# Patient Record
Sex: Male | Born: 1955
Health system: Southern US, Community
[De-identification: ages and names within clinical notes are randomized; demographics above are authoritative.]

## PROBLEM LIST (undated history)

## (undated) DIAGNOSIS — K2270 Barrett's esophagus without dysplasia: Secondary | ICD-10-CM

## (undated) DIAGNOSIS — I10 Essential (primary) hypertension: Secondary | ICD-10-CM

## (undated) DIAGNOSIS — Z973 Presence of spectacles and contact lenses: Secondary | ICD-10-CM

## (undated) DIAGNOSIS — M898X9 Other specified disorders of bone, unspecified site: Secondary | ICD-10-CM

## (undated) DIAGNOSIS — C8230 Follicular lymphoma grade IIIa, unspecified site: Secondary | ICD-10-CM

## (undated) DIAGNOSIS — I2510 Atherosclerotic heart disease of native coronary artery without angina pectoris: Secondary | ICD-10-CM

## (undated) DIAGNOSIS — L570 Actinic keratosis: Secondary | ICD-10-CM

## (undated) DIAGNOSIS — G4700 Insomnia, unspecified: Secondary | ICD-10-CM

## (undated) DIAGNOSIS — R5383 Other fatigue: Secondary | ICD-10-CM

## (undated) DIAGNOSIS — R112 Nausea with vomiting, unspecified: Secondary | ICD-10-CM

## (undated) DIAGNOSIS — G971 Other reaction to spinal and lumbar puncture: Secondary | ICD-10-CM

## (undated) DIAGNOSIS — C801 Malignant (primary) neoplasm, unspecified: Secondary | ICD-10-CM

## (undated) DIAGNOSIS — K219 Gastro-esophageal reflux disease without esophagitis: Secondary | ICD-10-CM

## (undated) DIAGNOSIS — R51 Headache: Secondary | ICD-10-CM

## (undated) DIAGNOSIS — K5900 Constipation, unspecified: Secondary | ICD-10-CM

## (undated) HISTORY — DX: Other specified disorders of bone, unspecified site: M89.8X9

## (undated) HISTORY — DX: Follicular lymphoma grade IIIa, unspecified site: C82.30

## (undated) HISTORY — DX: Malignant (primary) neoplasm, unspecified: C80.1

## (undated) HISTORY — DX: Other reaction to spinal and lumbar puncture: G97.1

## (undated) HISTORY — DX: Headache: R51

## (undated) HISTORY — DX: Barrett's esophagus without dysplasia: K22.70

## (undated) HISTORY — DX: Nausea with vomiting, unspecified: R11.2

## (undated) HISTORY — PX: COLONOSCOPY: SHX174

## (undated) HISTORY — PX: APPENDECTOMY: SHX54

## (undated) HISTORY — DX: Constipation, unspecified: K59.00

## (undated) HISTORY — DX: Other fatigue: R53.83

## (undated) HISTORY — DX: Actinic keratosis: L57.0

## (undated) HISTORY — DX: Insomnia, unspecified: G47.00

## (undated) HISTORY — DX: Essential (primary) hypertension: I10

## (undated) HISTORY — DX: Atherosclerotic heart disease of native coronary artery without angina pectoris: I25.10

---

## 1999-06-26 ENCOUNTER — Emergency Department (HOSPITAL_COMMUNITY): Admission: EM | Admit: 1999-06-26 | Discharge: 1999-06-26 | Payer: Self-pay | Admitting: Emergency Medicine

## 2000-06-16 ENCOUNTER — Ambulatory Visit (HOSPITAL_COMMUNITY): Admission: RE | Admit: 2000-06-16 | Discharge: 2000-06-16 | Payer: Self-pay | Admitting: *Deleted

## 2011-10-07 ENCOUNTER — Emergency Department (HOSPITAL_COMMUNITY): Payer: No Typology Code available for payment source

## 2011-10-07 ENCOUNTER — Encounter: Payer: Self-pay | Admitting: Emergency Medicine

## 2011-10-07 ENCOUNTER — Other Ambulatory Visit: Payer: Self-pay

## 2011-10-07 ENCOUNTER — Emergency Department (HOSPITAL_COMMUNITY)
Admission: EM | Admit: 2011-10-07 | Discharge: 2011-10-07 | Disposition: A | Discharge status: Home / Self Care | Payer: No Typology Code available for payment source | Attending: Emergency Medicine | Admitting: Emergency Medicine

## 2011-10-07 DIAGNOSIS — R071 Chest pain on breathing: Secondary | ICD-10-CM | POA: Insufficient documentation

## 2011-10-07 DIAGNOSIS — S2239XA Fracture of one rib, unspecified side, initial encounter for closed fracture: Secondary | ICD-10-CM

## 2011-10-07 MED ORDER — IOHEXOL 300 MG/ML  SOLN
80.0000 mL | Freq: Once | INTRAMUSCULAR | Status: AC | PRN
  Administered 2011-10-07: 80 mL via INTRAVENOUS

## 2011-10-07 MED ORDER — OXYCODONE-ACETAMINOPHEN 5-325 MG PO TABS
ORAL_TABLET | ORAL | Status: AC
  Filled 2011-10-07: qty 1

## 2011-10-07 MED ORDER — OXYCODONE-ACETAMINOPHEN 5-325 MG PO TABS: 1.0000 | ORAL_TABLET | ORAL | Status: AC | PRN

## 2011-10-07 MED ORDER — IBUPROFEN 600 MG PO TABS: 600.0000 mg | ORAL_TABLET | Freq: Four times a day (QID) | ORAL | Status: AC | PRN

## 2011-10-07 MED ORDER — MORPHINE SULFATE 4 MG/ML IJ SOLN: 4.0000 mg | Freq: Once | INTRAMUSCULAR | Status: DC

## 2011-10-07 MED ORDER — OXYCODONE-ACETAMINOPHEN 5-325 MG PO TABS
1.0000 | ORAL_TABLET | Freq: Once | ORAL | Status: AC
  Administered 2011-10-07: 1 via ORAL

## 2011-10-07 NOTE — ED Notes (Signed)
Pt completed incentive spirometry correctly. Education given on same.  Pt also ambulated without difficulty in hallway.

## 2011-10-07 NOTE — ED Notes (Signed)
MD at bedside. 

## 2011-10-07 NOTE — ED Notes (Signed)
Pt returned from cat scan in no acute distress.
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
## 2011-10-07 NOTE — ED Notes (Signed)
Patient transported to CT 

## 2011-10-07 NOTE — ED Notes (Signed)
Ambulatory to check out desk with wife in no acute distress.
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
## 2011-10-07 NOTE — ED Notes (Signed)
Pt denies needs at this time. Aware of plan of care.  Resp are equal and unlabored. Family at bedside.  Awaiting consult.

## 2011-10-08 NOTE — ED Provider Notes (Signed)
I saw and evaluated the patient, reviewed the resident's note and I agree with the findings and plan.   Date: 10/08/2011  Rate: 78  Rhythm: normal sinus rhythm  QRS Axis: normal  Intervals: normal  ST/T Wave abnormalities: normal  Conduction Disutrbances:none  Narrative Interpretation:   Old EKG Reviewed: none available  +Lt cw ttp. No shortness of breath at rest or with ambulation. His oxygenation is 96-100% on room air. Patient has been given an incentive spirometer, pain medication. He is feeling better and ready for discharge home. Due to his slightly abnormal CT scan I did discuss with the trauma surgeon on call. Patient to be discharged.   Forbes Cellar, MD 10/08/11 [phone]

## 2014-05-09 ENCOUNTER — Other Ambulatory Visit: Payer: Self-pay | Admitting: Family Medicine

## 2014-05-09 DIAGNOSIS — R222 Localized swelling, mass and lump, trunk: Secondary | ICD-10-CM

## 2014-05-09 DIAGNOSIS — R221 Localized swelling, mass and lump, neck: Principal | ICD-10-CM

## 2014-05-09 DIAGNOSIS — R220 Localized swelling, mass and lump, head: Secondary | ICD-10-CM

## 2014-05-13 ENCOUNTER — Ambulatory Visit
Admission: RE | Admit: 2014-05-13 | Discharge: 2014-05-13 | Disposition: A | Discharge status: Home / Self Care | Payer: BC Managed Care – PPO | Source: Ambulatory Visit | Attending: Family Medicine | Admitting: Family Medicine

## 2014-05-13 DIAGNOSIS — R220 Localized swelling, mass and lump, head: Secondary | ICD-10-CM

## 2014-05-13 DIAGNOSIS — R221 Localized swelling, mass and lump, neck: Principal | ICD-10-CM

## 2014-05-13 DIAGNOSIS — R222 Localized swelling, mass and lump, trunk: Secondary | ICD-10-CM

## 2014-05-13 MED ORDER — IOHEXOL 300 MG/ML  SOLN
100.0000 mL | Freq: Once | INTRAMUSCULAR | Status: AC | PRN
  Administered 2014-05-13: 100 mL via INTRAVENOUS

## 2014-05-14 ENCOUNTER — Other Ambulatory Visit (HOSPITAL_COMMUNITY): Payer: Self-pay | Admitting: Family Medicine

## 2014-05-14 DIAGNOSIS — R222 Localized swelling, mass and lump, trunk: Secondary | ICD-10-CM

## 2014-05-16 ENCOUNTER — Other Ambulatory Visit: Payer: Self-pay | Admitting: Radiology

## 2014-05-19 ENCOUNTER — Encounter (HOSPITAL_COMMUNITY): Payer: Self-pay | Admitting: Pharmacy Technician

## 2014-05-19 ENCOUNTER — Encounter (HOSPITAL_COMMUNITY): Payer: Self-pay

## 2014-05-19 ENCOUNTER — Ambulatory Visit (HOSPITAL_COMMUNITY)
Admission: RE | Admit: 2014-05-19 | Discharge: 2014-05-19 | Disposition: A | Discharge status: Home / Self Care | Payer: BC Managed Care – PPO | Source: Ambulatory Visit | Attending: Family Medicine | Admitting: Family Medicine

## 2014-05-19 DIAGNOSIS — R222 Localized swelling, mass and lump, trunk: Secondary | ICD-10-CM | POA: Diagnosis present

## 2014-05-19 LAB — CBC
HCT: 43.1 % (ref 39.0–52.0)
Hemoglobin: 14.4 g/dL (ref 13.0–17.0)
MCH: 30.2 pg (ref 26.0–34.0)
MCHC: 33.4 g/dL (ref 30.0–36.0)
MCV: 90.4 fL (ref 78.0–100.0)
Platelets: 311 10*3/uL (ref 150–400)
RBC: 4.77 MIL/uL (ref 4.22–5.81)
RDW: 12.1 % (ref 11.5–15.5)
WBC: 6.3 10*3/uL (ref 4.0–10.5)

## 2014-05-19 LAB — PROTIME-INR
INR: 1.07 (ref 0.00–1.49)
Prothrombin Time: 13.9 seconds (ref 11.6–15.2)

## 2014-05-19 LAB — APTT: aPTT: 29 seconds (ref 24–37)

## 2014-05-19 MED ORDER — FENTANYL CITRATE 0.05 MG/ML IJ SOLN
INTRAMUSCULAR | Status: AC
  Filled 2014-05-19: qty 2

## 2014-05-19 MED ORDER — SODIUM CHLORIDE 0.9 % IV SOLN
INTRAVENOUS | Status: DC
  Administered 2014-05-19: 13:00:00 via INTRAVENOUS

## 2014-05-19 MED ORDER — FENTANYL CITRATE 0.05 MG/ML IJ SOLN
INTRAMUSCULAR | Status: AC | PRN
  Administered 2014-05-19: 50 ug via INTRAVENOUS

## 2014-05-19 MED ORDER — LIDOCAINE HCL (PF) 1 % IJ SOLN
INTRAMUSCULAR | Status: AC
  Filled 2014-05-19: qty 10

## 2014-05-19 MED ORDER — MIDAZOLAM HCL 2 MG/2ML IJ SOLN
INTRAMUSCULAR | Status: AC
  Filled 2014-05-19: qty 2

## 2014-05-19 MED ORDER — MIDAZOLAM HCL 2 MG/2ML IJ SOLN
INTRAMUSCULAR | Status: AC | PRN
  Administered 2014-05-19: 1 mg via INTRAVENOUS

## 2014-05-19 NOTE — Discharge Instructions (Signed)
Wound Care ° °HOME CARE  °· Only take medicine as told by your doctor. °· Clean the wound daily with mild soap and water. °· Change any bandages (dressings) as told by your doctor. °· Put medicated cream and a bandage on the wound as told by your doctor. °· Change the bandage if it gets wet, dirty, or starts to smell. °· Take showers. Do not take baths, swim, or do anything that puts your wound under water. °· Rest and raise (elevate) the wound until the pain and puffiness (swelling) are better. °· Keep all doctor visits as told. °GET HELP RIGHT AWAY IF:  °· Yellowish-white fluid (pus) comes from the wound. °· Medicine does not lessen your pain. °· There is a red streak going away from the wound. °· You have a fever. °MAKE SURE YOU:  °· Understand these instructions. °· Will watch your condition. °· Will get help right away if you are not doing well or get worse. °Document Released: 06/28/2008 Document Revised: 12/12/2011 Document Reviewed: 01/23/2011 °ExitCare® Patient Information ©2015 [employer]. This information is not intended to replace advice given to you by your health care provider. Make sure you discuss any questions you have with your health care provider. ° °

## 2014-05-19 NOTE — H&P (Signed)
Erik Crosby is an 58 y.o. male.   Chief Complaint:  Pt has noticed this area of swelling in Rt neck for almost 1 yr Has been evaluated by MD and CT recommended but had not moved forward with this. Mass is enlarging and has become painful 8/11 CT Neck and Chest shows mediastinal lymphadenopathy consistent with lymphoma with Right neck mass Now scheduled for biopsy of Rt neck mass  HPI: none  History reviewed. No pertinent past medical history.  Past Surgical History  Procedure Laterality Date  . Appendectomy      No family history on file. Social History:  has no tobacco, alcohol, and drug history on file.  Allergies: No Known Allergies   (Not in a hospital admission)  Results for orders placed during the hospital encounter of 05/19/14 (from the past 48 hour(s))  APTT     Status: None   Collection Time    05/19/14 12:42 PM      Result Value Ref Range   aPTT 29  24 - 37 seconds  CBC     Status: None   Collection Time    05/19/14 12:42 PM      Result Value Ref Range   WBC 6.3  4.0 - 10.5 K/uL   RBC 4.77  4.22 - 5.81 MIL/uL   Hemoglobin 14.4  13.0 - 17.0 g/dL   HCT 43.1  39.0 - 52.0 %   MCV 90.4  78.0 - 100.0 fL   MCH 30.2  26.0 - 34.0 pg   MCHC 33.4  30.0 - 36.0 g/dL   RDW 12.1  11.5 - 15.5 %   Platelets 311  150 - 400 K/uL  PROTIME-INR     Status: None   Collection Time    05/19/14 12:42 PM      Result Value Ref Range   Prothrombin Time 13.9  11.6 - 15.2 seconds   INR 1.07  0.00 - 1.49   No results found.  Review of Systems  Constitutional: Negative for fever, chills and weight loss.  Respiratory: Negative for shortness of breath.   Cardiovascular: Negative for chest pain.  Gastrointestinal: Negative for nausea, vomiting and abdominal pain.  Musculoskeletal: Positive for back pain and neck pain.  Neurological: Negative for dizziness, weakness and headaches.  Psychiatric/Behavioral: Negative for substance abuse.    Blood pressure 152/89, pulse  98, temperature 98.1 F (36.7 C), temperature source Oral, resp. rate 18, height 5\' 10"  (1.778 m), weight 77.565 kg (171 lb), SpO2 96.00%. Physical Exam  Constitutional: He is oriented to person, place, and time. He appears well-nourished.  Neck: Normal range of motion. Neck supple.  B palpable cervical nodes Large Rt sided neck mass Tender to palpate  Cardiovascular: Normal rate and regular rhythm.   No murmur heard. Respiratory: Effort normal and breath sounds normal. He has no wheezes.  GI: Soft. Bowel sounds are normal. There is no tenderness.  Musculoskeletal: Normal range of motion.  Lymphadenopathy:    He has cervical adenopathy.  Neurological: He is alert and oriented to person, place, and time.  Skin: Skin is warm and dry.  Psychiatric: He has a normal mood and affect. His behavior is normal. Judgment and thought content normal.     Assessment/Plan Rt neck mass - enlarging x 1 yr Now painful Abn CT chest and neck 05/13/14 Scheduled for biopsy of Rt neck mass Pt and wife aware of procedure benefits and risks and agreeable to proceed Consent signed and in chart  [place]  05/19/2014, 1:20 PM

## 2014-05-19 NOTE — H&P (Signed)
For biopsy of right neck mass today.

## 2014-05-19 NOTE — Procedures (Signed)
Procedure:  Right supraclavicular mass biopsy Findings:  Right [place] mass localized by ultrasound.  18 G core biopsy x 6.  Material submitted in saline. No complications.

## 2014-05-26 ENCOUNTER — Encounter (INDEPENDENT_AMBULATORY_CARE_PROVIDER_SITE_OTHER): Payer: Self-pay | Admitting: General Surgery

## 2014-05-26 ENCOUNTER — Ambulatory Visit (INDEPENDENT_AMBULATORY_CARE_PROVIDER_SITE_OTHER): Payer: BC Managed Care – PPO | Admitting: General Surgery

## 2014-05-26 VITALS — BP 148/84 | HR 81 | Temp 97.4°F | Ht 70.0 in | Wt 167.0 lb

## 2014-05-26 DIAGNOSIS — R591 Generalized enlarged lymph nodes: Secondary | ICD-10-CM

## 2014-05-26 DIAGNOSIS — R599 Enlarged lymph nodes, unspecified: Secondary | ICD-10-CM

## 2014-05-26 NOTE — Patient Instructions (Signed)
My office will call you to schedule the surgery.

## 2014-05-26 NOTE — Progress Notes (Signed)
Patient ID: Erik Crosby, male   DOB: 1956/05/23, 58 y.o.   MRN: [id number]  No chief complaint on file.   HPI Erik Crosby is a 58 y.o. male.   HPI  He is referred by Dr. Redmond Pulling because of lymphadenopathy. About one year ago, he noticed a small nodule in the right neck. His become significantly larger. CT scan has demonstrated bilateral supraclavicular as well his mediastinal adenopathy. A large core needle biopsy was performed but only necrotic tissue was noted.  He reports having night sweats as well decreased energy.  History reviewed. No pertinent past medical history.  Past Surgical History  Procedure Laterality Date  . Appendectomy      Family History  Problem Relation Age of Onset  . Cancer Mother   . Heart disease Father     Social History History  Substance Use Topics  . Smoking status: Never Smoker   . Smokeless tobacco: Not on file  . Alcohol Use: No    No Known Allergies  Current Outpatient Prescriptions  Medication Sig Dispense Refill  . ibuprofen (ADVIL,MOTRIN) 200 MG tablet Take 400 mg by mouth every 6 (six) hours as needed for headache or moderate pain.      . Probiotic Product (PROBIOTIC DAILY PO) Take 1 capsule by mouth daily.      . Turmeric 500 MG CAPS Take 500 mg by mouth daily.       No current facility-administered medications for this visit.    Review of Systems Review of Systems  Constitutional: Positive for fever and chills.  Respiratory: Positive for cough.   Cardiovascular: Positive for chest pain.  Neurological: Positive for weakness.  Hematological: Positive for adenopathy.    Blood pressure 148/84, pulse 81, temperature 97.4 F (36.3 C), height 5\' 10"  (1.778 m), weight 167 lb (75.751 kg).  Physical Exam Physical Exam  Constitutional: He appears well-developed and well-nourished. No distress.  HENT:  Head: Normocephalic and atraumatic.  Neck:  5 cm right supraclavicular mass with some surrounding ecchymosis.   Cardiovascular:  Increased rate  Pulmonary/Chest: Effort normal and breath sounds normal.  Abdominal: Soft. He exhibits no distension and no mass.  Musculoskeletal:  No axillary or inguinal adenopathy  Neurological: He is alert.  Skin: Skin is warm and dry.    Data Reviewed Note from radiology. Pathology report.  Assessment    Neck adenopathy. This as well as his clinical symptoms are suspicious for lymphoma.     Plan    Right supraclavicular lymph node biopsy. The procedure and risks were discussed with him. Risks include but not limited to bleeding, infection, wound healing problems, anesthesia, nerve injury. He seems to understand and would like to proceed.        Lori-Ann Lindfors J 05/26/2014, 10:56 AM

## 2014-06-02 ENCOUNTER — Encounter (HOSPITAL_BASED_OUTPATIENT_CLINIC_OR_DEPARTMENT_OTHER): Payer: Self-pay | Admitting: *Deleted

## 2014-06-02 ENCOUNTER — Encounter (HOSPITAL_BASED_OUTPATIENT_CLINIC_OR_DEPARTMENT_OTHER)
Admission: RE | Admit: 2014-06-02 | Discharge: 2014-06-02 | Disposition: A | Discharge status: Home / Self Care | Payer: BC Managed Care – PPO | Source: Ambulatory Visit | Attending: General Surgery | Admitting: General Surgery

## 2014-06-02 DIAGNOSIS — Z01818 Encounter for other preprocedural examination: Secondary | ICD-10-CM | POA: Insufficient documentation

## 2014-06-02 DIAGNOSIS — R599 Enlarged lymph nodes, unspecified: Secondary | ICD-10-CM | POA: Insufficient documentation

## 2014-06-02 LAB — CBC WITH DIFFERENTIAL/PLATELET
Basophils Absolute: 0 10*3/uL (ref 0.0–0.1)
Basophils Relative: 1 % (ref 0–1)
Eosinophils Absolute: 0.2 10*3/uL (ref 0.0–0.7)
Eosinophils Relative: 3 % (ref 0–5)
HCT: 42.4 % (ref 39.0–52.0)
Hemoglobin: 14.5 g/dL (ref 13.0–17.0)
Lymphocytes Relative: 17 % (ref 12–46)
Lymphs Abs: 0.9 10*3/uL (ref 0.7–4.0)
MCH: 29.9 pg (ref 26.0–34.0)
MCHC: 34.2 g/dL (ref 30.0–36.0)
MCV: 87.4 fL (ref 78.0–100.0)
Monocytes Absolute: 0.5 10*3/uL (ref 0.1–1.0)
Monocytes Relative: 9 % (ref 3–12)
Neutro Abs: 3.7 10*3/uL (ref 1.7–7.7)
Neutrophils Relative %: 70 % (ref 43–77)
Platelets: 203 10*3/uL (ref 150–400)
RBC: 4.85 MIL/uL (ref 4.22–5.81)
RDW: 12.5 % (ref 11.5–15.5)
WBC: 5.3 10*3/uL (ref 4.0–10.5)

## 2014-06-02 LAB — COMPREHENSIVE METABOLIC PANEL
ALT: 27 U/L (ref 0–53)
AST: 23 U/L (ref 0–37)
Albumin: 3.9 g/dL (ref 3.5–5.2)
Alkaline Phosphatase: 75 U/L (ref 39–117)
Anion gap: 12 (ref 5–15)
BUN: 12 mg/dL (ref 6–23)
CO2: 28 mEq/L (ref 19–32)
Calcium: 9.5 mg/dL (ref 8.4–10.5)
Chloride: 98 mEq/L (ref 96–112)
Creatinine, Ser: 0.97 mg/dL (ref 0.50–1.35)
GFR calc Af Amer: >90 mL/min (ref >90)
GFR calc non Af Amer: 90 mL/min — ABNORMAL LOW (ref >90)
Glucose, Bld: 85 mg/dL (ref 70–99)
Potassium: 4.4 mEq/L (ref 3.7–5.3)
Sodium: 138 mEq/L (ref 137–147)
Total Bilirubin: 0.3 mg/dL (ref 0.3–1.2)
Total Protein: 7.3 g/dL (ref 6.0–8.3)

## 2014-06-02 NOTE — Progress Notes (Signed)
Pt had cbc no diff-and pt-will come in for cmp cbc diff

## 2014-06-03 ENCOUNTER — Encounter (HOSPITAL_BASED_OUTPATIENT_CLINIC_OR_DEPARTMENT_OTHER): Payer: BC Managed Care – PPO | Admitting: Anesthesiology

## 2014-06-03 ENCOUNTER — Ambulatory Visit (HOSPITAL_BASED_OUTPATIENT_CLINIC_OR_DEPARTMENT_OTHER)
Admission: RE | Admit: 2014-06-03 | Discharge: 2014-06-03 | Disposition: A | Discharge status: Home / Self Care | Payer: BC Managed Care – PPO | Source: Ambulatory Visit | Attending: General Surgery | Admitting: General Surgery

## 2014-06-03 ENCOUNTER — Encounter (HOSPITAL_BASED_OUTPATIENT_CLINIC_OR_DEPARTMENT_OTHER)
Admission: RE | Disposition: A | Discharge status: Home / Self Care | Payer: Self-pay | Source: Ambulatory Visit | Attending: General Surgery

## 2014-06-03 ENCOUNTER — Ambulatory Visit (HOSPITAL_BASED_OUTPATIENT_CLINIC_OR_DEPARTMENT_OTHER): Payer: BC Managed Care – PPO | Admitting: Anesthesiology

## 2014-06-03 ENCOUNTER — Encounter (HOSPITAL_BASED_OUTPATIENT_CLINIC_OR_DEPARTMENT_OTHER): Payer: Self-pay | Admitting: Anesthesiology

## 2014-06-03 DIAGNOSIS — C8589 Other specified types of non-Hodgkin lymphoma, extranodal and solid organ sites: Secondary | ICD-10-CM | POA: Diagnosis not present

## 2014-06-03 DIAGNOSIS — R599 Enlarged lymph nodes, unspecified: Secondary | ICD-10-CM | POA: Diagnosis present

## 2014-06-03 HISTORY — DX: Presence of spectacles and contact lenses: Z97.3

## 2014-06-03 HISTORY — PX: LYMPH NODE BIOPSY: SHX201

## 2014-06-03 HISTORY — DX: Gastro-esophageal reflux disease without esophagitis: K21.9

## 2014-06-03 LAB — POCT HEMOGLOBIN-HEMACUE: Hemoglobin: 15.6 g/dL (ref 13.0–17.0)

## 2014-06-03 SURGERY — LYMPH NODE BIOPSY
Anesthesia: General | Laterality: Right

## 2014-06-03 MED ORDER — CEFAZOLIN SODIUM-DEXTROSE 2-3 GM-% IV SOLR
INTRAVENOUS | Status: AC
  Filled 2014-06-03: qty 50

## 2014-06-03 MED ORDER — FENTANYL CITRATE 0.05 MG/ML IJ SOLN
25.0000 ug | INTRAMUSCULAR | Status: DC | PRN
  Administered 2014-06-03: 50 ug via INTRAVENOUS
  Administered 2014-06-03: 25 ug via INTRAVENOUS

## 2014-06-03 MED ORDER — FENTANYL CITRATE 0.05 MG/ML IJ SOLN
INTRAMUSCULAR | Status: AC
  Filled 2014-06-03: qty 6

## 2014-06-03 MED ORDER — LIDOCAINE HCL (CARDIAC) 20 MG/ML IV SOLN
INTRAVENOUS | Status: DC | PRN
  Administered 2014-06-03: 50 mg via INTRAVENOUS

## 2014-06-03 MED ORDER — FENTANYL CITRATE 0.05 MG/ML IJ SOLN: 50.0000 ug | INTRAMUSCULAR | Status: DC | PRN

## 2014-06-03 MED ORDER — OXYCODONE HCL 5 MG PO TABS
ORAL_TABLET | ORAL | Status: AC
  Filled 2014-06-03: qty 1

## 2014-06-03 MED ORDER — PROPOFOL 10 MG/ML IV BOLUS
INTRAVENOUS | Status: DC | PRN
  Administered 2014-06-03: 300 mg via INTRAVENOUS

## 2014-06-03 MED ORDER — ONDANSETRON HCL 4 MG/2ML IJ SOLN
INTRAMUSCULAR | Status: DC | PRN
  Administered 2014-06-03: 4 mg via INTRAVENOUS

## 2014-06-03 MED ORDER — BUPIVACAINE HCL (PF) 0.5 % IJ SOLN
INTRAMUSCULAR | Status: AC
  Filled 2014-06-03: qty 30

## 2014-06-03 MED ORDER — OXYCODONE HCL 5 MG/5ML PO SOLN: 5.0000 mg | Freq: Once | ORAL | Status: AC | PRN

## 2014-06-03 MED ORDER — MIDAZOLAM HCL 5 MG/5ML IJ SOLN
INTRAMUSCULAR | Status: DC | PRN
  Administered 2014-06-03: 2 mg via INTRAVENOUS

## 2014-06-03 MED ORDER — LACTATED RINGERS IV SOLN
INTRAVENOUS | Status: DC
  Administered 2014-06-03: 10:00:00 via INTRAVENOUS

## 2014-06-03 MED ORDER — DEXAMETHASONE SODIUM PHOSPHATE 4 MG/ML IJ SOLN
INTRAMUSCULAR | Status: DC | PRN
  Administered 2014-06-03: 10 mg via INTRAVENOUS

## 2014-06-03 MED ORDER — MIDAZOLAM HCL 2 MG/2ML IJ SOLN
INTRAMUSCULAR | Status: AC
  Filled 2014-06-03: qty 2

## 2014-06-03 MED ORDER — PROMETHAZINE HCL 25 MG/ML IJ SOLN: 6.2500 mg | INTRAMUSCULAR | Status: DC | PRN

## 2014-06-03 MED ORDER — OXYCODONE HCL 5 MG PO TABS
5.0000 mg | ORAL_TABLET | Freq: Once | ORAL | Status: AC | PRN
  Administered 2014-06-03: 5 mg via ORAL

## 2014-06-03 MED ORDER — BUPIVACAINE HCL (PF) 0.5 % IJ SOLN
INTRAMUSCULAR | Status: DC | PRN
  Administered 2014-06-03: 3 mL

## 2014-06-03 MED ORDER — HYDROCODONE-ACETAMINOPHEN 5-325 MG PO TABS: 1.0000 | ORAL_TABLET | ORAL | Status: DC | PRN

## 2014-06-03 MED ORDER — FENTANYL CITRATE 0.05 MG/ML IJ SOLN
INTRAMUSCULAR | Status: AC
  Filled 2014-06-03: qty 2

## 2014-06-03 MED ORDER — CEFAZOLIN SODIUM-DEXTROSE 2-3 GM-% IV SOLR
2.0000 g | INTRAVENOUS | Status: AC
  Administered 2014-06-03: 2 g via INTRAVENOUS

## 2014-06-03 MED ORDER — FENTANYL CITRATE 0.05 MG/ML IJ SOLN
INTRAMUSCULAR | Status: DC | PRN
  Administered 2014-06-03: 50 ug via INTRAVENOUS
  Administered 2014-06-03: 100 ug via INTRAVENOUS
  Administered 2014-06-03: 50 ug via INTRAVENOUS

## 2014-06-03 MED ORDER — MIDAZOLAM HCL 2 MG/2ML IJ SOLN: 1.0000 mg | INTRAMUSCULAR | Status: DC | PRN

## 2014-06-03 SURGICAL SUPPLY — 52 items
APPLIER CLIP 9.375 MED OPEN (MISCELLANEOUS) ×2
BENZOIN TINCTURE PRP APPL 2/3 (GAUZE/BANDAGES/DRESSINGS) ×2 IMPLANT
BLADE CLIPPER SURG (BLADE) IMPLANT
BLADE SURG 15 STRL LF DISP TIS (BLADE) ×1 IMPLANT
BLADE SURG 15 STRL SS (BLADE) ×1
CANISTER SUCT 1200ML W/VALVE (MISCELLANEOUS) IMPLANT
CHLORAPREP W/TINT 26ML (MISCELLANEOUS) ×2 IMPLANT
CLEANER CAUTERY TIP 5X5 PAD (MISCELLANEOUS) IMPLANT
CLIP APPLIE 9.375 MED OPEN (MISCELLANEOUS) ×1 IMPLANT
CLIP TI MEDIUM 6 (CLIP) IMPLANT
CLIP TI WIDE RED SMALL 6 (CLIP) IMPLANT
COVER MAYO STAND STRL (DRAPES) ×2 IMPLANT
COVER TABLE BACK 60X90 (DRAPES) ×2 IMPLANT
DECANTER SPIKE VIAL GLASS SM (MISCELLANEOUS) ×2 IMPLANT
DRAIN JP 10F RND SILICONE (MISCELLANEOUS) IMPLANT
DRAPE PED LAPAROTOMY (DRAPES) ×2 IMPLANT
DRAPE UTILITY XL STRL (DRAPES) ×2 IMPLANT
DRSG TEGADERM 4X4.75 (GAUZE/BANDAGES/DRESSINGS) ×2 IMPLANT
ELECT REM PT RETURN 9FT ADLT (ELECTROSURGICAL) ×2
ELECTRODE REM PT RTRN 9FT ADLT (ELECTROSURGICAL) ×1 IMPLANT
EVACUATOR SILICONE 100CC (DRAIN) IMPLANT
GLOVE BIOGEL PI IND STRL 7.0 (GLOVE) ×1 IMPLANT
GLOVE BIOGEL PI IND STRL 8.5 (GLOVE) ×1 IMPLANT
GLOVE BIOGEL PI INDICATOR 7.0 (GLOVE) ×1
GLOVE BIOGEL PI INDICATOR 8.5 (GLOVE) ×1
GLOVE ECLIPSE 7.0 STRL STRAW (GLOVE) ×2 IMPLANT
GLOVE ECLIPSE 8.0 STRL XLNG CF (GLOVE) ×2 IMPLANT
GOWN STRL REUS W/ TWL LRG LVL3 (GOWN DISPOSABLE) ×2 IMPLANT
GOWN STRL REUS W/TWL LRG LVL3 (GOWN DISPOSABLE) ×2
NEEDLE HYPO 25X1 1.5 SAFETY (NEEDLE) ×2 IMPLANT
NS IRRIG 1000ML POUR BTL (IV SOLUTION) IMPLANT
PACK BASIN DAY SURGERY FS (CUSTOM PROCEDURE TRAY) ×2 IMPLANT
PAD CLEANER CAUTERY TIP 5X5 (MISCELLANEOUS)
PENCIL BUTTON HOLSTER BLD 10FT (ELECTRODE) ×2 IMPLANT
SHEET MEDIUM DRAPE 40X70 STRL (DRAPES) IMPLANT
SLEEVE SCD COMPRESS KNEE MED (MISCELLANEOUS) ×2 IMPLANT
SPONGE GAUZE 4X4 12PLY STER LF (GAUZE/BANDAGES/DRESSINGS) ×2 IMPLANT
SPONGE LAP 4X18 X RAY DECT (DISPOSABLE) ×2 IMPLANT
STRIP CLOSURE SKIN 1/2X4 (GAUZE/BANDAGES/DRESSINGS) ×2 IMPLANT
SUT ETHILON 4 0 PC 5 (SUTURE) IMPLANT
SUT MON AB 4-0 PC3 18 (SUTURE) ×2 IMPLANT
SUT VIC AB 3-0 54X BRD REEL (SUTURE) IMPLANT
SUT VIC AB 3-0 BRD 54 (SUTURE)
SUT VIC AB 3-0 SH 27 (SUTURE) ×1
SUT VIC AB 3-0 SH 27X BRD (SUTURE) ×1 IMPLANT
SUT VICRYL 3-0 CR8 SH (SUTURE) IMPLANT
SUT VICRYL 4-0 PS2 18IN ABS (SUTURE) IMPLANT
SYR CONTROL 10ML LL (SYRINGE) ×2 IMPLANT
TOWEL OR 17X24 6PK STRL BLUE (TOWEL DISPOSABLE) ×4 IMPLANT
TOWEL OR NON WOVEN STRL DISP B (DISPOSABLE) ×2 IMPLANT
TUBE CONNECTING 20X1/4 (TUBING) ×2 IMPLANT
YANKAUER SUCT BULB TIP NO VENT (SUCTIONS) IMPLANT

## 2014-06-03 NOTE — Anesthesia Postprocedure Evaluation (Signed)
  Anesthesia Post-op Note  Patient: Erik Crosby  Procedure(s) Performed: Procedure(s): RIGHT SUPRACLAVICULAR LYMPH NODE BIOPSY (Right)  Patient Location: PACU  Anesthesia Type:General  Level of Consciousness: awake, alert  and oriented  Airway and Oxygen Therapy: Patient Spontanous Breathing  Post-op Pain: mild  Post-op Assessment: Post-op Vital signs reviewed  Post-op Vital Signs: Reviewed  Last Vitals:  Filed Vitals:   06/03/14 1215  BP: 157/78  Pulse: 95  Temp:   Resp: 17    Complications: No apparent anesthesia complications

## 2014-06-03 NOTE — H&P (View-Only) (Signed)
Patient ID: Erik Crosby, male   DOB: 1956-04-24, 58 y.o.   MRN: [id number]  No chief complaint on file.   HPI Erik Crosby is a 58 y.o. male.   HPI  Erik Crosby is referred by Dr. Redmond Pulling because of lymphadenopathy. About one year ago, Erik Crosby noticed a small nodule in the right neck. His become significantly larger. CT scan has demonstrated bilateral supraclavicular as well his mediastinal adenopathy. A large core needle biopsy was performed but only necrotic tissue was noted.  Erik Crosby reports having night sweats as well decreased energy.  History reviewed. No pertinent past medical history.  Past Surgical History  Procedure Laterality Date  . Appendectomy      Family History  Problem Relation Age of Onset  . Cancer Mother   . Heart disease Father     Social History History  Substance Use Topics  . Smoking status: Never Smoker   . Smokeless tobacco: Not on file  . Alcohol Use: No    No Known Allergies  Current Outpatient Prescriptions  Medication Sig Dispense Refill  . ibuprofen (ADVIL,MOTRIN) 200 MG tablet Take 400 mg by mouth every 6 (six) hours as needed for headache or moderate pain.      . Probiotic Product (PROBIOTIC DAILY PO) Take 1 capsule by mouth daily.      . Turmeric 500 MG CAPS Take 500 mg by mouth daily.       No current facility-administered medications for this visit.    Review of Systems Review of Systems  Constitutional: Positive for fever and chills.  Respiratory: Positive for cough.   Cardiovascular: Positive for chest pain.  Neurological: Positive for weakness.  Hematological: Positive for adenopathy.    Blood pressure 148/84, pulse 81, temperature 97.4 F (36.3 C), height 5\' 10"  (1.778 m), weight 167 lb (75.751 kg).  Physical Exam Physical Exam  Constitutional: Erik Crosby appears well-developed and well-nourished. No distress.  HENT:  Head: Normocephalic and atraumatic.  Neck:  5 cm right supraclavicular mass with some surrounding ecchymosis.   Cardiovascular:  Increased rate  Pulmonary/Chest: Effort normal and breath sounds normal.  Abdominal: Soft. Erik Crosby exhibits no distension and no mass.  Musculoskeletal:  No axillary or inguinal adenopathy  Neurological: Erik Crosby is alert.  Skin: Skin is warm and dry.    Data Reviewed Note from radiology. Pathology report.  Assessment    Neck adenopathy. This as well as his clinical symptoms are suspicious for lymphoma.     Plan    Right supraclavicular lymph node biopsy. The procedure and risks were discussed with him. Risks include but not limited to bleeding, infection, wound healing problems, anesthesia, nerve injury. Erik Crosby seems to understand and would like to proceed.        Colman Birdwell J 05/26/2014, 10:56 AM

## 2014-06-03 NOTE — Interval H&P Note (Signed)
History and Physical Interval Note:  06/03/2014 10:21 AM  [name]  has presented today for surgery, with the diagnosis of Right Supraclavicular lymphadenopathy  The various methods of treatment have been discussed with the patient and family. After consideration of risks, benefits and other options for treatment, the patient has consented to  Procedure(s): RIGHT SUPRACLAVICULAR LYMPH NODE BIOPSY (Right) as a surgical intervention .  The patient's history has been reviewed, patient examined, no change in status, stable for surgery.  I have reviewed the patient's chart and labs.  Questions were answered to the patient's satisfaction.     Erik Crosby Lenna Sciara

## 2014-06-03 NOTE — Op Note (Signed)
Operative Note  Erik Crosby male 58 y.o. 06/03/2014  PREOPERATIVE DX:  Right supraclavicular adenopathy  POSTOPERATIVE DX:  Same  PROCEDURE:  Right subclavicular lymph node biopsy         Surgeon: Odis Hollingshead   Assistants: none  Anesthesia: General LMA anesthesia  Indications: this is a 58 year old male with aggressive lymphadenopathy in the right supraclavicular region. He also has and adenopathy and mediastinal area. Large core needle biopsy did not give diagnostic tissue. He now presents for open biopsy.    Procedure Detail:  He was seen in the holding area and the right neck marked my initials. He was brought to the operative room placed supine on the operating table and general anesthetic was given. The right neck and chest wall areas were sterilely prepped and draped. A transverse incision was made in the lateral right neck through the skin subcutaneous tissues and first layer of muscle. I then carefully dissected through the sternocleidomastoid muscle. Identified an enlarged lymph node which was removed using electrocautery. There is a larger lymph node which I did an incisional biopsy on using electrocautery. Both of these specimens were placed into saline and sent fresh to pathology. Bleeding was controlled with electrocautery. Once hemostasis was adequate, the subcutaneous tissue and muscle layers were approximated with interrupted 3-0 Vicryl sutures. The skin was closed with a 4-0 Monocryl subcuticular stitch. Steri-Strips and a sterile dressing were applied.  He tolerated the procedure well without any apparent complications and was taken to the recovery room in satisfactory condition.  Estimated Blood Loss:  less than 100 mL         Drains: none  Blood Given: none          Specimens: right supraclavicular lymph node biopsies        Complications:  * No complications entered in OR log *         Disposition: PACU - hemodynamically stable.          Condition: stable

## 2014-06-03 NOTE — Transfer of Care (Signed)
Immediate Anesthesia Transfer of Care Note  Patient: Erik Crosby  Procedure(s) Performed: Procedure(s): RIGHT SUPRACLAVICULAR LYMPH NODE BIOPSY (Right)  Patient Location: PACU  Anesthesia Type:General  Level of Consciousness: awake, alert , oriented and patient cooperative  Airway & Oxygen Therapy: Patient Spontanous Breathing and Patient connected to nasal cannula oxygen  Post-op Assessment: Report given to PACU RN and Post -op Vital signs reviewed and stable  Post vital signs: Reviewed and stable  Complications: No apparent anesthesia complications

## 2014-06-03 NOTE — Anesthesia Preprocedure Evaluation (Addendum)
Anesthesia Evaluation  Patient identified by MRN, date of birth, ID band Patient awake    Reviewed: Allergy & Precautions, H&P , NPO status , Patient's Chart, lab work & pertinent test results  Airway Mallampati: I TM Distance: >3 FB Neck ROM: Full    Dental  (+) Teeth Intact, Dental Advisory Given   Pulmonary neg pulmonary ROS,  Multiple mediastinal lymphadenopathy. Denies SOB with exertion or sx's while lying flat. breath sounds clear to auscultation        Cardiovascular negative cardio ROS  Rhythm:Regular Rate:Normal     Neuro/Psych negative neurological ROS  negative psych ROS   GI/Hepatic negative GI ROS, Neg liver ROS,   Endo/Other  negative endocrine ROS  Renal/GU negative Renal ROS  negative genitourinary   Musculoskeletal negative musculoskeletal ROS (+)   Abdominal   Peds  Hematology negative hematology ROS (+)   Anesthesia Other Findings   Reproductive/Obstetrics negative OB ROS                          Anesthesia Physical Anesthesia Plan  ASA: II  Anesthesia Plan: General   Post-op Pain Management:    Induction: Intravenous  Airway Management Planned: LMA  Additional Equipment: None  Intra-op Plan:   Post-operative Plan:   Informed Consent: I have reviewed the patients History and Physical, chart, labs and discussed the procedure including the risks, benefits and alternatives for the proposed anesthesia with the patient or authorized representative who has indicated his/her understanding and acceptance.   Dental advisory given  Plan Discussed with: CRNA and Anesthesiologist  Anesthesia Plan Comments:        Anesthesia Quick Evaluation

## 2014-06-03 NOTE — Discharge Instructions (Addendum)
Stay upright for the next 6 hours.  Light activities for the next 3 days then resume normal activities as long as you do not have pain.  Liquids today, regular diet tomorrow.  Return to work in 2-3 days, when comfortable.  Apply ice to the area as much as possible for the next 2-3 days.  May shower tomorrow.  Remove bandage Friday and leave steri strips on until they fall off.  Call for heavy bleeding, severe swelling, signs of infection.  Return for office visit in 2-3 weeks.  Call [phone] to arrange for an appointment.   Post Anesthesia Home Care Instructions  Activity: Get plenty of rest for the remainder of the day. A responsible adult should stay with you for 24 hours following the procedure.  For the next 24 hours, DO NOT: -Drive a car -Paediatric nurse -Drink alcoholic beverages -Take any medication unless instructed by your physician -Make any legal decisions or sign important papers.  Meals: Start with liquid foods such as gelatin or soup. Progress to regular foods as tolerated. Avoid greasy, spicy, heavy foods. If nausea and/or vomiting occur, drink only clear liquids until the nausea and/or vomiting subsides. Call your physician if vomiting continues.  Special Instructions/Symptoms: Your throat may feel dry or sore from the anesthesia or the breathing tube placed in your throat during surgery. If this causes discomfort, gargle with warm salt water. The discomfort should disappear within 24 hours.

## 2014-06-03 NOTE — Anesthesia Procedure Notes (Signed)
Procedure Name: LMA Insertion Date/Time: 06/03/2014 10:30 AM Performed by: Melynda Ripple D Pre-anesthesia Checklist: Patient identified, Emergency Drugs available, Suction available and Patient being monitored Patient Re-evaluated:Patient Re-evaluated prior to inductionOxygen Delivery Method: Circle System Utilized Preoxygenation: Pre-oxygenation with 100% oxygen Intubation Type: IV induction Ventilation: Mask ventilation without difficulty LMA: LMA inserted LMA Size: 5.0 Number of attempts: 1 Airway Equipment and Method: bite block Placement Confirmation: positive ETCO2 Tube secured with: Tape Dental Injury: Teeth and Oropharynx as per pre-operative assessment

## 2014-06-04 ENCOUNTER — Encounter (HOSPITAL_BASED_OUTPATIENT_CLINIC_OR_DEPARTMENT_OTHER): Payer: Self-pay | Admitting: General Surgery

## 2014-06-05 ENCOUNTER — Ambulatory Visit (INDEPENDENT_AMBULATORY_CARE_PROVIDER_SITE_OTHER): Payer: BC Managed Care – PPO | Admitting: General Surgery

## 2014-06-06 ENCOUNTER — Encounter (INDEPENDENT_AMBULATORY_CARE_PROVIDER_SITE_OTHER): Payer: Self-pay | Admitting: General Surgery

## 2014-06-06 NOTE — Progress Notes (Unsigned)
Patient ID: Erik Crosby, male   DOB: 04/05/1956, 58 y.o.   MRN: [id number] His pathology comes back consistent with a B-cell non-Hodgkin's lymphoma. I have discussed this with him. I recommended a referral to medical oncology and he is in agreement with that. We will get that placed. I did briefly discuss with him that they may want him to have a Port-A-Cath.  We can discuss that later.

## 2014-06-11 ENCOUNTER — Telehealth: Payer: Self-pay | Admitting: Hematology and Oncology

## 2014-06-11 NOTE — Telephone Encounter (Signed)
S/W PATIENT AND GAVE NP APPT FOR 09/10 @ 10:15 W/DR. Young.

## 2014-06-12 ENCOUNTER — Telehealth: Payer: Self-pay | Admitting: Hematology and Oncology

## 2014-06-12 ENCOUNTER — Ambulatory Visit (HOSPITAL_BASED_OUTPATIENT_CLINIC_OR_DEPARTMENT_OTHER): Payer: BC Managed Care – PPO | Admitting: Hematology and Oncology

## 2014-06-12 ENCOUNTER — Encounter: Payer: Self-pay | Admitting: Hematology and Oncology

## 2014-06-12 ENCOUNTER — Encounter (HOSPITAL_COMMUNITY): Payer: Self-pay | Admitting: Pharmacy Technician

## 2014-06-12 ENCOUNTER — Ambulatory Visit (HOSPITAL_BASED_OUTPATIENT_CLINIC_OR_DEPARTMENT_OTHER): Payer: BC Managed Care – PPO

## 2014-06-12 ENCOUNTER — Other Ambulatory Visit (INDEPENDENT_AMBULATORY_CARE_PROVIDER_SITE_OTHER): Payer: Self-pay | Admitting: General Surgery

## 2014-06-12 VITALS — BP 164/84 | HR 101 | Temp 98.2°F | Resp 19 | Ht 70.0 in | Wt 166.1 lb

## 2014-06-12 DIAGNOSIS — Z9189 Other specified personal risk factors, not elsewhere classified: Secondary | ICD-10-CM

## 2014-06-12 DIAGNOSIS — C8291 Follicular lymphoma, unspecified, lymph nodes of head, face, and neck: Secondary | ICD-10-CM

## 2014-06-12 DIAGNOSIS — C8238 Follicular lymphoma grade IIIa, lymph nodes of multiple sites: Secondary | ICD-10-CM | POA: Insufficient documentation

## 2014-06-12 DIAGNOSIS — C8230 Follicular lymphoma grade IIIa, unspecified site: Secondary | ICD-10-CM

## 2014-06-12 DIAGNOSIS — Z23 Encounter for immunization: Secondary | ICD-10-CM

## 2014-06-12 HISTORY — DX: Follicular lymphoma grade IIIa, unspecified site: C82.30

## 2014-06-12 LAB — CBC WITH DIFFERENTIAL/PLATELET
BASO%: 0.5 % (ref 0.0–2.0)
Basophils Absolute: 0 10*3/uL (ref 0.0–0.1)
EOS%: 3.1 % (ref 0.0–7.0)
Eosinophils Absolute: 0.2 10*3/uL (ref 0.0–0.5)
HCT: 44.8 % (ref 38.4–49.9)
HGB: 14.6 g/dL (ref 13.0–17.1)
LYMPH%: 10.5 % — ABNORMAL LOW (ref 14.0–49.0)
MCH: 29.1 pg (ref 27.2–33.4)
MCHC: 32.7 g/dL (ref 32.0–36.0)
MCV: 88.8 fL (ref 79.3–98.0)
MONO#: 0.4 10*3/uL (ref 0.1–0.9)
MONO%: 5.6 % (ref 0.0–14.0)
NEUT#: 5.1 10*3/uL (ref 1.5–6.5)
NEUT%: 80.3 % — ABNORMAL HIGH (ref 39.0–75.0)
Platelets: 176 10*3/uL (ref 140–400)
RBC: 5.04 10*6/uL (ref 4.20–5.82)
RDW: 12.8 % (ref 11.0–14.6)
WBC: 6.3 10*3/uL (ref 4.0–10.3)
lymph#: 0.7 10*3/uL — ABNORMAL LOW (ref 0.9–3.3)

## 2014-06-12 LAB — COMPREHENSIVE METABOLIC PANEL (CC13)
ALT: 48 U/L (ref 0–55)
AST: 27 U/L (ref 5–34)
Albumin: 3.8 g/dL (ref 3.5–5.0)
Alkaline Phosphatase: 87 U/L (ref 40–150)
Anion Gap: 10 mEq/L (ref 3–11)
BUN: 13.9 mg/dL (ref 7.0–26.0)
CO2: 26 mEq/L (ref 22–29)
Calcium: 9.2 mg/dL (ref 8.4–10.4)
Chloride: 101 mEq/L (ref 98–109)
Creatinine: 1 mg/dL (ref 0.7–1.3)
Glucose: 163 mg/dl — ABNORMAL HIGH (ref 70–140)
Potassium: 3.9 mEq/L (ref 3.5–5.1)
Sodium: 137 mEq/L (ref 136–145)
Total Bilirubin: 0.39 mg/dL (ref 0.20–1.20)
Total Protein: 7 g/dL (ref 6.4–8.3)

## 2014-06-12 LAB — LACTATE DEHYDROGENASE (CC13): LDH: 199 U/L (ref 125–245)

## 2014-06-12 LAB — URIC ACID (CC13): Uric Acid, Serum: 5.1 mg/dl (ref 2.6–7.4)

## 2014-06-12 LAB — HEPATITIS B CORE ANTIBODY, IGM: Hep B C IgM: NONREACTIVE

## 2014-06-12 LAB — HEPATITIS B SURFACE ANTIBODY,QUALITATIVE: Hep B S Ab: NEGATIVE

## 2014-06-12 LAB — HEPATITIS B SURFACE ANTIGEN: Hepatitis B Surface Ag: NEGATIVE

## 2014-06-12 MED ORDER — INFLUENZA VAC SPLIT QUAD 0.5 ML IM SUSY
0.5000 mL | PREFILLED_SYRINGE | Freq: Once | INTRAMUSCULAR | Status: AC
  Administered 2014-06-12: 0.5 mL via INTRAMUSCULAR
  Filled 2014-06-12: qty 0.5

## 2014-06-12 MED ORDER — ALLOPURINOL 300 MG PO TABS: 300.0000 mg | ORAL_TABLET | Freq: Every day | ORAL | Status: DC

## 2014-06-12 NOTE — Telephone Encounter (Signed)
gv adn printed appt sched and avs for pt for Sept...sed added tx...Marland KitchenMarland KitchenBurnee will call me and pt with [facility] appts

## 2014-06-12 NOTE — Telephone Encounter (Signed)
s.w. pt and advised on 9.14 echo....pt ok adn aware

## 2014-06-12 NOTE — Progress Notes (Signed)
Checked in new patient with no financial issues prior to seeing the dr. He has my card and will call if any asst is needed. He has not been out of the country and he has appt crd,. fmla papers will be put in [facility]'s box and he wants call once done-he will pick up.

## 2014-06-12 NOTE — Assessment & Plan Note (Signed)
He is at high risk for tumor lysis. I will start him on allopurinol

## 2014-06-12 NOTE — Assessment & Plan Note (Signed)
I will stage him with PET/CT scan. I plan to review his case at the next Hematology tumor board and will order blood work, ECHO, port placement and chemo education class. Unfortunately, due to urgency of his situation, We will defer bone marrow biopsy to the future. I plan to see him back next to review everything with plan to start him on chemotherapy with [facility] next week.

## 2014-06-12 NOTE — Progress Notes (Signed)
[facility] NOTE  Patient Care Team: Christain Sacramento, MD as PCP - General (Family Medicine) Jackolyn Confer, MD as Referring Physician (General Surgery) Heath Lark, MD as Consulting Physician (Hematology and Oncology)  CHIEF COMPLAINTS/PURPOSE OF CONSULTATION:  Newly diagnosed follicular lymphoma  HISTORY OF PRESENTING ILLNESS:  Erik Crosby 58 y.o. male is here because of recent diagnosis of lymphoma. He has noticed swelling of right supraclavicular lymph nodes since last year. Over the past 3-4 months, he has gradual weight loss (approximately 12 pounds), drenching night sweats, skin itching and progressive shortness of breath with minimal exertions. He has noticed his neck lymph nodes are getting larger. Last month, he has substernal chest discomfort and nearly passed out at work. He saw his PCP who ordered multiple work-up as summarized below: Oncology History   Follicular lymphoma grade 3a   Primary site: Lymphoid Neoplasms   Staging method: AJCC 6th Edition   Clinical: Stage II   Summary: Stage II      Follicular lymphoma grade 3a   05/13/2014 Imaging CT scan of chest and neck showed extensive lymphadenopathy in the neck and medistinum   05/19/2014 Procedure [id number] US biopsy only showed necrotic tissue   06/03/2014 Surgery Excisional LN biopsy of right supraclavicular region showed follicular lymphoma grade 3 [id number]     MEDICAL HISTORY:  Past Medical History  Diagnosis Date  . Wears glasses   . GERD (gastroesophageal reflux disease)     occ-takes vinagar  . Cancer   . Follicular lymphoma grade 3a 06/12/2014    SURGICAL HISTORY: Past Surgical History  Procedure Laterality Date  . Appendectomy    . Colonoscopy    . Lymph node biopsy Right 06/03/2014    Procedure: RIGHT SUPRACLAVICULAR LYMPH NODE BIOPSY;  Surgeon: Odis Hollingshead, MD;  Location: [place];  Service: General;  Laterality: Right;    SOCIAL  HISTORY: History   Social History  . Marital Status: Married    Spouse Name: N/A    Number of Children: N/A  . Years of Education: N/A   Occupational History  . Not on file.   Social History Main Topics  . Smoking status: Never Smoker   . Smokeless tobacco: Never Used  . Alcohol Use: No  . Drug Use: No  . Sexual Activity: Not on file   Other Topics Concern  . Not on file   Social History Narrative  . No narrative on file    FAMILY HISTORY: Family History  Problem Relation Age of Onset  . Cancer Mother     colon ca & pancreatic ca  . Heart disease Father     ALLERGIES:  has No Known Allergies.  MEDICATIONS:  Current Outpatient Prescriptions  Medication Sig Dispense Refill  . ibuprofen (ADVIL,MOTRIN) 200 MG tablet Take 400 mg by mouth every 6 (six) hours as needed for headache or moderate pain.      . Melatonin 5 MG CAPS Take 10 mg by mouth at bedtime as needed (for sleep).       . Probiotic Product (PROBIOTIC DAILY PO) Take 1 capsule by mouth daily.      . Turmeric 500 MG CAPS Take 500 mg by mouth daily.      Marland Kitchen allopurinol (ZYLOPRIM) 300 MG tablet Take 1 tablet (300 mg total) by mouth daily.  30 tablet  0   No current facility-administered medications for this visit.    REVIEW OF SYSTEMS:   Constitutional: Denies fevers, chills  Eyes: Denies blurriness of vision, double vision or watery eyes Ears, nose, mouth, throat, and face: Denies mucositis or sore throat Gastrointestinal:  Denies nausea, heartburn or change in bowel habits Skin: Denies abnormal skin rashes Neurological:Denies numbness, tingling or new weaknesses Behavioral/Psych: Mood is stable, no new changes  All other systems were reviewed with the patient and are negative.  PHYSICAL EXAMINATION: ECOG PERFORMANCE STATUS: 1 - Symptomatic but completely ambulatory  Filed Vitals:   06/12/14 1043  BP: 164/84  Pulse: 101  Temp: 98.2 F (36.8 C)  Resp: 19   Filed Weights   06/12/14 1043  Weight:  166 lb 1.6 oz (75.342 kg)    GENERAL:alert, no distress and comfortable SKIN: skin color, texture, turgor are normal, no rashes or significant lesions EYES: normal, conjunctiva are pink and non-injected, sclera clear OROPHARYNX:no exudate, no erythema and lips, buccal mucosa, and tongue normal  NECK: supple, palpable lymphadenopathy in his right neck and right supraclavicular region LYMPH:  no palpable lymphadenopathy in the axillary or inguinal LUNGS: clear to auscultation and percussion with normal breathing effort HEART: regular rate & rhythm and no murmurs and no lower extremity edema ABDOMEN:abdomen soft, non-tender and normal bowel sounds Musculoskeletal:no cyanosis of digits and no clubbing  PSYCH: alert & oriented x 3 with fluent speech NEURO: no focal motor/sensory deficits  LABORATORY DATA:  I have reviewed the data as listed Lab Results  Component Value Date   WBC 6.3 06/12/2014   HGB 14.6 06/12/2014   HCT 44.8 06/12/2014   MCV 88.8 06/12/2014   PLT 176 06/12/2014    Recent Labs  06/02/14 1350 06/12/14 1323  NA 138 137  K 4.4 3.9  CL 98  --   CO2 28 26  GLUCOSE 85 163*  BUN 12 13.9  CREATININE 0.97 1.0  CALCIUM 9.5 9.2  GFRNONAA 90*  --   GFRAA >90  --   PROT 7.3 7.0  ALBUMIN 3.9 3.8  AST 23 27  ALT 27 48  ALKPHOS 75 87  BILITOT 0.3 0.39    RADIOGRAPHIC STUDIES:I reviewed the CT with patient and his wife I have personally reviewed the radiological images as listed and agreed with the findings in the report. ASSESSMENT & PLAN:  At high risk of tumor lysis syndrome He is at high risk for tumor lysis. I will start him on allopurinol  Follicular lymphoma grade 3a I will stage him with PET/CT scan. I plan to review his case at the next Hematology tumor board and will order blood work, ECHO, port placement and chemo education class. Unfortunately, due to urgency of his situation, We will defer bone marrow biopsy to the future. I plan to see him back next to  review everything with plan to start him on chemotherapy with [facility] next week.    Orders Placed This Encounter  Procedures  . NM PET Image Initial (PI) Skull Base To Thigh    Standing Status: Future     Number of Occurrences:      Standing Expiration Date: 08/12/2015    Order Specific Question:  Reason for Exam (SYMPTOM  OR DIAGNOSIS REQUIRED)    Answer:  staging lymphoma    Order Specific Question:  Preferred imaging location?    Answer:  [facility]  . Hepatitis B core antibody, IgM    Standing Status: Future     Number of Occurrences: 1     Standing Expiration Date: 07/17/2015  . Hepatitis B surface antibody    Standing Status:  Future     Number of Occurrences: 1     Standing Expiration Date: 07/17/2015  . Hepatitis B surface antigen    Standing Status: Future     Number of Occurrences: 1     Standing Expiration Date: 07/17/2015  . Lactate dehydrogenase    Standing Status: Future     Number of Occurrences: 1     Standing Expiration Date: 07/17/2015  . Uric Acid    Standing Status: Future     Number of Occurrences: 1     Standing Expiration Date: 07/17/2015  . CBC with Differential    Standing Status: Standing     Number of Occurrences: 9     Standing Expiration Date: 06/13/2015  . Comprehensive metabolic panel    Standing Status: Standing     Number of Occurrences: 9     Standing Expiration Date: 06/13/2015  . Ambulatory referral to General Surgery    Referral Priority:  Urgent    Referral Type:  Surgical    Referral Reason:  Specialty Services Required    Referred to Provider:  Jackolyn Confer, MD    Requested Specialty:  General Surgery    Number of Visits Requested:  1  . 2D Echocardiogram without contrast    Standing Status: Future     Number of Occurrences:      Standing Expiration Date: 06/12/2015    Order Specific Question:  Type of Echo    Answer:  Limited    Order Specific Question:  Reason for Exam    Answer:  Evaluate EF    Order Specific  Question:  Where should this test be performed    Answer:  [facility]    All questions were answered. The patient knows to call the clinic with any problems, questions or concerns. I spent 40 minutes counseling the patient face to face. The total time spent in the appointment was 60 minutes and more than 50% was on counseling.     [place], [place], MD 06/12/2014 8:16 PM

## 2014-06-13 ENCOUNTER — Encounter (HOSPITAL_COMMUNITY)
Admission: RE | Admit: 2014-06-13 | Discharge: 2014-06-13 | Disposition: A | Discharge status: Home / Self Care | Payer: BC Managed Care – PPO | Source: Ambulatory Visit | Attending: General Surgery | Admitting: General Surgery

## 2014-06-13 ENCOUNTER — Encounter (HOSPITAL_COMMUNITY): Payer: Self-pay

## 2014-06-13 ENCOUNTER — Other Ambulatory Visit: Payer: BC Managed Care – PPO

## 2014-06-13 DIAGNOSIS — Z01818 Encounter for other preprocedural examination: Secondary | ICD-10-CM | POA: Diagnosis present

## 2014-06-13 DIAGNOSIS — C8299 Follicular lymphoma, unspecified, extranodal and solid organ sites: Secondary | ICD-10-CM | POA: Diagnosis present

## 2014-06-13 NOTE — Pre-Procedure Instructions (Signed)
AMOUS [facility]WE  06/13/2014   Your procedure is scheduled on:  Tuesday, September 15th  Report to [facility] at [phone] AM.  Call this number if you have problems the morning of surgery: [phone]   Remember:   Do not eat food or drink liquids after midnight.   Take these medicines the morning of surgery with A SIP OF WATER: allopurinol   Do not wear jewelry.  Do not wear lotions, powders, or perfumes. You may wear deodorant.  Men may shave face and neck.  Do not bring valuables to the hospital.  [facility] is not responsible for any belongings or valuables.               Contacts, dentures or bridgework may not be worn into surgery.  Leave suitcase in the car. After surgery it may be brought to your room.  For patients admitted to the hospital, discharge time is determined by your treatment team.               Patients discharged the day of surgery will not be allowed to drive home.  Please read over the following fact sheets that you were given: Pain Booklet, Coughing and Deep Breathing and Surgical Site Infection Prevention [place] - Preparing for Surgery  Before surgery, you can play an important role.  Because skin is not sterile, your skin needs to be as free of germs as possible.  You can reduce the number of germs on you skin by washing with CHG (chlorahexidine gluconate) soap before surgery.  CHG is an antiseptic cleaner which kills germs and bonds with the skin to continue killing germs even after washing.  Please DO NOT use if you have an allergy to CHG or antibacterial soaps.  If your skin becomes reddened/irritated stop using the CHG and inform your nurse when you arrive at Short Stay.  Do not shave (including legs and underarms) for at least 48 hours prior to the first CHG shower.  You may shave your face.  Please follow these instructions carefully:   1.  Shower with CHG Soap the night before surgery and the morning of Surgery.  2.  If  you choose to wash your hair, wash your hair first as usual with your normal shampoo.  3.  After you shampoo, rinse your hair and body thoroughly to remove the shampoo.  4.  Use CHG as you would any other liquid soap.  You can apply CHG directly to the skin and wash gently with scrungie or a clean washcloth.  5.  Apply the CHG Soap to your body ONLY FROM THE NECK DOWN.  Do not use on open wounds or open sores.  Avoid contact with your eyes, ears, mouth and genitals (private parts).  Wash genitals (private parts) with your normal soap.  6.  Wash thoroughly, paying special attention to the area where your surgery will be performed.  7.  Thoroughly rinse your body with warm water from the neck down.  8.  DO NOT shower/wash with your normal soap after using and rinsing off the CHG Soap.  9.  Pat yourself dry with a clean towel.            10.  Wear clean pajamas.            11.  Place clean sheets on your bed the night of your first shower and do not sleep with pets.  Day of Surgery  Do not apply  any lotions/deoderants the morning of surgery.  Please wear clean clothes to the hospital/surgery center.

## 2014-06-14 ENCOUNTER — Encounter (HOSPITAL_COMMUNITY): Payer: Self-pay

## 2014-06-14 ENCOUNTER — Ambulatory Visit (HOSPITAL_COMMUNITY)
Admission: RE | Admit: 2014-06-14 | Discharge: 2014-06-14 | Disposition: A | Discharge status: Home / Self Care | Payer: BC Managed Care – PPO | Source: Ambulatory Visit | Attending: Hematology and Oncology | Admitting: Hematology and Oncology

## 2014-06-14 DIAGNOSIS — C8299 Follicular lymphoma, unspecified, extranodal and solid organ sites: Secondary | ICD-10-CM | POA: Insufficient documentation

## 2014-06-14 DIAGNOSIS — C8230 Follicular lymphoma grade IIIa, unspecified site: Secondary | ICD-10-CM

## 2014-06-14 LAB — GLUCOSE, CAPILLARY: Glucose-Capillary: 99 mg/dL (ref 70–99)

## 2014-06-14 MED ORDER — FLUDEOXYGLUCOSE F - 18 (FDG) INJECTION: 9.0700 | Freq: Once | INTRAVENOUS | Status: AC | PRN

## 2014-06-16 ENCOUNTER — Ambulatory Visit (HOSPITAL_COMMUNITY)
Admission: RE | Admit: 2014-06-16 | Discharge: 2014-06-16 | Disposition: A | Discharge status: Home / Self Care | Payer: BC Managed Care – PPO | Source: Ambulatory Visit | Attending: Hematology and Oncology | Admitting: Hematology and Oncology

## 2014-06-16 ENCOUNTER — Encounter: Payer: Self-pay | Admitting: Hematology and Oncology

## 2014-06-16 DIAGNOSIS — C8230 Follicular lymphoma grade IIIa, unspecified site: Secondary | ICD-10-CM

## 2014-06-16 DIAGNOSIS — Z01818 Encounter for other preprocedural examination: Secondary | ICD-10-CM | POA: Diagnosis not present

## 2014-06-16 DIAGNOSIS — I059 Rheumatic mitral valve disease, unspecified: Secondary | ICD-10-CM

## 2014-06-16 DIAGNOSIS — C8299 Follicular lymphoma, unspecified, extranodal and solid organ sites: Secondary | ICD-10-CM | POA: Insufficient documentation

## 2014-06-16 MED ORDER — CEFAZOLIN SODIUM-DEXTROSE 2-3 GM-% IV SOLR
2.0000 g | INTRAVENOUS | Status: AC
  Administered 2014-06-17: 2 g via INTRAVENOUS

## 2014-06-16 NOTE — Progress Notes (Signed)
Put disability and fmla form on nurse's desk. °

## 2014-06-16 NOTE — Progress Notes (Signed)
Echocardiogram 2D Echocardiogram has been performed.  Erik Crosby 06/16/2014, 12:08 PM

## 2014-06-17 ENCOUNTER — Ambulatory Visit (HOSPITAL_COMMUNITY): Payer: BC Managed Care – PPO | Admitting: Anesthesiology

## 2014-06-17 ENCOUNTER — Other Ambulatory Visit (HOSPITAL_COMMUNITY): Payer: BC Managed Care – PPO

## 2014-06-17 ENCOUNTER — Encounter (HOSPITAL_COMMUNITY): Payer: BC Managed Care – PPO | Admitting: Vascular Surgery

## 2014-06-17 ENCOUNTER — Ambulatory Visit (HOSPITAL_COMMUNITY): Payer: BC Managed Care – PPO

## 2014-06-17 ENCOUNTER — Encounter (HOSPITAL_COMMUNITY): Payer: Self-pay | Admitting: Anesthesiology

## 2014-06-17 ENCOUNTER — Encounter: Payer: Self-pay | Admitting: Hematology and Oncology

## 2014-06-17 ENCOUNTER — Ambulatory Visit (HOSPITAL_COMMUNITY)
Admission: RE | Admit: 2014-06-17 | Discharge: 2014-06-17 | Disposition: A | Discharge status: Home / Self Care | Payer: BC Managed Care – PPO | Source: Ambulatory Visit | Attending: General Surgery | Admitting: General Surgery

## 2014-06-17 ENCOUNTER — Encounter (HOSPITAL_COMMUNITY)
Admission: RE | Disposition: A | Discharge status: Home / Self Care | Payer: Self-pay | Source: Ambulatory Visit | Attending: General Surgery

## 2014-06-17 DIAGNOSIS — C8291 Follicular lymphoma, unspecified, lymph nodes of head, face, and neck: Secondary | ICD-10-CM | POA: Insufficient documentation

## 2014-06-17 DIAGNOSIS — K219 Gastro-esophageal reflux disease without esophagitis: Secondary | ICD-10-CM | POA: Insufficient documentation

## 2014-06-17 HISTORY — PX: PORTACATH PLACEMENT: SHX2246

## 2014-06-17 SURGERY — INSERTION, TUNNELED CENTRAL VENOUS DEVICE, WITH PORT
Anesthesia: Monitor Anesthesia Care

## 2014-06-17 MED ORDER — HEPARIN SOD (PORK) LOCK FLUSH 100 UNIT/ML IV SOLN
INTRAVENOUS | Status: AC
  Filled 2014-06-17: qty 5

## 2014-06-17 MED ORDER — FENTANYL CITRATE 0.05 MG/ML IJ SOLN: 25.0000 ug | INTRAMUSCULAR | Status: DC | PRN

## 2014-06-17 MED ORDER — FENTANYL CITRATE 0.05 MG/ML IJ SOLN
INTRAMUSCULAR | Status: AC
  Filled 2014-06-17: qty 5

## 2014-06-17 MED ORDER — ACETAMINOPHEN 325 MG PO TABS: 650.0000 mg | ORAL_TABLET | ORAL | Status: DC | PRN

## 2014-06-17 MED ORDER — FENTANYL CITRATE 0.05 MG/ML IJ SOLN
INTRAMUSCULAR | Status: DC | PRN
Start: 2014-06-17 — End: 2014-06-17
  Administered 2014-06-17 (×5): 50 ug via INTRAVENOUS

## 2014-06-17 MED ORDER — PROMETHAZINE HCL 25 MG/ML IJ SOLN: 6.2500 mg | INTRAMUSCULAR | Status: DC | PRN

## 2014-06-17 MED ORDER — OXYCODONE HCL 5 MG PO TABS: 5.0000 mg | ORAL_TABLET | ORAL | Status: DC | PRN

## 2014-06-17 MED ORDER — HEPARIN SOD (PORK) LOCK FLUSH 100 UNIT/ML IV SOLN
INTRAVENOUS | Status: DC | PRN
  Administered 2014-06-17: 250 [IU] via INTRAVENOUS

## 2014-06-17 MED ORDER — LIDOCAINE HCL (CARDIAC) 20 MG/ML IV SOLN
INTRAVENOUS | Status: DC | PRN
  Administered 2014-06-17: 60 mg via INTRAVENOUS

## 2014-06-17 MED ORDER — ONDANSETRON HCL 4 MG/2ML IJ SOLN
INTRAMUSCULAR | Status: DC | PRN
  Administered 2014-06-17: 4 mg via INTRAVENOUS

## 2014-06-17 MED ORDER — LACTATED RINGERS IV SOLN
INTRAVENOUS | Status: DC
  Administered 2014-06-17: 09:00:00 via INTRAVENOUS

## 2014-06-17 MED ORDER — MIDAZOLAM HCL 2 MG/2ML IJ SOLN
INTRAMUSCULAR | Status: AC
  Filled 2014-06-17: qty 2

## 2014-06-17 MED ORDER — PROPOFOL 10 MG/ML IV BOLUS
INTRAVENOUS | Status: DC | PRN
  Administered 2014-06-17: 30 mg via INTRAVENOUS
  Administered 2014-06-17: 20 mg via INTRAVENOUS
  Administered 2014-06-17: 30 mg via INTRAVENOUS
  Administered 2014-06-17: 20 mg via INTRAVENOUS

## 2014-06-17 MED ORDER — LACTATED RINGERS IV SOLN
INTRAVENOUS | Status: DC | PRN
  Administered 2014-06-17: 09:00:00 via INTRAVENOUS

## 2014-06-17 MED ORDER — PROPOFOL 10 MG/ML IV BOLUS
INTRAVENOUS | Status: AC
  Filled 2014-06-17: qty 20

## 2014-06-17 MED ORDER — SODIUM CHLORIDE 0.9 % IR SOLN
Status: DC | PRN
  Administered 2014-06-17: 10:00:00

## 2014-06-17 MED ORDER — ACETAMINOPHEN 650 MG RE SUPP: 650.0000 mg | RECTAL | Status: DC | PRN

## 2014-06-17 MED ORDER — CEFAZOLIN SODIUM-DEXTROSE 2-3 GM-% IV SOLR
INTRAVENOUS | Status: AC
  Filled 2014-06-17: qty 50

## 2014-06-17 MED ORDER — LIDOCAINE HCL (PF) 1 % IJ SOLN
INTRAMUSCULAR | Status: AC
  Filled 2014-06-17: qty 30

## 2014-06-17 MED ORDER — LIDOCAINE HCL (PF) 1 % IJ SOLN
INTRAMUSCULAR | Status: DC | PRN
  Administered 2014-06-17: 14 mL via SUBCUTANEOUS

## 2014-06-17 MED ORDER — LIDOCAINE HCL (CARDIAC) 20 MG/ML IV SOLN
INTRAVENOUS | Status: AC
  Filled 2014-06-17: qty 5

## 2014-06-17 MED ORDER — MEPERIDINE HCL 25 MG/ML IJ SOLN: 6.2500 mg | INTRAMUSCULAR | Status: DC | PRN

## 2014-06-17 MED ORDER — MIDAZOLAM HCL 2 MG/2ML IJ SOLN
INTRAMUSCULAR | Status: DC | PRN
  Administered 2014-06-17: 2 mg via INTRAVENOUS

## 2014-06-17 MED ORDER — HYDROCODONE-ACETAMINOPHEN 5-325 MG PO TABS: 1.0000 | ORAL_TABLET | ORAL | Status: DC | PRN

## 2014-06-17 SURGICAL SUPPLY — 56 items
BAG DECANTER FOR FLEXI CONT (MISCELLANEOUS) ×2 IMPLANT
BENZOIN TINCTURE PRP APPL 2/3 (GAUZE/BANDAGES/DRESSINGS) ×2 IMPLANT
BLADE SURG 11 STRL SS (BLADE) ×2 IMPLANT
BLADE SURG 15 STRL LF DISP TIS (BLADE) ×1 IMPLANT
BLADE SURG 15 STRL SS (BLADE) ×1
CHLORAPREP W/TINT 26ML (MISCELLANEOUS) ×2 IMPLANT
COVER SURGICAL LIGHT HANDLE (MISCELLANEOUS) ×2 IMPLANT
COVER TRANSDUCER ULTRASND GEL (DRAPE) ×2 IMPLANT
CRADLE DONUT ADULT HEAD (MISCELLANEOUS) ×2 IMPLANT
DECANTER SPIKE VIAL GLASS SM (MISCELLANEOUS) ×2 IMPLANT
DRAPE C-ARM 42X72 X-RAY (DRAPES) ×2 IMPLANT
DRAPE LAPAROSCOPIC ABDOMINAL (DRAPES) ×2 IMPLANT
DRAPE UTILITY 15X26 W/TAPE STR (DRAPE) ×4 IMPLANT
DRSG TEGADERM 2-3/8X2-3/4 SM (GAUZE/BANDAGES/DRESSINGS) ×2 IMPLANT
DRSG TEGADERM 4X4.75 (GAUZE/BANDAGES/DRESSINGS) ×4 IMPLANT
ELECT CAUTERY BLADE 6.4 (BLADE) ×2 IMPLANT
ELECT REM PT RETURN 9FT ADLT (ELECTROSURGICAL) ×2
ELECTRODE REM PT RTRN 9FT ADLT (ELECTROSURGICAL) ×1 IMPLANT
GAUZE SPONGE 2X2 8PLY STRL LF (GAUZE/BANDAGES/DRESSINGS) ×1 IMPLANT
GAUZE SPONGE 4X4 16PLY XRAY LF (GAUZE/BANDAGES/DRESSINGS) ×2 IMPLANT
GLOVE BIOGEL PI IND STRL 8 (GLOVE) ×1 IMPLANT
GLOVE BIOGEL PI INDICATOR 8 (GLOVE) ×1
GLOVE ECLIPSE 8.0 STRL XLNG CF (GLOVE) ×4 IMPLANT
GOWN STRL REUS W/ TWL LRG LVL3 (GOWN DISPOSABLE) ×2 IMPLANT
GOWN STRL REUS W/TWL LRG LVL3 (GOWN DISPOSABLE) ×2
INTRODUCER 13FR (MISCELLANEOUS) IMPLANT
INTRODUCER COOK 11FR (CATHETERS) IMPLANT
KIT BASIN OR (CUSTOM PROCEDURE TRAY) ×2 IMPLANT
KIT PORT POWER 8FR ISP CVUE (Catheter) ×2 IMPLANT
KIT PORT POWER 9.6FR MRI PREA (Catheter) IMPLANT
KIT PORT POWER ISP 8FR (Catheter) IMPLANT
KIT POWER CATH 8FR (Catheter) IMPLANT
KIT ROOM TURNOVER OR (KITS) ×2 IMPLANT
NEEDLE 22X1 1/2 (OR ONLY) (NEEDLE) ×2 IMPLANT
NEEDLE HYPO 25GX1X1/2 BEV (NEEDLE) ×2 IMPLANT
NS IRRIG 1000ML POUR BTL (IV SOLUTION) ×2 IMPLANT
PACK SURGICAL SETUP 50X90 (CUSTOM PROCEDURE TRAY) ×2 IMPLANT
PAD ARMBOARD 7.5X6 YLW CONV (MISCELLANEOUS) ×4 IMPLANT
PENCIL BUTTON HOLSTER BLD 10FT (ELECTRODE) ×2 IMPLANT
SET INTRODUCER 12FR PACEMAKER (SHEATH) IMPLANT
SET SHEATH INTRODUCER 10FR (MISCELLANEOUS) IMPLANT
SHEATH COOK PEEL AWAY SET 9F (SHEATH) IMPLANT
SPONGE GAUZE 2X2 STER 10/PKG (GAUZE/BANDAGES/DRESSINGS) ×1
STRIP CLOSURE SKIN 1/2X4 (GAUZE/BANDAGES/DRESSINGS) ×2 IMPLANT
STRIP CLOSURE SKIN 1/4X4 (GAUZE/BANDAGES/DRESSINGS) ×2 IMPLANT
SUT MON AB 4-0 PC3 18 (SUTURE) ×2 IMPLANT
SUT VIC AB 2-0 SH 18 (SUTURE) ×4 IMPLANT
SUT VIC AB 3-0 SH 27 (SUTURE)
SUT VIC AB 3-0 SH 27X BRD (SUTURE) IMPLANT
SYR 20ML ECCENTRIC (SYRINGE) ×4 IMPLANT
SYR 5ML LUER SLIP (SYRINGE) ×2 IMPLANT
SYR CONTROL 10ML LL (SYRINGE) ×2 IMPLANT
TAPE CLOTH SURG 4X10 WHT LF (GAUZE/BANDAGES/DRESSINGS) ×2 IMPLANT
TOWEL OR 17X24 6PK STRL BLUE (TOWEL DISPOSABLE) ×2 IMPLANT
TOWEL OR 17X26 10 PK STRL BLUE (TOWEL DISPOSABLE) ×2 IMPLANT
WATER STERILE IRR 1000ML POUR (IV SOLUTION) IMPLANT

## 2014-06-17 NOTE — Op Note (Signed)
Preoperative diagnosis:  Lymphoma  Postoperative diagnosis:  Same  Procedure: Ultrasound-guided Port-A-Cath insertion into left subclavian vein under fluoroscopy.  Surgeon: Jackolyn Confer M.D.  Anesthesia: Local (Xylocaine) with MAC.  Indication:  This is a 58 year old male with a recent diagnosis of Lymphoma.  He needs long term venous access and presents for Port-a-cath insertion.  Technique: He was brought to the operating room, placed supine on the operating table, and intravenous sedation was given. A roll was placed under the back and the arms were tucked. Hair on the chest wall was clipped as was necessary. The upper chest wall and neck were sterilely prepped and draped.  His head was rotated to the right. Using the ultrasound, the left internal jugular vein was identified.  The vein was relatively small as it was compressed by some surrounding enlarged lymph nodes.  Using the ultrasound probe, identified the left subclavian vein which appeared to be widely patent.  Local anesthetic was infiltrated in the skin and subcutaneous tissue in the left infraclavicular area. A 16-gauge needle was used to cannulate the left subclavian vein. A wire was then threaded through the needle into the subclavian vein and down into the right heart under fluoroscopic guidance.  Local anesthetic was infiltrated into the left upper chest wall. A left upper chest wall incision was made and a pocket was created for the Portacath.  An incision was made around the wire in the left infraclavicular area. The catheter was then tunneled from the inferior chest wall incision up through the superior incision.  A dilator- introducer complex was placed over the wire into the superior vena cava. The dilator and wire were then removed and the catheter was threaded through the peel-away sheath introducer into the right heart. The introducer was then peeled away and removed. Under fluoroscopic guidance, the tip of the catheter  was then pulled back until it was at the junction of the superior vena cava and right atrium. The catheter was then connected to the port.  The port aspirated blood and flushed easily.  The port was then anchored to the chest wall with 2-0 Vicryl suture. Concentrated heparin solution was then placed into the port. The port and catheter position were then verified using fluoroscopy. The subcutaneous tissue was then closed over the port with running 3-0 Vicryl suture. The skin incisions were then closed with 4-0 Monocryl subcuticular stitches. Steri-Strips and sterile dressings were applied.  The procedure was well-tolerated without any apparent complications. He was taken to the recovery room in satisfactory condition where a portable chest x-ray is pending.

## 2014-06-17 NOTE — Discharge Instructions (Addendum)
Implanted [facility] Guide An implanted port is a type of central line that is placed under the skin. Central lines are used to provide IV access when treatment or nutrition needs to be given through a person's veins. Implanted ports are used for long-term IV access. An implanted port may be placed because:   You need IV medicine that would be irritating to the small veins in your hands or arms.   You need long-term IV medicines, such as antibiotics.   You need IV nutrition for a long period.   You need frequent blood draws for lab tests.   You need dialysis.  Implanted ports are usually placed in the chest area, but they can also be placed in the upper arm, the abdomen, or the leg. An implanted port has two main parts:   Reservoir. The reservoir is round and will appear as a small, raised area under your skin. The reservoir is the part where a needle is inserted to give medicines or draw blood.   Catheter. The catheter is a thin, flexible tube that extends from the reservoir. The catheter is placed into a large vein. Medicine that is inserted into the reservoir goes into the catheter and then into the vein.  HOW WILL I CARE FOR MY INCISION SITE? You shower the day after the surgery.  Remove the bandage in 3 days.  Leave the steri strips on the wounds. HOW IS MY PORT ACCESSED? Special steps must be taken to access the port:   Before the port is accessed, a numbing cream can be placed on the skin. This helps numb the skin over the port site.   Your health care provider uses a sterile technique to access the port.  Your health care provider must put on a mask and sterile gloves.  The skin over your port is cleaned carefully with an antiseptic and allowed to dry.  The port is gently pinched between sterile gloves, and a needle is inserted into the port.  Only "non-coring" port needles should be used to access the port. Once the port is accessed, a blood return should be checked.  This helps ensure that the port is in the vein and is not clogged.   If your port needs to remain accessed for a constant infusion, a clear (transparent) bandage will be placed over the needle site. The bandage and needle will need to be changed every week, or as directed by your health care provider.   Keep the bandage covering the needle clean and dry. Do not get it wet. Follow your health care provider's instructions on how to take a shower or bath while the port is accessed.   If your port does not need to stay accessed, no bandage is needed over the port.  WHAT IS FLUSHING? Flushing helps keep the port from getting clogged. Follow your health care provider's instructions on how and when to flush the port. Ports are usually flushed with saline solution or a medicine called heparin. The need for flushing will depend on how the port is used.   If the port is used for intermittent medicines or blood draws, the port will need to be flushed:   After medicines have been given.   After blood has been drawn.   As part of routine maintenance.   If a constant infusion is running, the port may not need to be flushed.  HOW LONG WILL MY PORT STAY IMPLANTED? The port can stay in for as long  as your health care provider thinks it is needed. When it is time for the port to come out, surgery will be done to remove it. The procedure is similar to the one performed when the port was put in.  WHEN SHOULD I SEEK IMMEDIATE MEDICAL CARE? When you have an implanted port, you should seek immediate medical care if:   You notice a bad smell coming from the incision site.   You have swelling, redness, or drainage at the incision site.   You have more swelling or pain at the port site or the surrounding area.   You have a fever that is not controlled with medicine. Document Released: 09/19/2005 Document Revised: 07/10/2013 Document Reviewed: 05/27/2013 [facility] Patient Information 2015 [place],  [place]. This information is not intended to replace advice given to you by your health care provider. Make sure you discuss any questions you have with your health care provider.   What to eat:  For your first meals, you should eat lightly; only small meals initially.  If you do not have nausea, you may eat larger meals.  Avoid spicy, greasy and heavy food.    General Anesthesia, Adult, Care After  Refer to this sheet in the next few weeks. These instructions provide you with information on caring for yourself after your procedure. Your health care provider may also give you more specific instructions. Your treatment has been planned according to current medical practices, but problems sometimes occur. Call your health care provider if you have any problems or questions after your procedure.  WHAT TO EXPECT AFTER THE PROCEDURE  After the procedure, it is typical to experience:  Sleepiness.  Nausea and vomiting. HOME CARE INSTRUCTIONS  For the first 24 hours after general anesthesia:  Have a responsible person with you.  Do not drive a car. If you are alone, do not take public transportation.  Do not drink alcohol.  Do not take medicine that has not been prescribed by your health care provider.  Do not sign important papers or make important decisions.  You may resume a normal diet and activities as directed by your health care provider.  Change bandages (dressings) as directed.  If you have questions or problems that seem related to general anesthesia, call the hospital and ask for the anesthetist or anesthesiologist on call. SEEK MEDICAL CARE IF:  You have nausea and vomiting that continue the day after anesthesia.  You develop a rash. SEEK IMMEDIATE MEDICAL CARE IF:  You have difficulty breathing.  You have chest pain.  You have any allergic problems. Document Released: 12/26/2000 Document Revised: 05/22/2013 Document Reviewed: 04/04/2013  [facility] Patient Information 2014 [place],  [place].

## 2014-06-17 NOTE — Transfer of Care (Signed)
Immediate Anesthesia Transfer of Care Note  Patient: Erik Crosby  Procedure(s) Performed: Procedure(s): ULTRASOUND GUIDED PORT-A-CATH INSERTION (N/A)  Patient Location: PACU  Anesthesia Type:MAC  Level of Consciousness: awake, alert , oriented and patient cooperative  Airway & Oxygen Therapy: Patient Spontanous Breathing  Post-op Assessment: Report given to PACU RN, Post -op Vital signs reviewed and stable and Patient moving all extremities  Post vital signs: Reviewed and stable  Complications: No apparent anesthesia complications

## 2014-06-17 NOTE — Progress Notes (Signed)
Faxed disability form to [facility] @ [phone], faxed fmla form to [phone]

## 2014-06-17 NOTE — Anesthesia Postprocedure Evaluation (Signed)
  Anesthesia Post-op Note  Patient: Erik Crosby  Procedure(s) Performed: Procedure(s): ULTRASOUND GUIDED PORT-A-CATH INSERTION (N/A)  Patient Location: PACU  Anesthesia Type:MAC  Level of Consciousness: awake, alert , oriented and patient cooperative  Airway and Oxygen Therapy: Patient Spontanous Breathing  Post-op Pain: mild  Post-op Assessment: Post-op Vital signs reviewed, Patient's Cardiovascular Status Stable, Respiratory Function Stable, Patent Airway, No signs of Nausea or vomiting and Pain level controlled  Post-op Vital Signs: stable  Last Vitals:  Filed Vitals:   06/17/14 1125  BP:   Pulse:   Temp: 36.4 C  Resp:     Complications: No apparent anesthesia complications

## 2014-06-17 NOTE — H&P (Signed)
HELDER Crosby is an 58 y.o. male.   Chief Complaint:   Needs long term venous access HPI: Erik Crosby has a recent diagnosis of lymphoma and needs long term venous access for chemotherapy  Past Medical History  Diagnosis Date  . Wears glasses   . GERD (gastroesophageal reflux disease)     occ-takes vinagar  . Cancer   . Follicular lymphoma grade 3a 06/12/2014    Past Surgical History  Procedure Laterality Date  . Appendectomy    . Colonoscopy    . Lymph node biopsy Right 06/03/2014    Procedure: RIGHT SUPRACLAVICULAR LYMPH NODE BIOPSY;  Surgeon: Odis Hollingshead, MD;  Location: [place];  Service: General;  Laterality: Right;    Family History  Problem Relation Age of Onset  . Cancer Mother     colon ca & pancreatic ca  . Heart disease Father    Social History:  reports that Erik Crosby has never smoked. Erik Crosby has never used smokeless tobacco. Erik Crosby reports that Erik Crosby does not drink alcohol or use illicit drugs.  Allergies: No Known Allergies  Medications Prior to Admission  Medication Sig Dispense Refill  . allopurinol (ZYLOPRIM) 300 MG tablet Take 1 tablet (300 mg total) by mouth daily.  30 tablet  0  . ibuprofen (ADVIL,MOTRIN) 200 MG tablet Take 400 mg by mouth every 6 (six) hours as needed for headache or moderate pain.      . Melatonin 5 MG CAPS Take 10 mg by mouth at bedtime as needed (for sleep).       . Probiotic Product (PROBIOTIC DAILY PO) Take 1 capsule by mouth daily.      . Turmeric 500 MG CAPS Take 500 mg by mouth daily.        No results found for this or any previous visit (from the past 48 hour(s)). No results found.  Review of Systems  Constitutional: Negative for fever and chills.    Blood pressure 155/92, pulse 98, temperature 98.4 F (36.9 C), temperature source Oral, resp. rate 19, height 5\' 10"  (1.778 m), weight 167 lb 5 oz (75.892 kg), SpO2 100.00%. Physical Exam  Constitutional: Erik Crosby appears well-developed and well-nourished. No distress.  Neck:   Right neck incision is clean and intact.  Cardiovascular: Normal rate and regular rhythm.   Respiratory: Effort normal and breath sounds normal.  Neurological: Erik Crosby is alert.  Psychiatric: Erik Crosby has a normal mood and affect. His behavior is normal.     Assessment/Plan Newly diagnosed lymphoma  Plan:  US guided Port-a-cath insertion. The procedure risks and aftercare been explained. Risks include but are not limited to bleeding, infection, malfunction, pneumothorax, wound problems, DVT.  Erik Crosby J 06/17/2014, 9:14 AM

## 2014-06-17 NOTE — Anesthesia Preprocedure Evaluation (Addendum)
Anesthesia Evaluation  Patient identified by MRN, date of birth, ID band Patient awake    Reviewed: Allergy & Precautions, H&P , NPO status , Patient's Chart, lab work & pertinent test results  Airway       Dental   Pulmonary          Cardiovascular  ECHO 2015 EF 22% mild diastolic sys   Neuro/Psych    GI/Hepatic GERD-  ,  Endo/Other    Renal/GU      Musculoskeletal   Abdominal   Peds  Hematology   Anesthesia Other Findings Lymphoma  Reproductive/Obstetrics                         Anesthesia Physical Anesthesia Plan  ASA: II  Anesthesia Plan: MAC and General   Post-op Pain Management:    Induction: Intravenous  Airway Management Planned: LMA and Mask  Additional Equipment:   Intra-op Plan:   Post-operative Plan:   Informed Consent: I have reviewed the patients History and Physical, chart, labs and discussed the procedure including the risks, benefits and alternatives for the proposed anesthesia with the patient or authorized representative who has indicated his/her understanding and acceptance.     Plan Discussed with: CRNA, Anesthesiologist and Surgeon  Anesthesia Plan Comments: (LMA 5 resently)      Anesthesia Quick Evaluation

## 2014-06-18 ENCOUNTER — Telehealth: Payer: Self-pay | Admitting: Hematology and Oncology

## 2014-06-18 ENCOUNTER — Ambulatory Visit (HOSPITAL_BASED_OUTPATIENT_CLINIC_OR_DEPARTMENT_OTHER): Payer: BC Managed Care – PPO | Admitting: Hematology and Oncology

## 2014-06-18 ENCOUNTER — Encounter: Payer: Self-pay | Admitting: Hematology and Oncology

## 2014-06-18 VITALS — BP 169/87 | HR 107 | Temp 98.6°F | Resp 17 | Ht 70.0 in | Wt 168.1 lb

## 2014-06-18 DIAGNOSIS — G4700 Insomnia, unspecified: Secondary | ICD-10-CM

## 2014-06-18 DIAGNOSIS — Z9189 Other specified personal risk factors, not elsewhere classified: Secondary | ICD-10-CM

## 2014-06-18 DIAGNOSIS — C8230 Follicular lymphoma grade IIIa, unspecified site: Secondary | ICD-10-CM

## 2014-06-18 DIAGNOSIS — C8299 Follicular lymphoma, unspecified, extranodal and solid organ sites: Secondary | ICD-10-CM

## 2014-06-18 HISTORY — DX: Insomnia, unspecified: G47.00

## 2014-06-18 MED ORDER — PREDNISONE 20 MG PO TABS
60.0000 mg | ORAL_TABLET | Freq: Every day | ORAL | Status: DC
Start: 2014-06-18 — End: 2014-10-27

## 2014-06-18 MED ORDER — PROCHLORPERAZINE MALEATE 10 MG PO TABS: 10.0000 mg | ORAL_TABLET | Freq: Four times a day (QID) | ORAL | Status: DC | PRN

## 2014-06-18 MED ORDER — ZOLPIDEM TARTRATE 10 MG PO TABS: 10.0000 mg | ORAL_TABLET | Freq: Every evening | ORAL | Status: DC | PRN

## 2014-06-18 MED ORDER — LIDOCAINE-PRILOCAINE 2.5-2.5 % EX CREA: 1.0000 "application " | TOPICAL_CREAM | CUTANEOUS | Status: DC | PRN

## 2014-06-18 MED ORDER — ONDANSETRON HCL 8 MG PO TABS: 8.0000 mg | ORAL_TABLET | Freq: Three times a day (TID) | ORAL | Status: DC | PRN

## 2014-06-18 NOTE — Assessment & Plan Note (Signed)
We discussed the role of chemotherapy. The intent is for cure.  We discussed some of the risks, benefits and side-effects of Rituximab,Cytoxan, Adriamycin,Vincristine and Solumedrol/Prednisone along with prophylactic intrathecal chemotherapy.  Some of the short term side-effects included, though not limited to, risk of fatigue, weight loss, tumor lysis syndrome, risk of allergic reactions, pancytopenia, life-threatening infections, need for transfusions of blood products, nausea, vomiting, change in bowel habits, hair loss, risk of congestive heart failure, admission to hospital for various reasons, and risks of death.   Long term side-effects are also discussed including permanent damage to nerve function, chronic fatigue, and rare secondary malignancy including bone marrow disorders.   The patient is aware that the response rates discussed earlier is not guaranteed.    After a long discussion, patient made an informed decision to proceed with the prescribed plan of care.   Patient education material was dispensed. Due to high risk and high burden of disease, I will proceed to prescribe prednisone today in addition to his usual 4 days regimen after each cycle of chemotherapy.

## 2014-06-18 NOTE — Assessment & Plan Note (Signed)
He is at high risk for tumor lysis. He will continue on allopurinol

## 2014-06-18 NOTE — Telephone Encounter (Signed)
Pt confirmed labs/ov/inj per 09/16 POF, sent msg to add chemo, gave pt AVS......KJ

## 2014-06-18 NOTE — Progress Notes (Signed)
[place] OFFICE PROGRESS NOTE  Patient Care Team: Christain Sacramento, MD as PCP - General (Family Medicine) Jackolyn Confer, MD as Referring Physician (General Surgery) Heath Lark, MD as Consulting Physician (Hematology and Oncology)  SUMMARY OF ONCOLOGIC HISTORY:   Follicular lymphoma grade 3a   05/13/2014 Imaging CT scan of chest and neck showed extensive lymphadenopathy in the neck and medistinum   05/19/2014 Procedure [id number] US biopsy only showed necrotic tissue   06/03/2014 Surgery Excisional LN biopsy of right supraclavicular region showed follicular lymphoma grade 3 [id number]   06/14/2014 Imaging PET CT scan showed diffuse, bulky lymphadenopathy, splenic involvement and bone marrow disease   06/16/2014 Imaging ECHO showed preserved EF 50%   06/17/2014 Procedure He has port placement    INTERVAL HISTORY: Please see below for problem oriented charting. He feels well.  REVIEW OF SYSTEMS:   Constitutional: Denies fevers, chills or abnormal weight loss Eyes: Denies blurriness of vision Ears, nose, mouth, throat, and face: Denies mucositis or sore throat Respiratory: Denies cough, dyspnea or wheezes Cardiovascular: Denies palpitation, chest discomfort or lower extremity swelling Gastrointestinal:  Denies nausea, heartburn or change in bowel habits Skin: Denies abnormal skin rashes Lymphatics: Denies new lymphadenopathy or easy bruising Neurological:Denies numbness, tingling or new weaknesses Behavioral/Psych: Mood is stable, no new changes  All other systems were reviewed with the patient and are negative.  I have reviewed the past medical history, past surgical history, social history and family history with the patient and they are unchanged from previous note.  ALLERGIES:  has No Known Allergies.  MEDICATIONS:  Current Outpatient Prescriptions  Medication Sig Dispense Refill  . allopurinol (ZYLOPRIM) 300 MG tablet Take 1 tablet (300 mg total) by mouth daily.   30 tablet  0  . cholecalciferol (VITAMIN D) 1000 UNITS tablet Take 1,000 Units by mouth daily.      Marland Kitchen HYDROcodone-acetaminophen (NORCO/VICODIN) 5-325 MG per tablet Take 1-2 tablets by mouth every 4 (four) hours as needed for moderate pain or severe pain.  30 tablet  0  . Melatonin 5 MG CAPS Take 10 mg by mouth at bedtime as needed (for sleep).       . Probiotic Product (PROBIOTIC DAILY PO) Take 1 capsule by mouth daily.      . Turmeric 500 MG CAPS Take 500 mg by mouth daily.      Marland Kitchen ibuprofen (ADVIL,MOTRIN) 200 MG tablet Take 400 mg by mouth every 6 (six) hours as needed for headache or moderate pain.      Marland Kitchen lidocaine-prilocaine (EMLA) cream Apply 1 application topically as needed.  30 g  6  . ondansetron (ZOFRAN) 8 MG tablet Take 1 tablet (8 mg total) by mouth every 8 (eight) hours as needed for nausea.  30 tablet  1  . predniSONE (DELTASONE) 20 MG tablet Take 3 tablets (60 mg total) by mouth daily. Take on days 1-5 of chemotherapy.  60 tablet  6  . prochlorperazine (COMPAZINE) 10 MG tablet Take 1 tablet (10 mg total) by mouth every 6 (six) hours as needed (Nausea or vomiting).  30 tablet  6  . zolpidem (AMBIEN) 10 MG tablet Take 1 tablet (10 mg total) by mouth at bedtime as needed for sleep.  30 tablet  0   No current facility-administered medications for this visit.    PHYSICAL EXAMINATION: ECOG PERFORMANCE STATUS: 1 - Symptomatic but completely ambulatory  Filed Vitals:   06/18/14 1452  BP: 169/87  Pulse: 107  Temp: 98.6 F (37 C)  Resp: 17   Filed Weights   06/18/14 1452  Weight: 168 lb 1.6 oz (76.25 kg)    GENERAL:alert, no distress and comfortable SKIN: skin color, texture, turgor are normal, no rashes or significant lesions EYES: normal, Conjunctiva are pink and non-injected, sclera clear Musculoskeletal:no cyanosis of digits and no clubbing  NEURO: alert & oriented x 3 with fluent speech, no focal motor/sensory deficits  LABORATORY DATA:  I have reviewed the data as  listed    Component Value Date/Time   NA 137 06/12/2014 1323   NA 138 06/02/2014 1350   K 3.9 06/12/2014 1323   K 4.4 06/02/2014 1350   CL 98 06/02/2014 1350   CO2 26 06/12/2014 1323   CO2 28 06/02/2014 1350   GLUCOSE 163* 06/12/2014 1323   GLUCOSE 85 06/02/2014 1350   BUN 13.9 06/12/2014 1323   BUN 12 06/02/2014 1350   CREATININE 1.0 06/12/2014 1323   CREATININE 0.97 06/02/2014 1350   CALCIUM 9.2 06/12/2014 1323   CALCIUM 9.5 06/02/2014 1350   PROT 7.0 06/12/2014 1323   PROT 7.3 06/02/2014 1350   ALBUMIN 3.8 06/12/2014 1323   ALBUMIN 3.9 06/02/2014 1350   AST 27 06/12/2014 1323   AST 23 06/02/2014 1350   ALT 48 06/12/2014 1323   ALT 27 06/02/2014 1350   ALKPHOS 87 06/12/2014 1323   ALKPHOS 75 06/02/2014 1350   BILITOT 0.39 06/12/2014 1323   BILITOT 0.3 06/02/2014 1350   GFRNONAA 90* 06/02/2014 1350   GFRAA >90 06/02/2014 1350    No results found for this basename: SPEP, UPEP,  kappa and lambda light chains    Lab Results  Component Value Date   WBC 6.3 06/12/2014   NEUTROABS 5.1 06/12/2014   HGB 14.6 06/12/2014   HCT 44.8 06/12/2014   MCV 88.8 06/12/2014   PLT 176 06/12/2014      Chemistry      Component Value Date/Time   NA 137 06/12/2014 1323   NA 138 06/02/2014 1350   K 3.9 06/12/2014 1323   K 4.4 06/02/2014 1350   CL 98 06/02/2014 1350   CO2 26 06/12/2014 1323   CO2 28 06/02/2014 1350   BUN 13.9 06/12/2014 1323   BUN 12 06/02/2014 1350   CREATININE 1.0 06/12/2014 1323   CREATININE 0.97 06/02/2014 1350      Component Value Date/Time   CALCIUM 9.2 06/12/2014 1323   CALCIUM 9.5 06/02/2014 1350   ALKPHOS 87 06/12/2014 1323   ALKPHOS 75 06/02/2014 1350   AST 27 06/12/2014 1323   AST 23 06/02/2014 1350   ALT 48 06/12/2014 1323   ALT 27 06/02/2014 1350   BILITOT 0.39 06/12/2014 1323   BILITOT 0.3 06/02/2014 1350       RADIOGRAPHIC STUDIES:I have also reviewed his most recent PET scan with him and family I have personally reviewed the radiological images as listed and agreed with the findings in  the report. Chest Port 1 View  06/17/2014   CLINICAL DATA:  Post PAC, follicular lymphoma, GERD  EXAM: PORTABLE CHEST - 1 VIEW  COMPARISON:  05/09/2014  FINDINGS: Cardiomediastinal silhouette is stable. Right paratracheal soft tissue prominence again noted. There is left subclavian Port-A-Cath with tip in distal SVC. No pneumothorax. No acute infiltrate or pulmonary edema.  IMPRESSION: Right paratracheal/upper mediastinal soft tissue prominence again noted. Left subclavian Port-A-Cath in place. No pneumothorax.   Electronically Signed   By: Lahoma Crocker M.D.   On: 06/17/2014 11:08   Dg Fluoro Guide Cv Line-no  Report  06/17/2014   CLINICAL DATA: Insertion of Port-a-cath   FLOURO GUIDE CV LINE  Fluoroscopy was utilized by the requesting physician.  No radiographic  interpretation.      ASSESSMENT & PLAN:  Follicular lymphoma grade 3a We discussed the role of chemotherapy. The intent is for cure.  We discussed some of the risks, benefits and side-effects of Rituximab,Cytoxan, Adriamycin,Vincristine and Solumedrol/Prednisone along with prophylactic intrathecal chemotherapy.  Some of the short term side-effects included, though not limited to, risk of fatigue, weight loss, tumor lysis syndrome, risk of allergic reactions, pancytopenia, life-threatening infections, need for transfusions of blood products, nausea, vomiting, change in bowel habits, hair loss, risk of congestive heart failure, admission to hospital for various reasons, and risks of death.   Long term side-effects are also discussed including permanent damage to nerve function, chronic fatigue, and rare secondary malignancy including bone marrow disorders.   The patient is aware that the response rates discussed earlier is not guaranteed.    After a long discussion, patient made an informed decision to proceed with the prescribed plan of care.   Patient education material was dispensed. Due to high risk and high burden of disease, I will  proceed to prescribe prednisone today in addition to his usual 4 days regimen after each cycle of chemotherapy.    At high risk of tumor lysis syndrome He is at high risk for tumor lysis. He will continue on allopurinol    Insomnia I will prescribe Ambien   No orders of the defined types were placed in this encounter.   All questions were answered. The patient knows to call the clinic with any problems, questions or concerns. No barriers to learning was detected. I spent 55 minutes counseling the patient face to face. The total time spent in the appointment was 60 minutes and more than 50% was on counseling and review of test results     [facility], [place], MD 06/18/2014 8:09 PM

## 2014-06-18 NOTE — Assessment & Plan Note (Signed)
I will prescribe Ambien

## 2014-06-19 ENCOUNTER — Telehealth: Payer: Self-pay | Admitting: *Deleted

## 2014-06-19 ENCOUNTER — Encounter (HOSPITAL_COMMUNITY): Payer: Self-pay | Admitting: General Surgery

## 2014-06-19 ENCOUNTER — Telehealth: Payer: Self-pay | Admitting: Hematology and Oncology

## 2014-06-19 NOTE — Telephone Encounter (Signed)
Per staff message and POF I have scheduled appts. Advised scheduler of appts. JMW  

## 2014-06-19 NOTE — Telephone Encounter (Signed)
Confirmed pt aware his appt changed to 7:30 am tomorrow.  He is aware.

## 2014-06-19 NOTE — Telephone Encounter (Signed)
Called pt to confirmed apt for 09/18 treatment...KJ

## 2014-06-20 ENCOUNTER — Ambulatory Visit: Payer: BC Managed Care – PPO | Admitting: Hematology and Oncology

## 2014-06-20 ENCOUNTER — Encounter (HOSPITAL_COMMUNITY)
Admit: 2014-06-20 | Discharge: 2014-06-20 | Disposition: A | Discharge status: Home / Self Care | Payer: BC Managed Care – PPO | Source: Ambulatory Visit | Attending: Hematology and Oncology | Admitting: Hematology and Oncology

## 2014-06-20 ENCOUNTER — Other Ambulatory Visit: Payer: Self-pay | Admitting: *Deleted

## 2014-06-20 ENCOUNTER — Ambulatory Visit (HOSPITAL_BASED_OUTPATIENT_CLINIC_OR_DEPARTMENT_OTHER): Payer: BC Managed Care – PPO

## 2014-06-20 ENCOUNTER — Encounter: Payer: Self-pay | Admitting: Hematology and Oncology

## 2014-06-20 VITALS — BP 143/73 | HR 102 | Temp 98.9°F | Resp 17

## 2014-06-20 DIAGNOSIS — C8299 Follicular lymphoma, unspecified, extranodal and solid organ sites: Secondary | ICD-10-CM | POA: Diagnosis present

## 2014-06-20 DIAGNOSIS — Z5111 Encounter for antineoplastic chemotherapy: Secondary | ICD-10-CM

## 2014-06-20 DIAGNOSIS — C8230 Follicular lymphoma grade IIIa, unspecified site: Secondary | ICD-10-CM

## 2014-06-20 DIAGNOSIS — Z5112 Encounter for antineoplastic immunotherapy: Secondary | ICD-10-CM

## 2014-06-20 MED ORDER — VINCRISTINE SULFATE CHEMO INJECTION 1 MG/ML
2.0000 mg | Freq: Once | INTRAVENOUS | Status: AC
  Administered 2014-06-20: 2 mg via INTRAVENOUS
  Filled 2014-06-20: qty 2

## 2014-06-20 MED ORDER — ONDANSETRON 16 MG/50ML IVPB (CHCC)
INTRAVENOUS | Status: AC
  Filled 2014-06-20: qty 16

## 2014-06-20 MED ORDER — HEPARIN SOD (PORK) LOCK FLUSH 100 UNIT/ML IV SOLN
500.0000 [IU] | Freq: Once | INTRAVENOUS | Status: AC | PRN
  Administered 2014-06-20: 500 [IU]
  Filled 2014-06-20: qty 5

## 2014-06-20 MED ORDER — SODIUM CHLORIDE 0.9 % IJ SOLN
10.0000 mL | INTRAMUSCULAR | Status: DC | PRN
  Administered 2014-06-20: 10 mL
  Filled 2014-06-20: qty 10

## 2014-06-20 MED ORDER — ONDANSETRON 16 MG/50ML IVPB (CHCC)
16.0000 mg | Freq: Once | INTRAVENOUS | Status: AC
  Administered 2014-06-20: 16 mg via INTRAVENOUS

## 2014-06-20 MED ORDER — DEXAMETHASONE SODIUM PHOSPHATE 20 MG/5ML IJ SOLN
INTRAMUSCULAR | Status: AC
  Filled 2014-06-20: qty 5

## 2014-06-20 MED ORDER — DOXORUBICIN HCL CHEMO IV INJECTION 2 MG/ML
50.0000 mg/m2 | Freq: Once | INTRAVENOUS | Status: AC
  Administered 2014-06-20: 98 mg via INTRAVENOUS
  Filled 2014-06-20: qty 49

## 2014-06-20 MED ORDER — DIPHENHYDRAMINE HCL 25 MG PO CAPS
50.0000 mg | ORAL_CAPSULE | Freq: Once | ORAL | Status: AC
  Administered 2014-06-20: 50 mg via ORAL

## 2014-06-20 MED ORDER — SODIUM CHLORIDE 0.9 % IV SOLN
1000.0000 mL | INTRAVENOUS | Status: AC
  Administered 2014-06-20: 1000 mL via INTRAVENOUS

## 2014-06-20 MED ORDER — SODIUM CHLORIDE 0.9 % IV SOLN
Freq: Once | INTRAVENOUS | Status: AC
  Administered 2014-06-20: 08:00:00 via INTRAVENOUS

## 2014-06-20 MED ORDER — SODIUM CHLORIDE 0.9 % IV SOLN
375.0000 mg/m2 | Freq: Once | INTRAVENOUS | Status: AC
  Administered 2014-06-20: 700 mg via INTRAVENOUS
  Filled 2014-06-20: qty 70

## 2014-06-20 MED ORDER — ACETAMINOPHEN 325 MG PO TABS
ORAL_TABLET | ORAL | Status: AC
  Filled 2014-06-20: qty 2

## 2014-06-20 MED ORDER — SODIUM CHLORIDE 0.9 % IV SOLN
750.0000 mg/m2 | Freq: Once | INTRAVENOUS | Status: AC
  Administered 2014-06-20: 1460 mg via INTRAVENOUS
  Filled 2014-06-20: qty 73

## 2014-06-20 MED ORDER — DEXAMETHASONE SODIUM PHOSPHATE 20 MG/5ML IJ SOLN
20.0000 mg | Freq: Once | INTRAMUSCULAR | Status: AC
  Administered 2014-06-20: 20 mg via INTRAVENOUS

## 2014-06-20 MED ORDER — SODIUM CHLORIDE 0.9 % IJ SOLN
Freq: Once | INTRAMUSCULAR | Status: AC
  Administered 2014-06-20: 15:00:00 via INTRATHECAL
  Filled 2014-06-20: qty 0.48

## 2014-06-20 MED ORDER — ACETAMINOPHEN 325 MG PO TABS
650.0000 mg | ORAL_TABLET | Freq: Once | ORAL | Status: AC
  Administered 2014-06-20: 650 mg via ORAL

## 2014-06-20 MED ORDER — DIPHENHYDRAMINE HCL 25 MG PO CAPS
ORAL_CAPSULE | ORAL | Status: AC
  Filled 2014-06-20: qty 2

## 2014-06-20 NOTE — Progress Notes (Signed)
Updated MD on pt progress. Per MD, start ordered bolus at 1400, may Y-into Rituxan if still infusing. MD to be to pt's room at 1500 for scheduled procedure.

## 2014-06-20 NOTE — Progress Notes (Signed)
Diagnostic Lumbar Puncture and Intrathecal administration of chemotherapy Procedure Note   Informed consent was obtained and potential risks including bleeding, infection and pain were reviewed with the patient.  The patient's name, date of birth, identification, consent and allergies were verified prior to the start of procedure and time out was performed.  The skin was prepped with Betadine solution.   3 cc of 1% lidocaine was used to provide local anaesthesia.   The L3/L4 intrathecal space was chosen as the site of procedure.  5 cc of clear CSF was obtained.  Next, 5 ml/12 mg methotrexate was administered into the intrathecal space.  The procedure was tolerated well and there were no complications.  The patient was stable at the end of the procedure.  Specimens sent for cell count, protein and cytology to rule out lymphoma.

## 2014-06-20 NOTE — Patient Instructions (Addendum)
[facility] Discharge Instructions for Patients Receiving Chemotherapy  Today you received the following chemotherapy agents Adria, Cytoxan, Vincristine, Rituxan and Methotrexate.  To help prevent nausea and vomiting after your treatment, we encourage you to take your nausea medication take compazine every 6 hours as needed for nausea. Take Zofran every 8 hours as needed for nausea next dose due 4:30 pm   If you develop nausea and vomiting that is not controlled by your nausea medication, call the clinic.   BELOW ARE SYMPTOMS THAT SHOULD BE REPORTED IMMEDIATELY:  *FEVER GREATER THAN 100.5 F  *CHILLS WITH OR WITHOUT FEVER  NAUSEA AND VOMITING THAT IS NOT CONTROLLED WITH YOUR NAUSEA MEDICATION  *UNUSUAL SHORTNESS OF BREATH  *UNUSUAL BRUISING OR BLEEDING  TENDERNESS IN MOUTH AND THROAT WITH OR WITHOUT PRESENCE OF ULCERS  *URINARY PROBLEMS  *BOWEL PROBLEMS  UNUSUAL RASH Items with * indicate a potential emergency and should be followed up as soon as possible.  Feel free to call the clinic you have any questions or concerns. The clinic phone number is [phone].   Methotrexate injection What is this medicine? METHOTREXATE (METH oh TREX ate) is a chemotherapy drug. This medicine affects cells that are rapidly growing, such as cancer cells and cells in your mouth and stomach. It is used to treat many cancers and other medical conditions. It is used for leukemias, lymphomas, breast cancer, lung cancer, head and neck cancers, and other cancers. This medicine also works on the immune system and is commonly used to treat psoriasis and rheumatoid arthritis. This medicine may be used for other purposes; ask your health care provider or pharmacist if you have questions. What should I tell my health care provider before I take this medicine? They need to know if you have any of these conditions: -if you frequently drink alcohol containing drinks -infection (especially a  virus infection such as chickenpox, cold sores, or herpes) -immune system problems -kidney disease -liver disease -low blood counts, like platelets, red bloods, or white blood cells -lung disease -recent or ongoing radiation therapy -an unusual or allergic reaction to methotrexate, benzyl alcohol, other medicines, foods, dyes, or preservatives -pregnant or trying to get pregnant -breast-feeding How should I use this medicine? This drug is given as an injection into a muscle or into a vein. It may also be given into the spinal fluid. It is administered in a hospital or clinic by a specially trained health care professional. Talk to your pediatrician regarding the use of this medicine in children. While this drug may be prescribed for selected conditions, precautions do apply. Overdosage: If you think you have taken too much of this medicine contact a poison control center or emergency room at once. NOTE: This medicine is only for you. Do not share this medicine with others. What if I miss a dose? It is important not to miss your dose. Call your doctor or health care professional if you are unable to keep an appointment. What may interact with this medicine? -antibiotics and other medicines for infections -aspirin and aspirin-like medicines including bismuth subsalicylate (Pepto-Bismol) -cisplatin -dapsone -folic acid in supplements or vitamins -mercaptopurine -NSAIDs, medicines for pain and inflammation, like ibuprofen or naproxen -pemetrexed -phenylbutazone -phenytoin -probenecid -pyrimethamine -theophylline -trimetrexate -vaccines This list may not describe all possible interactions. Give your health care provider a list of all the medicines, herbs, non-prescription drugs, or dietary supplements you use. Also tell them if you smoke, drink alcohol, or use illegal drugs. Some items may interact  with your medicine. What should I watch for while using this medicine? Visit your doctor  for checks on your progress. You will need to have regular blood checks during your treatment to monitor your blood, liver function, and kidney function. This drug may make you feel generally unwell. This is not uncommon, as chemotherapy can affect healthy cells as well as cancer cells. Report any side effects. Continue your course of treatment even though you feel ill unless your doctor tells you to stop. In some cases, you may be given additional medicines to help with side effects. Follow all directions for their use. Call your doctor or health care professional for advice if you get a fever, chills or sore throat, or other symptoms of a cold or flu. Do not treat yourself. This drug decreases your body's ability to fight infections. Try to avoid being around people who are sick. This medicine may increase your risk to bruise or bleed. Call your doctor or health care professional if you notice any unusual bleeding. Be careful brushing and flossing your teeth or using a toothpick because you may get an infection or bleed more easily. If you have any dental work done, tell your dentist you are receiving this medicine. Avoid taking products that contain aspirin, acetaminophen, ibuprofen, naproxen, or ketoprofen unless instructed by your doctor. These medicines may hide a fever. This medicine can make you more sensitive to the sun. Keep out of the sun. If you cannot avoid being in the sun, wear protective clothing and use sunscreen. Do not use sun lamps or tanning beds/booths. Do not treat diarrhea with over the counter products. Contact your doctor if you have diarrhea. To protect your kidneys, drink water or other fluids as directed while you are taking this medicine. Do not drink alcohol-containing drinks while taking this medicine. Both alcohol and the medicine may cause damage to your liver. Men and women must use effective birth control while they are taking this medicine. Do not become pregnant while  taking this medicine. Women must continue using effective birth control for 1 full menstrual cycle after stopping this medicine. Tell your doctor right away if you think that you or your partner might be pregnant. There is a potential for serious side effects to an unborn child. Talk to your health care professional or pharmacist for more information. Do not breast-feed an infant while taking this medicine. Men must continue effective birth control for 3 months after stopping this medicine. What side effects may I notice from receiving this medicine? Side effects that you should report to your doctor or health care professional as soon as possible: -allergic reactions like skin rash, itching or hives, swelling of the face, lips, or tongue -low blood counts - this medicine may decrease the number of white blood cells, red blood cells and platelets. You may be at increased risk for infections and bleeding. -signs of infection - fever or chills, cough, sore throat, pain or difficulty passing urine -signs of decreased platelets or bleeding - bruising, pinpoint red spots on the skin, black, tarry stools, blood in the urine -signs of decreased red blood cells - unusually weak or tired, fainting spells, lightheadedness -breathing problems, like a dry cough -changes in vision -confusion, not alert -diarrhea -mouth or throat sores or ulcers -problems with balance, talking, walking -redness, blistering, peeling or loosening of the skin, including inside the mouth -seizures -trouble passing urine or change in the amount of urine -vomiting -yellowing of the eyes or skin Side  effects that usually do not require medical attention (report to your doctor or health care professional if they continue or are bothersome): -change in skin color -eye irritation -hair loss -headache -loss of appetite -nausea -stomach upset This list may not describe all possible side effects. Call your doctor for medical advice  about side effects. You may report side effects to FDA at 1-800-FDA-1088. Where should I keep my medicine? This drug is given in a hospital or clinic and will not be stored at home. NOTE: This sheet is a summary. It may not cover all possible information. If you have questions about this medicine, talk to your doctor, pharmacist, or health care provider.  2015, [employer]. (2008-03-27 11:13:24)

## 2014-06-21 ENCOUNTER — Ambulatory Visit (HOSPITAL_BASED_OUTPATIENT_CLINIC_OR_DEPARTMENT_OTHER): Payer: BC Managed Care – PPO

## 2014-06-21 VITALS — BP 176/80 | HR 83 | Temp 97.6°F | Resp 17

## 2014-06-21 DIAGNOSIS — C8230 Follicular lymphoma grade IIIa, unspecified site: Secondary | ICD-10-CM

## 2014-06-21 DIAGNOSIS — C8291 Follicular lymphoma, unspecified, lymph nodes of head, face, and neck: Secondary | ICD-10-CM

## 2014-06-21 DIAGNOSIS — Z5189 Encounter for other specified aftercare: Secondary | ICD-10-CM

## 2014-06-21 MED ORDER — PEGFILGRASTIM INJECTION 6 MG/0.6ML
6.0000 mg | Freq: Once | SUBCUTANEOUS | Status: AC
  Administered 2014-06-21: 6 mg via SUBCUTANEOUS

## 2014-06-21 NOTE — Patient Instructions (Signed)
Pegfilgrastim injection What is this medicine? PEGFILGRASTIM (peg fil GRA stim) is a long-acting granulocyte colony-stimulating factor that stimulates the growth of neutrophils, a type of white blood cell important in the body's fight against infection. It is used to reduce the incidence of fever and infection in patients with certain types of cancer who are receiving chemotherapy that affects the bone marrow. This medicine may be used for other purposes; ask your health care provider or pharmacist if you have questions. COMMON BRAND NAME(S): Neulasta What should I tell my health care provider before I take this medicine? They need to know if you have any of these conditions: -latex allergy -ongoing radiation therapy -sickle cell disease -skin reactions to acrylic adhesives (On-Body Injector only) -an unusual or allergic reaction to pegfilgrastim, filgrastim, other medicines, foods, dyes, or preservatives -pregnant or trying to get pregnant -breast-feeding How should I use this medicine? This medicine is for injection under the skin. If you get this medicine at home, you will be taught how to prepare and give the pre-filled syringe or how to use the On-body Injector. Refer to the patient Instructions for Use for detailed instructions. Use exactly as directed. Take your medicine at regular intervals. Do not take your medicine more often than directed. It is important that you put your used needles and syringes in a special sharps container. Do not put them in a trash can. If you do not have a sharps container, call your pharmacist or healthcare provider to get one. Talk to your pediatrician regarding the use of this medicine in children. Special care may be needed. Overdosage: If you think you have taken too much of this medicine contact a poison control center or emergency room at once. NOTE: This medicine is only for you. Do not share this medicine with others. What if I miss a dose? It is  important not to miss your dose. Call your doctor or health care professional if you miss your dose. If you miss a dose due to an On-body Injector failure or leakage, a new dose should be administered as soon as possible using a single prefilled syringe for manual use. What may interact with this medicine? Interactions have not been studied. Give your health care provider a list of all the medicines, herbs, non-prescription drugs, or dietary supplements you use. Also tell them if you smoke, drink alcohol, or use illegal drugs. Some items may interact with your medicine. This list may not describe all possible interactions. Give your health care provider a list of all the medicines, herbs, non-prescription drugs, or dietary supplements you use. Also tell them if you smoke, drink alcohol, or use illegal drugs. Some items may interact with your medicine. What should I watch for while using this medicine? You may need blood work done while you are taking this medicine. If you are going to need a MRI, CT scan, or other procedure, tell your doctor that you are using this medicine (On-Body Injector only). What side effects may I notice from receiving this medicine? Side effects that you should report to your doctor or health care professional as soon as possible: -allergic reactions like skin rash, itching or hives, swelling of the face, lips, or tongue -dizziness -fever -pain, redness, or irritation at site where injected -pinpoint red spots on the skin -shortness of breath or breathing problems -stomach or side pain, or pain at the shoulder -swelling -tiredness -trouble passing urine Side effects that usually do not require medical attention (report to your doctor   or health care professional if they continue or are bothersome): -bone pain -muscle pain This list may not describe all possible side effects. Call your doctor for medical advice about side effects. You may report side effects to FDA at  1-800-FDA-1088. Where should I keep my medicine? Keep out of the reach of children. Store pre-filled syringes in a refrigerator between 2 and 8 degrees C (36 and 46 degrees F). Do not freeze. Keep in carton to protect from light. Throw away this medicine if it is left out of the refrigerator for more than 48 hours. Throw away any unused medicine after the expiration date. NOTE: This sheet is a summary. It may not cover all possible information. If you have questions about this medicine, talk to your doctor, pharmacist, or health care provider.  2015, [employer]. (2013-12-19 16:14:05)  

## 2014-06-23 ENCOUNTER — Telehealth: Payer: Self-pay | Admitting: *Deleted

## 2014-06-23 ENCOUNTER — Encounter: Payer: Self-pay | Admitting: Hematology and Oncology

## 2014-06-23 ENCOUNTER — Other Ambulatory Visit: Payer: Self-pay | Admitting: Hematology and Oncology

## 2014-06-23 DIAGNOSIS — M898X9 Other specified disorders of bone, unspecified site: Secondary | ICD-10-CM

## 2014-06-23 DIAGNOSIS — R591 Generalized enlarged lymph nodes: Secondary | ICD-10-CM

## 2014-06-23 HISTORY — DX: Other specified disorders of bone, unspecified site: M89.8X9

## 2014-06-23 MED ORDER — OXYCODONE HCL 5 MG PO TABS
5.0000 mg | ORAL_TABLET | ORAL | Status: DC | PRN
Start: 2014-06-23 — End: 2014-07-11

## 2014-06-23 NOTE — Telephone Encounter (Signed)
I have called and gave the patient the new appts for Friday

## 2014-06-23 NOTE — Telephone Encounter (Signed)
Per staff messae I have moved appt from 10/7 to 10/9

## 2014-06-23 NOTE — Telephone Encounter (Signed)
Pt is requesting a refill of hydrocodone. States he had a severe headache on Sunday every time he stood up. Is not as bad today. Pt had neulasta on Saturday and chemo treatment Friday.

## 2014-06-24 ENCOUNTER — Encounter: Payer: Self-pay | Admitting: Hematology and Oncology

## 2014-06-24 LAB — CYTOLOGY, CSF

## 2014-06-24 NOTE — Progress Notes (Signed)
Put [employer] disability paper on nurse's desk

## 2014-06-25 ENCOUNTER — Telehealth: Payer: Self-pay | Admitting: *Deleted

## 2014-06-25 ENCOUNTER — Encounter: Payer: Self-pay | Admitting: Hematology and Oncology

## 2014-06-25 NOTE — Telephone Encounter (Signed)
Notified Dr. Alvy Bimler and she instructs for pt to increase fluid intake to 3 liters per day, ok to take Tylenol 1 gram TID and add caffeine if needed for headache.  Instructed pt on her instructions and to call us if headache gets worse. He verbalized understanding.

## 2014-06-25 NOTE — Progress Notes (Signed)
Faxed disability form to [employer] @ [phone]

## 2014-06-25 NOTE — Telephone Encounter (Signed)
Pt reports headache since Saturday pretty constant. States he is taking oxy ir prn but also wants to take tylenol if ok w/ Dr. Alvy Bimler.

## 2014-06-30 ENCOUNTER — Encounter: Payer: Self-pay | Admitting: Hematology and Oncology

## 2014-06-30 NOTE — Progress Notes (Signed)
Called and left the patient a message to call me back. [employer] advised of error on app to be corrected.(date)

## 2014-07-09 ENCOUNTER — Ambulatory Visit: Payer: BC Managed Care – PPO

## 2014-07-09 ENCOUNTER — Other Ambulatory Visit: Payer: Self-pay | Admitting: Hematology and Oncology

## 2014-07-11 ENCOUNTER — Encounter: Payer: Self-pay | Admitting: Hematology and Oncology

## 2014-07-11 ENCOUNTER — Other Ambulatory Visit (HOSPITAL_BASED_OUTPATIENT_CLINIC_OR_DEPARTMENT_OTHER): Payer: BC Managed Care – PPO

## 2014-07-11 ENCOUNTER — Ambulatory Visit (HOSPITAL_BASED_OUTPATIENT_CLINIC_OR_DEPARTMENT_OTHER): Payer: BC Managed Care – PPO

## 2014-07-11 ENCOUNTER — Ambulatory Visit (HOSPITAL_BASED_OUTPATIENT_CLINIC_OR_DEPARTMENT_OTHER): Payer: BC Managed Care – PPO | Admitting: Hematology and Oncology

## 2014-07-11 VITALS — BP 138/80 | HR 114 | Temp 98.1°F | Resp 18

## 2014-07-11 VITALS — BP 155/87 | HR 104 | Temp 98.2°F | Resp 19 | Ht 70.0 in | Wt 164.7 lb

## 2014-07-11 DIAGNOSIS — R51 Headache: Secondary | ICD-10-CM

## 2014-07-11 DIAGNOSIS — C8230 Follicular lymphoma grade IIIa, unspecified site: Secondary | ICD-10-CM

## 2014-07-11 DIAGNOSIS — Z5111 Encounter for antineoplastic chemotherapy: Secondary | ICD-10-CM

## 2014-07-11 DIAGNOSIS — G44021 Chronic cluster headache, intractable: Secondary | ICD-10-CM | POA: Insufficient documentation

## 2014-07-11 DIAGNOSIS — G971 Other reaction to spinal and lumbar puncture: Secondary | ICD-10-CM

## 2014-07-11 DIAGNOSIS — R519 Headache, unspecified: Secondary | ICD-10-CM

## 2014-07-11 DIAGNOSIS — M898X9 Other specified disorders of bone, unspecified site: Secondary | ICD-10-CM

## 2014-07-11 DIAGNOSIS — R591 Generalized enlarged lymph nodes: Secondary | ICD-10-CM

## 2014-07-11 DIAGNOSIS — K5900 Constipation, unspecified: Secondary | ICD-10-CM | POA: Insufficient documentation

## 2014-07-11 DIAGNOSIS — K5909 Other constipation: Secondary | ICD-10-CM | POA: Insufficient documentation

## 2014-07-11 DIAGNOSIS — Z5112 Encounter for antineoplastic immunotherapy: Secondary | ICD-10-CM

## 2014-07-11 HISTORY — DX: Other reaction to spinal and lumbar puncture: G97.1

## 2014-07-11 HISTORY — DX: Constipation, unspecified: K59.00

## 2014-07-11 HISTORY — DX: Headache, unspecified: R51.9

## 2014-07-11 LAB — CBC WITH DIFFERENTIAL/PLATELET
BASO%: 1.1 % (ref 0.0–2.0)
Basophils Absolute: 0.1 10*3/uL (ref 0.0–0.1)
EOS%: 0.8 % (ref 0.0–7.0)
Eosinophils Absolute: 0 10*3/uL (ref 0.0–0.5)
HCT: 41.8 % (ref 38.4–49.9)
HGB: 13.9 g/dL (ref 13.0–17.1)
LYMPH%: 14.6 % (ref 14.0–49.0)
MCH: 29.4 pg (ref 27.2–33.4)
MCHC: 33.4 g/dL (ref 32.0–36.0)
MCV: 88.2 fL (ref 79.3–98.0)
MONO#: 0.6 10*3/uL (ref 0.1–0.9)
MONO%: 10.5 % (ref 0.0–14.0)
NEUT#: 4.2 10*3/uL (ref 1.5–6.5)
NEUT%: 73 % (ref 39.0–75.0)
Platelets: 276 10*3/uL (ref 140–400)
RBC: 4.74 10*6/uL (ref 4.20–5.82)
RDW: 13.4 % (ref 11.0–14.6)
WBC: 5.7 10*3/uL (ref 4.0–10.3)
lymph#: 0.8 10*3/uL — ABNORMAL LOW (ref 0.9–3.3)

## 2014-07-11 LAB — COMPREHENSIVE METABOLIC PANEL (CC13)
ALT: 33 U/L (ref 0–55)
AST: 16 U/L (ref 5–34)
Albumin: 3.7 g/dL (ref 3.5–5.0)
Alkaline Phosphatase: 70 U/L (ref 40–150)
Anion Gap: 7 mEq/L (ref 3–11)
BUN: 8.7 mg/dL (ref 7.0–26.0)
CO2: 28 mEq/L (ref 22–29)
Calcium: 9.6 mg/dL (ref 8.4–10.4)
Chloride: 103 mEq/L (ref 98–109)
Creatinine: 0.8 mg/dL (ref 0.7–1.3)
Glucose: 99 mg/dl (ref 70–140)
Potassium: 4.2 mEq/L (ref 3.5–5.1)
Sodium: 138 mEq/L (ref 136–145)
Total Bilirubin: 0.36 mg/dL (ref 0.20–1.20)
Total Protein: 6.8 g/dL (ref 6.4–8.3)

## 2014-07-11 MED ORDER — HEPARIN SOD (PORK) LOCK FLUSH 100 UNIT/ML IV SOLN
500.0000 [IU] | Freq: Once | INTRAVENOUS | Status: AC | PRN
  Administered 2014-07-11: 500 [IU]
  Filled 2014-07-11: qty 5

## 2014-07-11 MED ORDER — VINCRISTINE SULFATE CHEMO INJECTION 1 MG/ML
2.0000 mg | Freq: Once | INTRAVENOUS | Status: AC
  Administered 2014-07-11: 2 mg via INTRAVENOUS
  Filled 2014-07-11: qty 2

## 2014-07-11 MED ORDER — SODIUM CHLORIDE 0.9 % IV SOLN
INTRAVENOUS | Status: AC
  Administered 2014-07-11: 13:00:00 via INTRAVENOUS

## 2014-07-11 MED ORDER — DIPHENHYDRAMINE HCL 25 MG PO CAPS
50.0000 mg | ORAL_CAPSULE | Freq: Once | ORAL | Status: AC
  Administered 2014-07-11: 50 mg via ORAL

## 2014-07-11 MED ORDER — SODIUM CHLORIDE 0.9 % IV SOLN
Freq: Once | INTRAVENOUS | Status: AC
  Administered 2014-07-11: 10:00:00 via INTRAVENOUS

## 2014-07-11 MED ORDER — DIPHENHYDRAMINE HCL 25 MG PO CAPS
ORAL_CAPSULE | ORAL | Status: AC
  Filled 2014-07-11: qty 2

## 2014-07-11 MED ORDER — SODIUM CHLORIDE 0.9 % IV SOLN
375.0000 mg/m2 | Freq: Once | INTRAVENOUS | Status: AC
  Administered 2014-07-11: 700 mg via INTRAVENOUS
  Filled 2014-07-11: qty 70

## 2014-07-11 MED ORDER — OXYCODONE HCL 10 MG PO TABS: 10.0000 mg | ORAL_TABLET | ORAL | Status: DC | PRN

## 2014-07-11 MED ORDER — SODIUM CHLORIDE 0.9 % IJ SOLN
Freq: Once | INTRAMUSCULAR | Status: AC
  Administered 2014-07-11: 15:00:00 via INTRATHECAL
  Filled 2014-07-11: qty 0.48

## 2014-07-11 MED ORDER — DEXAMETHASONE SODIUM PHOSPHATE 20 MG/5ML IJ SOLN
20.0000 mg | Freq: Once | INTRAMUSCULAR | Status: AC
  Administered 2014-07-11: 20 mg via INTRAVENOUS

## 2014-07-11 MED ORDER — ACETAMINOPHEN 325 MG PO TABS
ORAL_TABLET | ORAL | Status: AC
  Filled 2014-07-11: qty 2

## 2014-07-11 MED ORDER — SODIUM CHLORIDE 0.9 % IV SOLN
Freq: Once | INTRAVENOUS | Status: AC
  Administered 2014-07-11: 15:00:00 via INTRAVENOUS

## 2014-07-11 MED ORDER — ACETAMINOPHEN 325 MG PO TABS
650.0000 mg | ORAL_TABLET | Freq: Once | ORAL | Status: AC
  Administered 2014-07-11: 650 mg via ORAL

## 2014-07-11 MED ORDER — ONDANSETRON 16 MG/50ML IVPB (CHCC)
INTRAVENOUS | Status: AC
  Filled 2014-07-11: qty 16

## 2014-07-11 MED ORDER — SODIUM CHLORIDE 0.9 % IJ SOLN
10.0000 mL | INTRAMUSCULAR | Status: DC | PRN
  Administered 2014-07-11: 10 mL
  Filled 2014-07-11: qty 10

## 2014-07-11 MED ORDER — DOXORUBICIN HCL CHEMO IV INJECTION 2 MG/ML
50.0000 mg/m2 | Freq: Once | INTRAVENOUS | Status: AC
  Administered 2014-07-11: 98 mg via INTRAVENOUS
  Filled 2014-07-11: qty 49

## 2014-07-11 MED ORDER — SODIUM CHLORIDE 0.9 % IV SOLN
750.0000 mg/m2 | Freq: Once | INTRAVENOUS | Status: AC
  Administered 2014-07-11: 1460 mg via INTRAVENOUS
  Filled 2014-07-11: qty 73

## 2014-07-11 MED ORDER — POLYETHYLENE GLYCOL 3350 17 G PO PACK: 17.0000 g | PACK | Freq: Every day | ORAL | Status: DC

## 2014-07-11 MED ORDER — BUTALBITAL-APAP-CAFFEINE 50-325-40 MG PO TABS: 1.0000 | ORAL_TABLET | Freq: Four times a day (QID) | ORAL | Status: DC | PRN

## 2014-07-11 MED ORDER — ONDANSETRON 16 MG/50ML IVPB (CHCC)
16.0000 mg | Freq: Once | INTRAVENOUS | Status: AC
  Administered 2014-07-11: 16 mg via INTRAVENOUS

## 2014-07-11 MED ORDER — OXYCODONE-ACETAMINOPHEN 5-325 MG PO TABS
1.0000 | ORAL_TABLET | ORAL | Status: AC
  Administered 2014-07-11: 1 via ORAL

## 2014-07-11 MED ORDER — DEXAMETHASONE SODIUM PHOSPHATE 20 MG/5ML IJ SOLN
INTRAMUSCULAR | Status: AC
  Filled 2014-07-11: qty 5

## 2014-07-11 NOTE — Progress Notes (Signed)
Per Dr Alvy Bimler, ok to increase IVF rate to 969ml/hr.    Pain 4/10 in chest, per Dr Alvy Bimler, give 1 tablet percocet

## 2014-07-11 NOTE — Assessment & Plan Note (Signed)
This has regressed in size.

## 2014-07-11 NOTE — Patient Instructions (Signed)
[facility] Discharge Instructions for Patients Receiving Chemotherapy  Today you received the following chemotherapy agents Rituxan/Adriamycin/Vincristine/Cytoxan/Methotrexate To help prevent nausea and vomiting after your treatment, we encourage you to take your nausea medication as prescribed.   If you develop nausea and vomiting that is not controlled by your nausea medication, call the clinic.   BELOW ARE SYMPTOMS THAT SHOULD BE REPORTED IMMEDIATELY:  *FEVER GREATER THAN 100.5 F  *CHILLS WITH OR WITHOUT FEVER  NAUSEA AND VOMITING THAT IS NOT CONTROLLED WITH YOUR NAUSEA MEDICATION  *UNUSUAL SHORTNESS OF BREATH  *UNUSUAL BRUISING OR BLEEDING  TENDERNESS IN MOUTH AND THROAT WITH OR WITHOUT PRESENCE OF ULCERS  *URINARY PROBLEMS  *BOWEL PROBLEMS  UNUSUAL RASH Items with * indicate a potential emergency and should be followed up as soon as possible.  Feel free to call the clinic you have any questions or concerns. The clinic phone number is [phone].

## 2014-07-11 NOTE — Progress Notes (Signed)
[facility] OFFICE PROGRESS NOTE  Patient Care Team: Christain Sacramento, MD as PCP - General (Family Medicine) Jackolyn Confer, MD as Referring Physician (General Surgery) Heath Lark, MD as Consulting Physician (Hematology and Oncology)  SUMMARY OF ONCOLOGIC HISTORY: Oncology History   Follicular lymphoma grade 3a   Primary site: Lymphoid Neoplasms   Staging method: AJCC 6th Edition   Clinical free text: Bone marrow is involved on PET; Possible component of DLBCL   Clinical: Stage IV signed by Heath Lark, MD on 06/18/2014  8:05 PM   Summary: Stage IV       Follicular lymphoma grade 3a   05/13/2014 Imaging CT scan of chest and neck showed extensive lymphadenopathy in the neck and medistinum   05/19/2014 Procedure [id number] US biopsy only showed necrotic tissue   06/03/2014 Surgery Excisional LN biopsy of right supraclavicular region showed follicular lymphoma grade 3 [id number]   06/14/2014 Imaging PET CT scan showed diffuse, bulky lymphadenopathy, splenic involvement and bone marrow disease   06/16/2014 Imaging ECHO showed preserved EF 50%   06/17/2014 Procedure He has port placement   06/20/2014 -  Chemotherapy He received cycle 1 of chemotherapy with R. CHOP along with intrathecal methotrexate    INTERVAL HISTORY: Please see below for problem oriented charting. He is a prior to cycle 2 of treatment. With cycle 1 of treatment, he had severe post spinal tap headache. The lymph node in his neck is smaller.  REVIEW OF SYSTEMS:   Constitutional: Denies fevers, chills or abnormal weight loss Eyes: Denies blurriness of vision Ears, nose, mouth, throat, and face: Denies mucositis or sore throat Respiratory: Denies cough, dyspnea or wheezes Cardiovascular: Denies palpitation, chest discomfort or lower extremity swelling Gastrointestinal:  Denies nausea, heartburn or change in bowel habits Skin: Denies abnormal skin rashes Lymphatics: Denies new lymphadenopathy or easy  bruising Neurological:Denies numbness, tingling or new weaknesses Behavioral/Psych: Mood is stable, no new changes  All other systems were reviewed with the patient and are negative.  I have reviewed the past medical history, past surgical history, social history and family history with the patient and they are unchanged from previous note.  ALLERGIES:  has No Known Allergies.  MEDICATIONS:  Current Outpatient Prescriptions  Medication Sig Dispense Refill  . allopurinol (ZYLOPRIM) 300 MG tablet Take 1 tablet (300 mg total) by mouth daily.  30 tablet  0  . cholecalciferol (VITAMIN D) 1000 UNITS tablet Take 1,000 Units by mouth daily.      Marland Kitchen HYDROcodone-acetaminophen (NORCO/VICODIN) 5-325 MG per tablet Take 1-2 tablets by mouth every 4 (four) hours as needed for moderate pain or severe pain.  30 tablet  0  . ibuprofen (ADVIL,MOTRIN) 200 MG tablet Take 400 mg by mouth every 6 (six) hours as needed for headache or moderate pain.      Marland Kitchen lidocaine-prilocaine (EMLA) cream Apply 1 application topically as needed.  30 g  6  . Melatonin 5 MG CAPS Take 10 mg by mouth at bedtime as needed (for sleep).       . ondansetron (ZOFRAN) 8 MG tablet Take 1 tablet (8 mg total) by mouth every 8 (eight) hours as needed for nausea.  30 tablet  1  . oxyCODONE 10 MG TABS Take 1 tablet (10 mg total) by mouth every 4 (four) hours as needed for severe pain.  30 tablet  0  . predniSONE (DELTASONE) 20 MG tablet Take 3 tablets (60 mg total) by mouth daily. Take on days 1-5 of chemotherapy.  [place]  tablet  6  . Probiotic Product (PROBIOTIC DAILY PO) Take 1 capsule by mouth daily.      . prochlorperazine (COMPAZINE) 10 MG tablet Take 1 tablet (10 mg total) by mouth every 6 (six) hours as needed (Nausea or vomiting).  30 tablet  6  . Turmeric 500 MG CAPS Take 500 mg by mouth daily.      Marland Kitchen zolpidem (AMBIEN) 10 MG tablet Take 1 tablet (10 mg total) by mouth at bedtime as needed for sleep.  30 tablet  0  .  butalbital-acetaminophen-caffeine (FIORICET) 50-325-40 MG per tablet Take 1 tablet by mouth every 6 (six) hours as needed for headache.  60 tablet  0  . polyethylene glycol (MIRALAX) packet Take 17 g by mouth daily.  14 each  0   No current facility-administered medications for this visit.   Facility-Administered Medications Ordered in Other Visits  Medication Dose Route Frequency Provider Last Rate Last Dose  . 0.9 %  sodium chloride infusion   Intravenous Once Heath Lark, MD 500 mL/hr at 07/11/14 1545    . heparin lock flush 100 unit/mL  500 Units Intracatheter Once PRN Heath Lark, MD      . sodium chloride 0.9 % injection 10 mL  10 mL Intracatheter PRN Heath Lark, MD        PHYSICAL EXAMINATION: ECOG PERFORMANCE STATUS: 1 - Symptomatic but completely ambulatory  Filed Vitals:   07/11/14 0821  BP: 155/87  Pulse: 104  Temp: 98.2 F (36.8 C)  Resp: 19   Filed Weights   07/11/14 0821  Weight: 164 lb 11.2 oz (74.707 kg)    GENERAL:alert, no distress and comfortable SKIN: skin color, texture, turgor are normal, no rashes or significant lesions EYES: normal, Conjunctiva are pink and non-injected, sclera clear OROPHARYNX:no exudate, no erythema and lips, buccal mucosa, and tongue normal  NECK: supple, thyroid normal size, non-tender, without nodularity LYMPH:  The lymph nodes in his neck are smaller. LUNGS: clear to auscultation and percussion with normal breathing effort HEART: regular rate & rhythm and no murmurs and no lower extremity edema ABDOMEN:abdomen soft, non-tender and normal bowel sounds Musculoskeletal:no cyanosis of digits and no clubbing  NEURO: alert & oriented x 3 with fluent speech, no focal motor/sensory deficits  LABORATORY DATA:  I have reviewed the data as listed    Component Value Date/Time   NA 138 07/11/2014 0806   NA 138 06/02/2014 1350   K 4.2 07/11/2014 0806   K 4.4 06/02/2014 1350   CL 98 06/02/2014 1350   CO2 28 07/11/2014 0806   CO2 28 06/02/2014  1350   GLUCOSE 99 07/11/2014 0806   GLUCOSE 85 06/02/2014 1350   BUN 8.7 07/11/2014 0806   BUN 12 06/02/2014 1350   CREATININE 0.8 07/11/2014 0806   CREATININE 0.97 06/02/2014 1350   CALCIUM 9.6 07/11/2014 0806   CALCIUM 9.5 06/02/2014 1350   PROT 6.8 07/11/2014 0806   PROT 7.3 06/02/2014 1350   ALBUMIN 3.7 07/11/2014 0806   ALBUMIN 3.9 06/02/2014 1350   AST 16 07/11/2014 0806   AST 23 06/02/2014 1350   ALT 33 07/11/2014 0806   ALT 27 06/02/2014 1350   ALKPHOS 70 07/11/2014 0806   ALKPHOS 75 06/02/2014 1350   BILITOT 0.36 07/11/2014 0806   BILITOT 0.3 06/02/2014 1350   GFRNONAA 90* 06/02/2014 1350   GFRAA >90 06/02/2014 1350    No results found for this basename: SPEP, UPEP,  kappa and lambda light chains    Lab  Results  Component Value Date   WBC 5.7 07/11/2014   NEUTROABS 4.2 07/11/2014   HGB 13.9 07/11/2014   HCT 41.8 07/11/2014   MCV 88.2 07/11/2014   PLT 276 07/11/2014      Chemistry      Component Value Date/Time   NA 138 07/11/2014 0806   NA 138 06/02/2014 1350   K 4.2 07/11/2014 0806   K 4.4 06/02/2014 1350   CL 98 06/02/2014 1350   CO2 28 07/11/2014 0806   CO2 28 06/02/2014 1350   BUN 8.7 07/11/2014 0806   BUN 12 06/02/2014 1350   CREATININE 0.8 07/11/2014 0806   CREATININE 0.97 06/02/2014 1350      Component Value Date/Time   CALCIUM 9.6 07/11/2014 0806   CALCIUM 9.5 06/02/2014 1350   ALKPHOS 70 07/11/2014 0806   ALKPHOS 75 06/02/2014 1350   AST 16 07/11/2014 0806   AST 23 06/02/2014 1350   ALT 33 07/11/2014 0806   ALT 27 06/02/2014 1350   BILITOT 0.36 07/11/2014 0806   BILITOT 0.3 06/02/2014 1350      ASSESSMENT & PLAN:  Follicular lymphoma grade 3a Overall, he tolerated chemotherapy well apart from spinal headache related to intrathecal chemotherapy. CSF fluid were negative for cancer. I would not draw extra fluid for diagnostic but will continue methotrexate and try to give him extra fluids afterwards to prevent a spinal headache. I also gave him prescription pain medicine to take  as needed.  Lymphadenopathy This has regressed in size.  Spinal headache This was related to recent intrathecal chemotherapy. I will proceed to give him prescription for a medicine as needed and will give him extra IV fluids afterwards to prevent another headache.  Diagnostic Lumbar Puncture and Intrathecal administration of chemotherapy Procedure Note   Informed consent was obtained and potential risks including bleeding, infection and pain were reviewed with the patient.  The patient's name, date of birth, identification, consent and allergies were verified prior to the start of procedure and time out was performed.  The skin was prepped with Betadine solution.   3 cc of 1% lidocaine was used to provide local anaesthesia.   The L3/L4 intrathecal space was chosen as the site of procedure.  5 ml/12 mg methotrexate was administered into the intrathecal space.  The procedure was tolerated well and there were no complications.  The patient was stable at the end of the procedure.  All questions were answered. The patient knows to call the clinic with any problems, questions or concerns. No barriers to learning was detected. I spent 40 minutes counseling the patient face to face. The total time spent in the appointment was 60 minutes and more than 50% was on counseling and review of test results     [facility], [place], MD 07/11/2014 2:50 PM

## 2014-07-11 NOTE — Progress Notes (Signed)
Positive blood return noted before,every 5 mL during, and after Adriamycin administration. Patient tolerated well.  Patient remained flat with normal saline IV 510ml/hr for one hour post IT procedure. Patient denies headache and states he feels fine. Patient discharged in no acute distress.

## 2014-07-11 NOTE — Assessment & Plan Note (Signed)
Overall, he tolerated chemotherapy well apart from spinal headache related to intrathecal chemotherapy. CSF fluid were negative for cancer. I would not draw extra fluid for diagnostic but will continue methotrexate and try to give him extra fluids afterwards to prevent a spinal headache. I also gave him prescription pain medicine to take as needed.

## 2014-07-11 NOTE — Assessment & Plan Note (Signed)
This was related to recent intrathecal chemotherapy. I will proceed to give him prescription for a medicine as needed and will give him extra IV fluids afterwards to prevent another headache.

## 2014-07-12 ENCOUNTER — Ambulatory Visit (HOSPITAL_BASED_OUTPATIENT_CLINIC_OR_DEPARTMENT_OTHER): Payer: BC Managed Care – PPO

## 2014-07-12 VITALS — BP 157/81 | HR 92 | Temp 98.3°F | Resp 18

## 2014-07-12 DIAGNOSIS — Z5189 Encounter for other specified aftercare: Secondary | ICD-10-CM

## 2014-07-12 DIAGNOSIS — C8228 Follicular lymphoma grade III, unspecified, lymph nodes of multiple sites: Secondary | ICD-10-CM

## 2014-07-12 DIAGNOSIS — C8230 Follicular lymphoma grade IIIa, unspecified site: Secondary | ICD-10-CM

## 2014-07-12 MED ORDER — PEGFILGRASTIM INJECTION 6 MG/0.6ML
6.0000 mg | Freq: Once | SUBCUTANEOUS | Status: AC
  Administered 2014-07-12: 6 mg via SUBCUTANEOUS

## 2014-07-12 NOTE — Patient Instructions (Signed)
Pegfilgrastim injection What is this medicine? PEGFILGRASTIM (peg fil GRA stim) is a long-acting granulocyte colony-stimulating factor that stimulates the growth of neutrophils, a type of white blood cell important in the body's fight against infection. It is used to reduce the incidence of fever and infection in patients with certain types of cancer who are receiving chemotherapy that affects the bone marrow. This medicine may be used for other purposes; ask your health care provider or pharmacist if you have questions. COMMON BRAND NAME(S): Neulasta What should I tell my health care provider before I take this medicine? They need to know if you have any of these conditions: -latex allergy -ongoing radiation therapy -sickle cell disease -skin reactions to acrylic adhesives (On-Body Injector only) -an unusual or allergic reaction to pegfilgrastim, filgrastim, other medicines, foods, dyes, or preservatives -pregnant or trying to get pregnant -breast-feeding How should I use this medicine? This medicine is for injection under the skin. If you get this medicine at home, you will be taught how to prepare and give the pre-filled syringe or how to use the On-body Injector. Refer to the patient Instructions for Use for detailed instructions. Use exactly as directed. Take your medicine at regular intervals. Do not take your medicine more often than directed. It is important that you put your used needles and syringes in a special sharps container. Do not put them in a trash can. If you do not have a sharps container, call your pharmacist or healthcare provider to get one. Talk to your pediatrician regarding the use of this medicine in children. Special care may be needed. Overdosage: If you think you have taken too much of this medicine contact a poison control center or emergency room at once. NOTE: This medicine is only for you. Do not share this medicine with others. What if I miss a dose? It is  important not to miss your dose. Call your doctor or health care professional if you miss your dose. If you miss a dose due to an On-body Injector failure or leakage, a new dose should be administered as soon as possible using a single prefilled syringe for manual use. What may interact with this medicine? Interactions have not been studied. Give your health care provider a list of all the medicines, herbs, non-prescription drugs, or dietary supplements you use. Also tell them if you smoke, drink alcohol, or use illegal drugs. Some items may interact with your medicine. This list may not describe all possible interactions. Give your health care provider a list of all the medicines, herbs, non-prescription drugs, or dietary supplements you use. Also tell them if you smoke, drink alcohol, or use illegal drugs. Some items may interact with your medicine. What should I watch for while using this medicine? You may need blood work done while you are taking this medicine. If you are going to need a MRI, CT scan, or other procedure, tell your doctor that you are using this medicine (On-Body Injector only). What side effects may I notice from receiving this medicine? Side effects that you should report to your doctor or health care professional as soon as possible: -allergic reactions like skin rash, itching or hives, swelling of the face, lips, or tongue -dizziness -fever -pain, redness, or irritation at site where injected -pinpoint red spots on the skin -shortness of breath or breathing problems -stomach or side pain, or pain at the shoulder -swelling -tiredness -trouble passing urine Side effects that usually do not require medical attention (report to your doctor   or health care professional if they continue or are bothersome): -bone pain -muscle pain This list may not describe all possible side effects. Call your doctor for medical advice about side effects. You may report side effects to FDA at  1-800-FDA-1088. Where should I keep my medicine? Keep out of the reach of children. Store pre-filled syringes in a refrigerator between 2 and 8 degrees C (36 and 46 degrees F). Do not freeze. Keep in carton to protect from light. Throw away this medicine if it is left out of the refrigerator for more than 48 hours. Throw away any unused medicine after the expiration date. NOTE: This sheet is a summary. It may not cover all possible information. If you have questions about this medicine, talk to your doctor, pharmacist, or health care provider.  2015, [employer]. (2013-12-19 16:14:05)  

## 2014-07-14 ENCOUNTER — Telehealth: Payer: Self-pay | Admitting: Hematology and Oncology

## 2014-07-14 ENCOUNTER — Telehealth: Payer: Self-pay | Admitting: *Deleted

## 2014-07-14 NOTE — Telephone Encounter (Signed)
, °

## 2014-07-14 NOTE — Telephone Encounter (Signed)
Pt states he has been feeling a little better. Thinks he might have eaten too much the day after chemo. Has been taking nausea meds. Does not feel need for IVF Will call if has problems

## 2014-08-01 ENCOUNTER — Telehealth: Payer: Self-pay | Admitting: Hematology and Oncology

## 2014-08-01 ENCOUNTER — Telehealth: Payer: Self-pay | Admitting: *Deleted

## 2014-08-01 ENCOUNTER — Ambulatory Visit (HOSPITAL_BASED_OUTPATIENT_CLINIC_OR_DEPARTMENT_OTHER): Payer: BC Managed Care – PPO | Admitting: Hematology and Oncology

## 2014-08-01 ENCOUNTER — Other Ambulatory Visit (HOSPITAL_BASED_OUTPATIENT_CLINIC_OR_DEPARTMENT_OTHER): Payer: BC Managed Care – PPO

## 2014-08-01 ENCOUNTER — Encounter: Payer: Self-pay | Admitting: Hematology and Oncology

## 2014-08-01 ENCOUNTER — Ambulatory Visit (HOSPITAL_BASED_OUTPATIENT_CLINIC_OR_DEPARTMENT_OTHER): Payer: BC Managed Care – PPO

## 2014-08-01 VITALS — BP 137/76 | HR 97 | Temp 98.2°F | Resp 18 | Ht 70.0 in | Wt 168.3 lb

## 2014-08-01 VITALS — BP 158/77 | HR 110 | Temp 98.1°F | Resp 20

## 2014-08-01 DIAGNOSIS — G4700 Insomnia, unspecified: Secondary | ICD-10-CM

## 2014-08-01 DIAGNOSIS — C8230 Follicular lymphoma grade IIIa, unspecified site: Secondary | ICD-10-CM

## 2014-08-01 DIAGNOSIS — G971 Other reaction to spinal and lumbar puncture: Secondary | ICD-10-CM

## 2014-08-01 DIAGNOSIS — Z5111 Encounter for antineoplastic chemotherapy: Secondary | ICD-10-CM

## 2014-08-01 DIAGNOSIS — R51 Headache: Secondary | ICD-10-CM

## 2014-08-01 DIAGNOSIS — Z5112 Encounter for antineoplastic immunotherapy: Secondary | ICD-10-CM

## 2014-08-01 LAB — CBC WITH DIFFERENTIAL/PLATELET
BASO%: 0.3 % (ref 0.0–2.0)
Basophils Absolute: 0 10*3/uL (ref 0.0–0.1)
EOS%: 0.1 % (ref 0.0–7.0)
Eosinophils Absolute: 0 10*3/uL (ref 0.0–0.5)
HCT: 39.4 % (ref 38.4–49.9)
HGB: 13.1 g/dL (ref 13.0–17.1)
LYMPH%: 3.6 % — ABNORMAL LOW (ref 14.0–49.0)
MCH: 29.2 pg (ref 27.2–33.4)
MCHC: 33.2 g/dL (ref 32.0–36.0)
MCV: 88 fL (ref 79.3–98.0)
MONO#: 0.2 10*3/uL (ref 0.1–0.9)
MONO%: 2 % (ref 0.0–14.0)
NEUT#: 9.2 10*3/uL — ABNORMAL HIGH (ref 1.5–6.5)
NEUT%: 94 % — ABNORMAL HIGH (ref 39.0–75.0)
Platelets: 333 10*3/uL (ref 140–400)
RBC: 4.47 10*6/uL (ref 4.20–5.82)
RDW: 15.1 % — ABNORMAL HIGH (ref 11.0–14.6)
WBC: 9.8 10*3/uL (ref 4.0–10.3)
lymph#: 0.4 10*3/uL — ABNORMAL LOW (ref 0.9–3.3)

## 2014-08-01 LAB — COMPREHENSIVE METABOLIC PANEL (CC13)
ALT: 25 U/L (ref 0–55)
AST: 14 U/L (ref 5–34)
Albumin: 3.8 g/dL (ref 3.5–5.0)
Alkaline Phosphatase: 75 U/L (ref 40–150)
Anion Gap: 8 mEq/L (ref 3–11)
BUN: 14.5 mg/dL (ref 7.0–26.0)
CO2: 25 mEq/L (ref 22–29)
Calcium: 9.3 mg/dL (ref 8.4–10.4)
Chloride: 104 mEq/L (ref 98–109)
Creatinine: 0.8 mg/dL (ref 0.7–1.3)
Glucose: 103 mg/dl (ref 70–140)
Potassium: 4.3 mEq/L (ref 3.5–5.1)
Sodium: 138 mEq/L (ref 136–145)
Total Bilirubin: 0.27 mg/dL (ref 0.20–1.20)
Total Protein: 6.8 g/dL (ref 6.4–8.3)

## 2014-08-01 MED ORDER — ACETAMINOPHEN 325 MG PO TABS
650.0000 mg | ORAL_TABLET | Freq: Once | ORAL | Status: AC
  Administered 2014-08-01: 650 mg via ORAL

## 2014-08-01 MED ORDER — ONDANSETRON 16 MG/50ML IVPB (CHCC)
INTRAVENOUS | Status: AC
  Filled 2014-08-01: qty 16

## 2014-08-01 MED ORDER — DIPHENHYDRAMINE HCL 25 MG PO CAPS
50.0000 mg | ORAL_CAPSULE | Freq: Once | ORAL | Status: AC
  Administered 2014-08-01: 50 mg via ORAL

## 2014-08-01 MED ORDER — OXYCODONE HCL 15 MG PO TABS: 15.0000 mg | ORAL_TABLET | ORAL | Status: DC | PRN

## 2014-08-01 MED ORDER — PROCHLORPERAZINE MALEATE 10 MG PO TABS
ORAL_TABLET | ORAL | Status: AC
  Filled 2014-08-01: qty 1

## 2014-08-01 MED ORDER — SODIUM CHLORIDE 0.9 % IJ SOLN
10.0000 mL | INTRAMUSCULAR | Status: DC | PRN
  Administered 2014-08-01: 10 mL
  Filled 2014-08-01: qty 10

## 2014-08-01 MED ORDER — SODIUM CHLORIDE 0.9 % IV SOLN
750.0000 mg/m2 | Freq: Once | INTRAVENOUS | Status: AC
  Administered 2014-08-01: 1460 mg via INTRAVENOUS
  Filled 2014-08-01: qty 73

## 2014-08-01 MED ORDER — SODIUM CHLORIDE 0.9 % IV SOLN
Freq: Once | INTRAVENOUS | Status: AC
  Administered 2014-08-01: 11:00:00 via INTRAVENOUS

## 2014-08-01 MED ORDER — ACETAMINOPHEN 325 MG PO TABS
ORAL_TABLET | ORAL | Status: AC
  Filled 2014-08-01: qty 2

## 2014-08-01 MED ORDER — DOXORUBICIN HCL CHEMO IV INJECTION 2 MG/ML
50.0000 mg/m2 | Freq: Once | INTRAVENOUS | Status: AC
  Administered 2014-08-01: 98 mg via INTRAVENOUS
  Filled 2014-08-01: qty 49

## 2014-08-01 MED ORDER — VINCRISTINE SULFATE CHEMO INJECTION 1 MG/ML
2.0000 mg | Freq: Once | INTRAVENOUS | Status: AC
  Administered 2014-08-01: 2 mg via INTRAVENOUS
  Filled 2014-08-01: qty 2

## 2014-08-01 MED ORDER — TEMAZEPAM 15 MG PO CAPS: 15.0000 mg | ORAL_CAPSULE | Freq: Every evening | ORAL | Status: DC | PRN

## 2014-08-01 MED ORDER — ONDANSETRON 16 MG/50ML IVPB (CHCC)
16.0000 mg | Freq: Once | INTRAVENOUS | Status: AC
  Administered 2014-08-01: 16 mg via INTRAVENOUS

## 2014-08-01 MED ORDER — DEXAMETHASONE SODIUM PHOSPHATE 20 MG/5ML IJ SOLN
INTRAMUSCULAR | Status: AC
  Filled 2014-08-01: qty 5

## 2014-08-01 MED ORDER — DEXAMETHASONE SODIUM PHOSPHATE 20 MG/5ML IJ SOLN
20.0000 mg | Freq: Once | INTRAMUSCULAR | Status: AC
  Administered 2014-08-01: 20 mg via INTRAVENOUS

## 2014-08-01 MED ORDER — SODIUM CHLORIDE 0.9 % IV SOLN
375.0000 mg/m2 | Freq: Once | INTRAVENOUS | Status: AC
  Administered 2014-08-01: 700 mg via INTRAVENOUS
  Filled 2014-08-01: qty 70

## 2014-08-01 MED ORDER — HEPARIN SOD (PORK) LOCK FLUSH 100 UNIT/ML IV SOLN
500.0000 [IU] | Freq: Once | INTRAVENOUS | Status: AC | PRN
  Administered 2014-08-01: 500 [IU]
  Filled 2014-08-01: qty 5

## 2014-08-01 MED ORDER — DIPHENHYDRAMINE HCL 25 MG PO CAPS
ORAL_CAPSULE | ORAL | Status: AC
  Filled 2014-08-01: qty 2

## 2014-08-01 NOTE — Telephone Encounter (Signed)
Per staff message and POF I have scheduled appts. Advised scheduler of appts. JMW  

## 2014-08-01 NOTE — Assessment & Plan Note (Signed)
Overall, he tolerated chemotherapy well apart from spinal headache related to intrathecal chemotherapy. CSF fluid were negative for cancer. I will proceed with cycle 3 of chemotherapy without dose adjustment. We will omit further doses of intrathecal treatment. I plan to order a PET CT scan before he returns for cycle 4 treatment. Clinically, he has excellent response to treatment with regression of the size of his lymph node.

## 2014-08-01 NOTE — Progress Notes (Signed)
[facility] OFFICE PROGRESS NOTE  Patient Care Team: Christain Sacramento, MD as PCP - General (Family Medicine) Jackolyn Confer, MD as Referring Physician (General Surgery) Heath Lark, MD as Consulting Physician (Hematology and Oncology)  SUMMARY OF ONCOLOGIC HISTORY: Oncology History   Follicular lymphoma grade 3a   Primary site: Lymphoid Neoplasms   Staging method: AJCC 6th Edition   Clinical free text: Bone marrow is involved on PET; Possible component of DLBCL   Clinical: Stage IV signed by Heath Lark, MD on 06/18/2014  8:05 PM   Summary: Stage IV       Follicular lymphoma grade 3a   05/13/2014 Imaging CT scan of chest and neck showed extensive lymphadenopathy in the neck and medistinum   05/19/2014 Procedure [id number] US biopsy only showed necrotic tissue   06/03/2014 Surgery Excisional LN biopsy of right supraclavicular region showed follicular lymphoma grade 3 [id number]   06/14/2014 Imaging PET CT scan showed diffuse, bulky lymphadenopathy, splenic involvement and bone marrow disease   06/16/2014 Imaging ECHO showed preserved EF 50%   06/17/2014 Procedure He has port placement   06/20/2014 -  Chemotherapy He received cycle 1 of chemotherapy with R. CHOP along with intrathecal methotrexate. He received 2 doses of IT MTX    INTERVAL HISTORY: Please see below for problem oriented charting. He is seen prior to cycle 3 of therapy. He has persistent post lumbar puncture headache, resolved with pain medicine. He had occasional nausea. He complained of insomnia.  REVIEW OF SYSTEMS:   Constitutional: Denies fevers, chills or abnormal weight loss Eyes: Denies blurriness of vision Ears, nose, mouth, throat, and face: Denies mucositis or sore throat Respiratory: Denies cough, dyspnea or wheezes Cardiovascular: Denies palpitation, chest discomfort or lower extremity swelling Gastrointestinal:  Denies nausea, heartburn or change in bowel habits Skin: Denies abnormal skin  rashes Lymphatics: Denies new lymphadenopathy or easy bruising Neurological:Denies numbness, tingling or new weaknesses Behavioral/Psych: Mood is stable, no new changes  All other systems were reviewed with the patient and are negative.  I have reviewed the past medical history, past surgical history, social history and family history with the patient and they are unchanged from previous note.  ALLERGIES:  has No Known Allergies.  MEDICATIONS:  Current Outpatient Prescriptions  Medication Sig Dispense Refill  . butalbital-acetaminophen-caffeine (FIORICET) 50-325-40 MG per tablet Take 1 tablet by mouth every 6 (six) hours as needed for headache.  60 tablet  0  . cholecalciferol (VITAMIN D) 1000 UNITS tablet Take 1,000 Units by mouth daily.      Marland Kitchen lidocaine-prilocaine (EMLA) cream Apply 1 application topically as needed.  30 g  6  . Melatonin 5 MG CAPS Take 10 mg by mouth at bedtime as needed (for sleep).       . ondansetron (ZOFRAN) 8 MG tablet Take 1 tablet (8 mg total) by mouth every 8 (eight) hours as needed for nausea.  30 tablet  1  . oxyCODONE 10 MG TABS Take 1 tablet (10 mg total) by mouth every 4 (four) hours as needed for severe pain.  30 tablet  0  . polyethylene glycol (MIRALAX / GLYCOLAX) packet Take 17 g by mouth daily as needed.      . predniSONE (DELTASONE) 20 MG tablet Take 3 tablets (60 mg total) by mouth daily. Take on days 1-5 of chemotherapy.  60 tablet  6  . Probiotic Product (PROBIOTIC DAILY PO) Take 1 capsule by mouth daily.      . prochlorperazine (COMPAZINE) 10 MG  tablet Take 1 tablet (10 mg total) by mouth every 6 (six) hours as needed (Nausea or vomiting).  30 tablet  6  . Turmeric 500 MG CAPS Take 500 mg by mouth daily.      Marland Kitchen oxyCODONE (ROXICODONE) 15 MG immediate release tablet Take 1 tablet (15 mg total) by mouth every 4 (four) hours as needed for severe pain.  40 tablet  0  . temazepam (RESTORIL) 15 MG capsule Take 1 capsule (15 mg total) by mouth at bedtime as  needed for sleep.  30 capsule  0   No current facility-administered medications for this visit.    PHYSICAL EXAMINATION: ECOG PERFORMANCE STATUS: 1 - Symptomatic but completely ambulatory  Filed Vitals:   08/01/14 0848  BP: 137/76  Pulse: 97  Temp: 98.2 F (36.8 C)  Resp: 18   Filed Weights   08/01/14 0848  Weight: 168 lb 4.8 oz (76.34 kg)    GENERAL:alert, no distress and comfortable SKIN: skin color, texture, turgor are normal, no rashes or significant lesions EYES: normal, Conjunctiva are pink and non-injected, sclera clear OROPHARYNX:no exudate, no erythema and lips, buccal mucosa, and tongue normal  NECK: supple, thyroid normal size, non-tender, without nodularity LYMPH:  Previously palpable lymphadenopathy has regressed in size. LUNGS: clear to auscultation and percussion with normal breathing effort HEART: regular rate & rhythm and no murmurs and no lower extremity edema ABDOMEN:abdomen soft, non-tender and normal bowel sounds Musculoskeletal:no cyanosis of digits and no clubbing  NEURO: alert & oriented x 3 with fluent speech, no focal motor/sensory deficits  LABORATORY DATA:  I have reviewed the data as listed    Component Value Date/Time   NA 138 08/01/2014 0838   NA 138 06/02/2014 1350   K 4.3 08/01/2014 0838   K 4.4 06/02/2014 1350   CL 98 06/02/2014 1350   CO2 25 08/01/2014 0838   CO2 28 06/02/2014 1350   GLUCOSE 103 08/01/2014 0838   GLUCOSE 85 06/02/2014 1350   BUN 14.5 08/01/2014 0838   BUN 12 06/02/2014 1350   CREATININE 0.8 08/01/2014 0838   CREATININE 0.97 06/02/2014 1350   CALCIUM 9.3 08/01/2014 0838   CALCIUM 9.5 06/02/2014 1350   PROT 6.8 08/01/2014 0838   PROT 7.3 06/02/2014 1350   ALBUMIN 3.8 08/01/2014 0838   ALBUMIN 3.9 06/02/2014 1350   AST 14 08/01/2014 0838   AST 23 06/02/2014 1350   ALT 25 08/01/2014 0838   ALT 27 06/02/2014 1350   ALKPHOS 75 08/01/2014 0838   ALKPHOS 75 06/02/2014 1350   BILITOT 0.27 08/01/2014 0838   BILITOT 0.3  06/02/2014 1350   GFRNONAA 90* 06/02/2014 1350   GFRAA >90 06/02/2014 1350    No results found for this basename: SPEP, UPEP,  kappa and lambda light chains    Lab Results  Component Value Date   WBC 9.8 08/01/2014   NEUTROABS 9.2* 08/01/2014   HGB 13.1 08/01/2014   HCT 39.4 08/01/2014   MCV 88.0 08/01/2014   PLT 333 08/01/2014      Chemistry      Component Value Date/Time   NA 138 08/01/2014 0838   NA 138 06/02/2014 1350   K 4.3 08/01/2014 0838   K 4.4 06/02/2014 1350   CL 98 06/02/2014 1350   CO2 25 08/01/2014 0838   CO2 28 06/02/2014 1350   BUN 14.5 08/01/2014 0838   BUN 12 06/02/2014 1350   CREATININE 0.8 08/01/2014 0838   CREATININE 0.97 06/02/2014 1350  Component Value Date/Time   CALCIUM 9.3 08/01/2014 0838   CALCIUM 9.5 06/02/2014 1350   ALKPHOS 75 08/01/2014 0838   ALKPHOS 75 06/02/2014 1350   AST 14 08/01/2014 0838   AST 23 06/02/2014 1350   ALT 25 08/01/2014 0838   ALT 27 06/02/2014 1350   BILITOT 0.27 08/01/2014 0838   BILITOT 0.3 06/02/2014 1350      ASSESSMENT & PLAN:  Follicular lymphoma grade 3a Overall, he tolerated chemotherapy well apart from spinal headache related to intrathecal chemotherapy. CSF fluid were negative for cancer. I will proceed with cycle 3 of chemotherapy without dose adjustment. We will omit further doses of intrathecal treatment. I plan to order a PET CT scan before he returns for cycle 4 treatment. Clinically, he has excellent response to treatment with regression of the size of his lymph node.   Spinal headache This was related to recent intrathecal chemotherapy. I will proceed to give him prescription for pain. We will omit future doses of intrathecal treatment.    Insomnia He found Ambien unhelpful. I will give him a trial of temazepam.   Orders Placed This Encounter  Procedures  . NM PET Image Restag (PS) Skull Base To Thigh    Standing Status: Future     Number of Occurrences:      Standing Expiration Date:  10/01/2015    Order Specific Question:  Reason for Exam (SYMPTOM  OR DIAGNOSIS REQUIRED)    Answer:  staging lymphoma assess response to Rx    Order Specific Question:  Preferred imaging location?    Answer:  [facility]   All questions were answered. The patient knows to call the clinic with any problems, questions or concerns. No barriers to learning was detected. I spent 30 minutes counseling the patient face to face. The total time spent in the appointment was 40 minutes and more than 50% was on counseling and review of test results     [facility], [place], MD 08/01/2014 10:14 AM

## 2014-08-01 NOTE — Patient Instructions (Signed)
Cancer Center Discharge Instructions for Patients Receiving Chemotherapy  Today you received the following chemotherapy agents:  Adriamycin, Vincristine, Cytoxan and Rituxan  To help prevent nausea and vomiting after your treatment, we encourage you to take your nausea medication as ordered per MD.   If you develop nausea and vomiting that is not controlled by your nausea medication, call the clinic.   BELOW ARE SYMPTOMS THAT SHOULD BE REPORTED IMMEDIATELY:  *FEVER GREATER THAN 100.5 F  *CHILLS WITH OR WITHOUT FEVER  NAUSEA AND VOMITING THAT IS NOT CONTROLLED WITH YOUR NAUSEA MEDICATION  *UNUSUAL SHORTNESS OF BREATH  *UNUSUAL BRUISING OR BLEEDING  TENDERNESS IN MOUTH AND THROAT WITH OR WITHOUT PRESENCE OF ULCERS  *URINARY PROBLEMS  *BOWEL PROBLEMS  UNUSUAL RASH Items with * indicate a potential emergency and should be followed up as soon as possible.  Feel free to call the clinic you have any questions or concerns. The clinic phone number is [phone].    

## 2014-08-01 NOTE — Assessment & Plan Note (Signed)
This was related to recent intrathecal chemotherapy. I will proceed to give him prescription for pain. We will omit future doses of intrathecal treatment.

## 2014-08-01 NOTE — Telephone Encounter (Signed)
Gave AVS & cal for Nov. Sent mess to sch tx.

## 2014-08-01 NOTE — Assessment & Plan Note (Signed)
He found Ambien unhelpful. I will give him a trial of temazepam.

## 2014-08-02 ENCOUNTER — Ambulatory Visit (HOSPITAL_BASED_OUTPATIENT_CLINIC_OR_DEPARTMENT_OTHER): Payer: BC Managed Care – PPO

## 2014-08-02 VITALS — BP 166/81 | HR 110 | Temp 97.6°F | Resp 20

## 2014-08-02 DIAGNOSIS — C8230 Follicular lymphoma grade IIIa, unspecified site: Secondary | ICD-10-CM

## 2014-08-02 MED ORDER — PEGFILGRASTIM INJECTION 6 MG/0.6ML
6.0000 mg | Freq: Once | SUBCUTANEOUS | Status: AC
  Administered 2014-08-02: 6 mg via SUBCUTANEOUS

## 2014-08-02 NOTE — Patient Instructions (Signed)
Pegfilgrastim injection What is this medicine? PEGFILGRASTIM (peg fil GRA stim) is a long-acting granulocyte colony-stimulating factor that stimulates the growth of neutrophils, a type of white blood cell important in the body's fight against infection. It is used to reduce the incidence of fever and infection in patients with certain types of cancer who are receiving chemotherapy that affects the bone marrow. This medicine may be used for other purposes; ask your health care provider or pharmacist if you have questions. COMMON BRAND NAME(S): Neulasta What should I tell my health care provider before I take this medicine? They need to know if you have any of these conditions: -latex allergy -ongoing radiation therapy -sickle cell disease -skin reactions to acrylic adhesives (On-Body Injector only) -an unusual or allergic reaction to pegfilgrastim, filgrastim, other medicines, foods, dyes, or preservatives -pregnant or trying to get pregnant -breast-feeding How should I use this medicine? This medicine is for injection under the skin. If you get this medicine at home, you will be taught how to prepare and give the pre-filled syringe or how to use the On-body Injector. Refer to the patient Instructions for Use for detailed instructions. Use exactly as directed. Take your medicine at regular intervals. Do not take your medicine more often than directed. It is important that you put your used needles and syringes in a special sharps container. Do not put them in a trash can. If you do not have a sharps container, call your pharmacist or healthcare provider to get one. Talk to your pediatrician regarding the use of this medicine in children. Special care may be needed. Overdosage: If you think you have taken too much of this medicine contact a poison control center or emergency room at once. NOTE: This medicine is only for you. Do not share this medicine with others. What if I miss a dose? It is  important not to miss your dose. Call your doctor or health care professional if you miss your dose. If you miss a dose due to an On-body Injector failure or leakage, a new dose should be administered as soon as possible using a single prefilled syringe for manual use. What may interact with this medicine? Interactions have not been studied. Give your health care provider a list of all the medicines, herbs, non-prescription drugs, or dietary supplements you use. Also tell them if you smoke, drink alcohol, or use illegal drugs. Some items may interact with your medicine. This list may not describe all possible interactions. Give your health care provider a list of all the medicines, herbs, non-prescription drugs, or dietary supplements you use. Also tell them if you smoke, drink alcohol, or use illegal drugs. Some items may interact with your medicine. What should I watch for while using this medicine? You may need blood work done while you are taking this medicine. If you are going to need a MRI, CT scan, or other procedure, tell your doctor that you are using this medicine (On-Body Injector only). What side effects may I notice from receiving this medicine? Side effects that you should report to your doctor or health care professional as soon as possible: -allergic reactions like skin rash, itching or hives, swelling of the face, lips, or tongue -dizziness -fever -pain, redness, or irritation at site where injected -pinpoint red spots on the skin -shortness of breath or breathing problems -stomach or side pain, or pain at the shoulder -swelling -tiredness -trouble passing urine Side effects that usually do not require medical attention (report to your doctor   or health care professional if they continue or are bothersome): -bone pain -muscle pain This list may not describe all possible side effects. Call your doctor for medical advice about side effects. You may report side effects to FDA at  1-800-FDA-1088. Where should I keep my medicine? Keep out of the reach of children. Store pre-filled syringes in a refrigerator between 2 and 8 degrees C (36 and 46 degrees F). Do not freeze. Keep in carton to protect from light. Throw away this medicine if it is left out of the refrigerator for more than 48 hours. Throw away any unused medicine after the expiration date. NOTE: This sheet is a summary. It may not cover all possible information. If you have questions about this medicine, talk to your doctor, pharmacist, or health care provider.  2015, [employer]. (2013-12-19 16:14:05)  

## 2014-08-06 ENCOUNTER — Other Ambulatory Visit: Payer: Self-pay | Admitting: Hematology and Oncology

## 2014-08-20 ENCOUNTER — Ambulatory Visit (HOSPITAL_COMMUNITY)
Admission: RE | Admit: 2014-08-20 | Discharge: 2014-08-20 | Disposition: A | Discharge status: Home / Self Care | Payer: BC Managed Care – PPO | Source: Ambulatory Visit | Attending: Hematology and Oncology | Admitting: Hematology and Oncology

## 2014-08-20 ENCOUNTER — Other Ambulatory Visit (HOSPITAL_BASED_OUTPATIENT_CLINIC_OR_DEPARTMENT_OTHER): Payer: BC Managed Care – PPO

## 2014-08-20 DIAGNOSIS — C8230 Follicular lymphoma grade IIIa, unspecified site: Secondary | ICD-10-CM

## 2014-08-20 DIAGNOSIS — C8590 Non-Hodgkin lymphoma, unspecified, unspecified site: Secondary | ICD-10-CM | POA: Diagnosis present

## 2014-08-20 DIAGNOSIS — C8290 Follicular lymphoma, unspecified, unspecified site: Secondary | ICD-10-CM | POA: Diagnosis not present

## 2014-08-20 DIAGNOSIS — R599 Enlarged lymph nodes, unspecified: Secondary | ICD-10-CM | POA: Diagnosis not present

## 2014-08-20 LAB — CBC WITH DIFFERENTIAL/PLATELET
BASO%: 1.3 % (ref 0.0–2.0)
Basophils Absolute: 0.1 10*3/uL (ref 0.0–0.1)
EOS%: 1.1 % (ref 0.0–7.0)
Eosinophils Absolute: 0 10*3/uL (ref 0.0–0.5)
HCT: 40.2 % (ref 38.4–49.9)
HGB: 13.3 g/dL (ref 13.0–17.1)
LYMPH%: 15.6 % (ref 14.0–49.0)
MCH: 29.6 pg (ref 27.2–33.4)
MCHC: 33.1 g/dL (ref 32.0–36.0)
MCV: 89.5 fL (ref 79.3–98.0)
MONO#: 0.5 10*3/uL (ref 0.1–0.9)
MONO%: 11.9 % (ref 0.0–14.0)
NEUT#: 3 10*3/uL (ref 1.5–6.5)
NEUT%: 70.1 % (ref 39.0–75.0)
Platelets: 255 10*3/uL (ref 140–400)
RBC: 4.49 10*6/uL (ref 4.20–5.82)
RDW: 16.7 % — ABNORMAL HIGH (ref 11.0–14.6)
WBC: 4.3 10*3/uL (ref 4.0–10.3)
lymph#: 0.7 10*3/uL — ABNORMAL LOW (ref 0.9–3.3)

## 2014-08-20 LAB — COMPREHENSIVE METABOLIC PANEL (CC13)
ALT: 21 U/L (ref 0–55)
AST: 15 U/L (ref 5–34)
Albumin: 3.8 g/dL (ref 3.5–5.0)
Alkaline Phosphatase: 83 U/L (ref 40–150)
Anion Gap: 9 mEq/L (ref 3–11)
BUN: 10 mg/dL (ref 7.0–26.0)
CO2: 26 mEq/L (ref 22–29)
Calcium: 9.4 mg/dL (ref 8.4–10.4)
Chloride: 103 mEq/L (ref 98–109)
Creatinine: 0.8 mg/dL (ref 0.7–1.3)
Glucose: 104 mg/dl (ref 70–140)
Potassium: 4.4 mEq/L (ref 3.5–5.1)
Sodium: 138 mEq/L (ref 136–145)
Total Bilirubin: 0.27 mg/dL (ref 0.20–1.20)
Total Protein: 6.5 g/dL (ref 6.4–8.3)

## 2014-08-20 LAB — GLUCOSE, CAPILLARY: Glucose-Capillary: 99 mg/dL (ref 70–99)

## 2014-08-20 MED ORDER — FLUDEOXYGLUCOSE F - 18 (FDG) INJECTION
8.4200 | Freq: Once | INTRAVENOUS | Status: AC | PRN
  Administered 2014-08-20: 8.42 via INTRAVENOUS

## 2014-08-22 ENCOUNTER — Ambulatory Visit (HOSPITAL_BASED_OUTPATIENT_CLINIC_OR_DEPARTMENT_OTHER): Payer: BC Managed Care – PPO | Admitting: Hematology and Oncology

## 2014-08-22 ENCOUNTER — Ambulatory Visit (HOSPITAL_BASED_OUTPATIENT_CLINIC_OR_DEPARTMENT_OTHER): Payer: BC Managed Care – PPO

## 2014-08-22 ENCOUNTER — Encounter: Payer: Self-pay | Admitting: Hematology and Oncology

## 2014-08-22 VITALS — BP 149/90 | HR 106 | Temp 98.3°F | Resp 18 | Ht 70.0 in | Wt 166.1 lb

## 2014-08-22 DIAGNOSIS — R112 Nausea with vomiting, unspecified: Secondary | ICD-10-CM

## 2014-08-22 DIAGNOSIS — C8241 Follicular lymphoma grade IIIb, lymph nodes of head, face, and neck: Secondary | ICD-10-CM

## 2014-08-22 DIAGNOSIS — C8230 Follicular lymphoma grade IIIa, unspecified site: Secondary | ICD-10-CM

## 2014-08-22 DIAGNOSIS — Z5111 Encounter for antineoplastic chemotherapy: Secondary | ICD-10-CM

## 2014-08-22 DIAGNOSIS — G971 Other reaction to spinal and lumbar puncture: Secondary | ICD-10-CM

## 2014-08-22 DIAGNOSIS — G44021 Chronic cluster headache, intractable: Secondary | ICD-10-CM

## 2014-08-22 DIAGNOSIS — Z5112 Encounter for antineoplastic immunotherapy: Secondary | ICD-10-CM

## 2014-08-22 HISTORY — DX: Nausea with vomiting, unspecified: R11.2

## 2014-08-22 MED ORDER — SODIUM CHLORIDE 0.9 % IJ SOLN
10.0000 mL | INTRAMUSCULAR | Status: DC | PRN
  Administered 2014-08-22: 10 mL
  Filled 2014-08-22: qty 10

## 2014-08-22 MED ORDER — ONDANSETRON 16 MG/50ML IVPB (CHCC)
INTRAVENOUS | Status: AC
  Filled 2014-08-22: qty 16

## 2014-08-22 MED ORDER — RITUXIMAB CHEMO INJECTION 500 MG/50ML
375.0000 mg/m2 | Freq: Once | INTRAVENOUS | Status: AC
  Administered 2014-08-22: 700 mg via INTRAVENOUS
  Filled 2014-08-22: qty 70

## 2014-08-22 MED ORDER — DEXAMETHASONE SODIUM PHOSPHATE 20 MG/5ML IJ SOLN
INTRAMUSCULAR | Status: AC
Start: 2014-08-22 — End: 2014-08-22
  Filled 2014-08-22: qty 5

## 2014-08-22 MED ORDER — ONDANSETRON 16 MG/50ML IVPB (CHCC)
16.0000 mg | Freq: Once | INTRAVENOUS | Status: AC
  Administered 2014-08-22: 16 mg via INTRAVENOUS

## 2014-08-22 MED ORDER — SODIUM CHLORIDE 0.9 % IV SOLN
Freq: Once | INTRAVENOUS | Status: AC
  Administered 2014-08-22: 10:00:00 via INTRAVENOUS

## 2014-08-22 MED ORDER — DEXAMETHASONE SODIUM PHOSPHATE 20 MG/5ML IJ SOLN
20.0000 mg | Freq: Once | INTRAMUSCULAR | Status: AC
  Administered 2014-08-22: 20 mg via INTRAVENOUS

## 2014-08-22 MED ORDER — VINCRISTINE SULFATE CHEMO INJECTION 1 MG/ML
2.0000 mg | Freq: Once | INTRAVENOUS | Status: AC
  Administered 2014-08-22: 2 mg via INTRAVENOUS
  Filled 2014-08-22: qty 2

## 2014-08-22 MED ORDER — BUTALBITAL-APAP-CAFFEINE 50-325-40 MG PO TABS: 1.0000 | ORAL_TABLET | Freq: Four times a day (QID) | ORAL | Status: DC | PRN

## 2014-08-22 MED ORDER — DIPHENHYDRAMINE HCL 25 MG PO CAPS
50.0000 mg | ORAL_CAPSULE | Freq: Once | ORAL | Status: AC
  Administered 2014-08-22: 50 mg via ORAL

## 2014-08-22 MED ORDER — ONDANSETRON HCL 8 MG PO TABS: 8.0000 mg | ORAL_TABLET | Freq: Three times a day (TID) | ORAL | Status: DC | PRN

## 2014-08-22 MED ORDER — HEPARIN SOD (PORK) LOCK FLUSH 100 UNIT/ML IV SOLN
500.0000 [IU] | Freq: Once | INTRAVENOUS | Status: AC | PRN
  Administered 2014-08-22: 500 [IU]
  Filled 2014-08-22: qty 5

## 2014-08-22 MED ORDER — ACETAMINOPHEN 325 MG PO TABS
650.0000 mg | ORAL_TABLET | Freq: Once | ORAL | Status: AC
  Administered 2014-08-22: 650 mg via ORAL

## 2014-08-22 MED ORDER — ACETAMINOPHEN 325 MG PO TABS
ORAL_TABLET | ORAL | Status: AC
  Filled 2014-08-22: qty 2

## 2014-08-22 MED ORDER — CYCLOPHOSPHAMIDE CHEMO INJECTION 1 GM
750.0000 mg/m2 | Freq: Once | INTRAMUSCULAR | Status: AC
  Administered 2014-08-22: 1460 mg via INTRAVENOUS
  Filled 2014-08-22: qty 73

## 2014-08-22 MED ORDER — DOXORUBICIN HCL CHEMO IV INJECTION 2 MG/ML
50.0000 mg/m2 | Freq: Once | INTRAVENOUS | Status: AC
  Administered 2014-08-22: 98 mg via INTRAVENOUS
  Filled 2014-08-22: qty 49

## 2014-08-22 NOTE — Assessment & Plan Note (Signed)
This was related to recent intrathecal chemotherapy. I will proceed to give him prescription for pain. We will omit future doses of intrathecal treatment.

## 2014-08-22 NOTE — Progress Notes (Signed)
[place] OFFICE PROGRESS NOTE  Patient Care Team: Christain Sacramento, MD as PCP - General (Family Medicine) Jackolyn Confer, MD as Referring Physician (General Surgery) Heath Lark, MD as Consulting Physician (Hematology and Oncology)  SUMMARY OF ONCOLOGIC HISTORY: Oncology History   Follicular lymphoma grade 3a   Primary site: Lymphoid Neoplasms   Staging method: AJCC 6th Edition   Clinical free text: Bone marrow is involved on PET; Possible component of DLBCL   Clinical: Stage IV signed by Heath Lark, MD on 06/18/2014  8:05 PM   Summary: Stage IV       Follicular lymphoma grade 3a   05/13/2014 Imaging CT scan of chest and neck showed extensive lymphadenopathy in the neck and medistinum   05/19/2014 Procedure [id number] US biopsy only showed necrotic tissue   06/03/2014 Surgery Excisional LN biopsy of right supraclavicular region showed follicular lymphoma grade 3 [id number]   06/14/2014 Imaging PET CT scan showed diffuse, bulky lymphadenopathy, splenic involvement and bone marrow disease   06/16/2014 Imaging ECHO showed preserved EF 50%   06/17/2014 Procedure He has port placement   06/20/2014 -  Chemotherapy He received cycle 1 of chemotherapy with R. CHOP along with intrathecal methotrexate. He received 2 doses of IT MTX   08/20/2014 Imaging Repeat PET/CT scan showed significant improvement with near-complete response to treatment.    INTERVAL HISTORY: Please see below for problem oriented charting. He is seen prior to cycle 4 treatment. He has some mild nausea vomiting for a few days after chemotherapy, resolved with antiemetics. He still has very mild persistent headache after his last intrathecal treatment.  REVIEW OF SYSTEMS:   Constitutional: Denies fevers, chills or abnormal weight loss Eyes: Denies blurriness of vision Ears, nose, mouth, throat, and face: Denies mucositis or sore throat Respiratory: Denies cough, dyspnea or wheezes Cardiovascular: Denies  palpitation, chest discomfort or lower extremity swelling Gastrointestinal:  Denies nausea, heartburn or change in bowel habits Skin: Denies abnormal skin rashes Lymphatics: Denies new lymphadenopathy or easy bruising Neurological:Denies numbness, tingling or new weaknesses Behavioral/Psych: Mood is stable, no new changes  All other systems were reviewed with the patient and are negative.  I have reviewed the past medical history, past surgical history, social history and family history with the patient and they are unchanged from previous note.  ALLERGIES:  has No Known Allergies.  MEDICATIONS:  Current Outpatient Prescriptions  Medication Sig Dispense Refill  . butalbital-acetaminophen-caffeine (FIORICET) 50-325-40 MG per tablet Take 1 tablet by mouth every 6 (six) hours as needed for headache. 60 tablet 0  . cholecalciferol (VITAMIN D) 1000 UNITS tablet Take 1,000 Units by mouth daily.    Marland Kitchen lidocaine-prilocaine (EMLA) cream Apply 1 application topically as needed. 30 g 6  . Melatonin 5 MG CAPS Take 10 mg by mouth at bedtime as needed (for sleep).     . ondansetron (ZOFRAN) 8 MG tablet Take 1 tablet (8 mg total) by mouth every 8 (eight) hours as needed for nausea or vomiting. 30 tablet 1  . oxyCODONE (ROXICODONE) 15 MG immediate release tablet Take 1 tablet (15 mg total) by mouth every 4 (four) hours as needed for severe pain. 40 tablet 0  . oxyCODONE 10 MG TABS Take 1 tablet (10 mg total) by mouth every 4 (four) hours as needed for severe pain. 30 tablet 0  . polyethylene glycol (MIRALAX / GLYCOLAX) packet Take 17 g by mouth daily as needed.    . predniSONE (DELTASONE) 20 MG tablet Take 3 tablets (60  mg total) by mouth daily. Take on days 1-5 of chemotherapy. 60 tablet 6  . Probiotic Product (PROBIOTIC DAILY PO) Take 1 capsule by mouth daily.    . prochlorperazine (COMPAZINE) 10 MG tablet Take 1 tablet (10 mg total) by mouth every 6 (six) hours as needed (Nausea or vomiting). 30 tablet 6   . temazepam (RESTORIL) 15 MG capsule Take 1 capsule (15 mg total) by mouth at bedtime as needed for sleep. 30 capsule 0  . Turmeric 500 MG CAPS Take 500 mg by mouth daily.     No current facility-administered medications for this visit.    PHYSICAL EXAMINATION: ECOG PERFORMANCE STATUS: 0 - Asymptomatic  Filed Vitals:   08/22/14 0944  BP: 149/90  Pulse: 106  Temp: 98.3 F (36.8 C)  Resp: 18   Filed Weights   08/22/14 0944  Weight: 166 lb 1.6 oz (75.342 kg)    GENERAL:alert, no distress and comfortable SKIN: skin color, texture, turgor are normal, no rashes or significant lesions EYES: normal, Conjunctiva are pink and non-injected, sclera clear OROPHARYNX:no exudate, no erythema and lips, buccal mucosa, and tongue normal  NECK: supple, thyroid normal size, non-tender, without nodularity LYMPH:  Previously palpable lymphadenopathy has regressed in size. LUNGS: clear to auscultation and percussion with normal breathing effort HEART: regular rate & rhythm and no murmurs and no lower extremity edema ABDOMEN:abdomen soft, non-tender and normal bowel sounds Musculoskeletal:no cyanosis of digits and no clubbing  NEURO: alert & oriented x 3 with fluent speech, no focal motor/sensory deficits  LABORATORY DATA:  I have reviewed the data as listed    Component Value Date/Time   NA 138 08/20/2014 1021   NA 138 06/02/2014 1350   K 4.4 08/20/2014 1021   K 4.4 06/02/2014 1350   CL 98 06/02/2014 1350   CO2 26 08/20/2014 1021   CO2 28 06/02/2014 1350   GLUCOSE 104 08/20/2014 1021   GLUCOSE 85 06/02/2014 1350   BUN 10.0 08/20/2014 1021   BUN 12 06/02/2014 1350   CREATININE 0.8 08/20/2014 1021   CREATININE 0.97 06/02/2014 1350   CALCIUM 9.4 08/20/2014 1021   CALCIUM 9.5 06/02/2014 1350   PROT 6.5 08/20/2014 1021   PROT 7.3 06/02/2014 1350   ALBUMIN 3.8 08/20/2014 1021   ALBUMIN 3.9 06/02/2014 1350   AST 15 08/20/2014 1021   AST 23 06/02/2014 1350   ALT 21 08/20/2014 1021    ALT 27 06/02/2014 1350   ALKPHOS 83 08/20/2014 1021   ALKPHOS 75 06/02/2014 1350   BILITOT 0.27 08/20/2014 1021   BILITOT 0.3 06/02/2014 1350   GFRNONAA 90* 06/02/2014 1350   GFRAA >90 06/02/2014 1350    No results found for: SPEP, UPEP  Lab Results  Component Value Date   WBC 4.3 08/20/2014   NEUTROABS 3.0 08/20/2014   HGB 13.3 08/20/2014   HCT 40.2 08/20/2014   MCV 89.5 08/20/2014   PLT 255 08/20/2014      Chemistry      Component Value Date/Time   NA 138 08/20/2014 1021   NA 138 06/02/2014 1350   K 4.4 08/20/2014 1021   K 4.4 06/02/2014 1350   CL 98 06/02/2014 1350   CO2 26 08/20/2014 1021   CO2 28 06/02/2014 1350   BUN 10.0 08/20/2014 1021   BUN 12 06/02/2014 1350   CREATININE 0.8 08/20/2014 1021   CREATININE 0.97 06/02/2014 1350      Component Value Date/Time   CALCIUM 9.4 08/20/2014 1021   CALCIUM 9.5 06/02/2014 1350  ALKPHOS 83 08/20/2014 1021   ALKPHOS 75 06/02/2014 1350   AST 15 08/20/2014 1021   AST 23 06/02/2014 1350   ALT 21 08/20/2014 1021   ALT 27 06/02/2014 1350   BILITOT 0.27 08/20/2014 1021   BILITOT 0.3 06/02/2014 1350       RADIOGRAPHIC STUDIES: I have personally reviewed the radiological images as listed and agreed with the findings in the report. [facility] Pet Image Restag (ps) Skull Base To Thigh  08/20/2014   CLINICAL DATA:  Subsequent treatment strategy for lymphoma. Follicular lymphoma.  EXAM: NUCLEAR MEDICINE PET SKULL BASE TO THIGH  TECHNIQUE: 8.4 mCi F-18 FDG was injected intravenously. Full-ring PET imaging was performed from the skull base to thigh after the radiotracer. CT data was obtained and used for attenuation correction and anatomic localization.  FASTING BLOOD GLUCOSE:  Value: 99 Mg/dl  COMPARISON:  PET-CT 06/14/2014  FINDINGS: NECK  No residual enlarged or hypermetabolic cervical lymph nodes.  CHEST  Reduction in size and metabolic activity of right paratracheal adenopathy. Metabolic activity of enlarged paratracheal lymph nodes  is above blood pool (SUV 3.8, Deauville 3) but below liver activity. Metabolic activity is decreased significantly from SUV max 11.1 on comparison exam. Additionally the adenopathy decrease in size with a right paratracheal lymph node measuring 2.3 cm in short axis compared to 4.0 cm on prior. Likewise subcarinal lymph node has mild metabolic activity (SUV max 3.2 decreased from 11.  Resolution of the internal mammary metabolic adenopathy seen on prior.  Review of the pulmonary parenchyma demonstrates no suspicious nodularity. Cystic lesion in the right neck has no metabolic activity.  ABDOMEN/PELVIS  Resolution of the periaortic adenopathy in the abdomen. Small peritoneal nodular implant along the left rectus abdominus muscle in the upper pelvis is decreased in size and metabolic activity fairly identified on image 297.  SKELETON  Reduction in metabolic activity of skeletal lesion. Lesion in the posterior left acetabulum with SUV max = 3.5 decreased 8.6. Review skeletal lesions  IMPRESSION: 1. Partial positive response to chemotherapy with reduction in size and metabolic activity of bulky mediastinal and cervical adenopathy. 2. Resolution of metabolic activity in periaortic lymph nodes, peritoneal lymph nodes and iliac lymph nodes. 3. Residual metabolic activity with enlarged mediastinal lymph nodes and iliac skeletal lesions (Deauville 3)   Electronically Signed   By: Suzy Bouchard M.D.   On: 08/20/2014 13:09     ASSESSMENT & PLAN:  Follicular lymphoma grade 3a He tolerated treatment extremely well with expected side effects such as mild nausea. PET CT scan show excellent response to treatment. We will proceed with total 6 cycles of treatment.   Spinal headache This was related to recent intrathecal chemotherapy. I will proceed to give him prescription for pain. We will omit future doses of intrathecal treatment.    Nausea with vomiting I recommend round-the-clock antiemetics for a few days  after chemotherapy to prevent nausea.   No orders of the defined types were placed in this encounter.   All questions were answered. The patient knows to call the clinic with any problems, questions or concerns. No barriers to learning was detected. I spent 30 minutes counseling the patient face to face. The total time spent in the appointment was 40 minutes and more than 50% was on counseling and review of test results     [facility], [place], MD 08/22/2014 10:08 AM

## 2014-08-22 NOTE — Patient Instructions (Signed)
[facility] Discharge Instructions for Patients Receiving Chemotherapy  Today you received the following chemotherapy agents doxorubicin/vincristine/cytoxan/rituxan.   To help prevent nausea and vomiting after your treatment, we encourage you to take your nausea medication as directed.    If you develop nausea and vomiting that is not controlled by your nausea medication, call the clinic.   BELOW ARE SYMPTOMS THAT SHOULD BE REPORTED IMMEDIATELY:  *FEVER GREATER THAN 100.5 F  *CHILLS WITH OR WITHOUT FEVER  NAUSEA AND VOMITING THAT IS NOT CONTROLLED WITH YOUR NAUSEA MEDICATION  *UNUSUAL SHORTNESS OF BREATH  *UNUSUAL BRUISING OR BLEEDING  TENDERNESS IN MOUTH AND THROAT WITH OR WITHOUT PRESENCE OF ULCERS  *URINARY PROBLEMS  *BOWEL PROBLEMS  UNUSUAL RASH Items with * indicate a potential emergency and should be followed up as soon as possible.  Feel free to call the clinic you have any questions or concerns. The clinic phone number is [phone].  

## 2014-08-22 NOTE — Assessment & Plan Note (Signed)
I recommend round-the-clock antiemetics for a few days after chemotherapy to prevent nausea.

## 2014-08-22 NOTE — Assessment & Plan Note (Signed)
He tolerated treatment extremely well with expected side effects such as mild nausea. PET CT scan show excellent response to treatment. We will proceed with total 6 cycles of treatment.

## 2014-08-23 ENCOUNTER — Ambulatory Visit: Payer: BC Managed Care – PPO

## 2014-08-23 ENCOUNTER — Telehealth: Payer: Self-pay | Admitting: *Deleted

## 2014-08-23 NOTE — Telephone Encounter (Signed)
Called pt & he reports that he has tried to call [facility] a couple of times & reached answering service & explained that he was too nauseated to drive 30" to get here.  He states that he has taken compazine.  He has not taken zofran.  Suggested that he take the zofran & see if that helps & cont. zofran & compazine until under control.  He would like to come in Monday for his neulasta.  A note will be left for Dr Alvy Bimler & asked pt to call 1st thing Monday am.

## 2014-08-25 ENCOUNTER — Other Ambulatory Visit: Payer: Self-pay | Admitting: Hematology and Oncology

## 2014-08-25 ENCOUNTER — Ambulatory Visit (HOSPITAL_BASED_OUTPATIENT_CLINIC_OR_DEPARTMENT_OTHER): Payer: BC Managed Care – PPO

## 2014-08-25 ENCOUNTER — Telehealth: Payer: Self-pay | Admitting: *Deleted

## 2014-08-25 ENCOUNTER — Telehealth: Payer: Self-pay | Admitting: Hematology and Oncology

## 2014-08-25 DIAGNOSIS — C8230 Follicular lymphoma grade IIIa, unspecified site: Secondary | ICD-10-CM

## 2014-08-25 DIAGNOSIS — C8241 Follicular lymphoma grade IIIb, lymph nodes of head, face, and neck: Secondary | ICD-10-CM

## 2014-08-25 DIAGNOSIS — Z5189 Encounter for other specified aftercare: Secondary | ICD-10-CM

## 2014-08-25 MED ORDER — PEGFILGRASTIM INJECTION 6 MG/0.6ML ~~LOC~~
6.0000 mg | PREFILLED_SYRINGE | Freq: Once | SUBCUTANEOUS | Status: AC
  Administered 2014-08-25: 6 mg via SUBCUTANEOUS
  Filled 2014-08-25: qty 0.6

## 2014-08-25 NOTE — Telephone Encounter (Signed)
added appt per staff....pt already aware per staff msg

## 2014-08-25 NOTE — Telephone Encounter (Signed)
Pt missed his Neulasta on Saturday due to being too nauseated to come in.  He states feels much better today, not nauseated and is pushing fluids.  Instructed pt to come at 12:45 pm for Neulasta injection today.  It is ok w/ Dr. Alvy Bimler for him to get injection today.  Pt verbalized understanding.

## 2014-09-12 ENCOUNTER — Ambulatory Visit (HOSPITAL_BASED_OUTPATIENT_CLINIC_OR_DEPARTMENT_OTHER): Payer: BC Managed Care – PPO

## 2014-09-12 ENCOUNTER — Other Ambulatory Visit (HOSPITAL_BASED_OUTPATIENT_CLINIC_OR_DEPARTMENT_OTHER): Payer: BC Managed Care – PPO

## 2014-09-12 ENCOUNTER — Telehealth: Payer: Self-pay | Admitting: Hematology and Oncology

## 2014-09-12 ENCOUNTER — Ambulatory Visit (HOSPITAL_BASED_OUTPATIENT_CLINIC_OR_DEPARTMENT_OTHER): Payer: BC Managed Care – PPO | Admitting: Hematology and Oncology

## 2014-09-12 ENCOUNTER — Encounter: Payer: Self-pay | Admitting: Hematology and Oncology

## 2014-09-12 VITALS — BP 137/80 | HR 121 | Temp 98.2°F | Resp 18 | Ht 70.0 in | Wt 166.6 lb

## 2014-09-12 DIAGNOSIS — Z5111 Encounter for antineoplastic chemotherapy: Secondary | ICD-10-CM

## 2014-09-12 DIAGNOSIS — G971 Other reaction to spinal and lumbar puncture: Secondary | ICD-10-CM

## 2014-09-12 DIAGNOSIS — C8230 Follicular lymphoma grade IIIa, unspecified site: Secondary | ICD-10-CM

## 2014-09-12 DIAGNOSIS — R112 Nausea with vomiting, unspecified: Secondary | ICD-10-CM

## 2014-09-12 DIAGNOSIS — C8238 Follicular lymphoma grade IIIa, lymph nodes of multiple sites: Secondary | ICD-10-CM

## 2014-09-12 DIAGNOSIS — G4700 Insomnia, unspecified: Secondary | ICD-10-CM

## 2014-09-12 DIAGNOSIS — Z5112 Encounter for antineoplastic immunotherapy: Secondary | ICD-10-CM

## 2014-09-12 DIAGNOSIS — G44021 Chronic cluster headache, intractable: Secondary | ICD-10-CM

## 2014-09-12 DIAGNOSIS — G4489 Other headache syndrome: Secondary | ICD-10-CM

## 2014-09-12 DIAGNOSIS — R1115 Cyclical vomiting syndrome unrelated to migraine: Secondary | ICD-10-CM

## 2014-09-12 LAB — COMPREHENSIVE METABOLIC PANEL (CC13)
ALT: 28 U/L (ref 0–55)
AST: 15 U/L (ref 5–34)
Albumin: 4 g/dL (ref 3.5–5.0)
Alkaline Phosphatase: 81 U/L (ref 40–150)
Anion Gap: 8 mEq/L (ref 3–11)
BUN: 16.6 mg/dL (ref 7.0–26.0)
CO2: 28 mEq/L (ref 22–29)
Calcium: 9.5 mg/dL (ref 8.4–10.4)
Chloride: 102 mEq/L (ref 98–109)
Creatinine: 0.8 mg/dL (ref 0.7–1.3)
EGFR: >90 mL/min/{1.73_m2} (ref >90)
Glucose: 111 mg/dl (ref 70–140)
Potassium: 4.9 mEq/L (ref 3.5–5.1)
Sodium: 138 mEq/L (ref 136–145)
Total Bilirubin: 0.28 mg/dL (ref 0.20–1.20)
Total Protein: 6.7 g/dL (ref 6.4–8.3)

## 2014-09-12 LAB — CBC WITH DIFFERENTIAL/PLATELET
BASO%: 0.1 % (ref 0.0–2.0)
Basophils Absolute: 0 10*3/uL (ref 0.0–0.1)
EOS%: 0.1 % (ref 0.0–7.0)
Eosinophils Absolute: 0 10*3/uL (ref 0.0–0.5)
HCT: 39.5 % (ref 38.4–49.9)
HGB: 13 g/dL (ref 13.0–17.1)
LYMPH%: 3.7 % — ABNORMAL LOW (ref 14.0–49.0)
MCH: 30.2 pg (ref 27.2–33.4)
MCHC: 32.9 g/dL (ref 32.0–36.0)
MCV: 91.9 fL (ref 79.3–98.0)
MONO#: 0.1 10*3/uL (ref 0.1–0.9)
MONO%: 1.5 % (ref 0.0–14.0)
NEUT#: 8.7 10*3/uL — ABNORMAL HIGH (ref 1.5–6.5)
NEUT%: 94.6 % — ABNORMAL HIGH (ref 39.0–75.0)
Platelets: 291 10*3/uL (ref 140–400)
RBC: 4.3 10*6/uL (ref 4.20–5.82)
RDW: 16.1 % — ABNORMAL HIGH (ref 11.0–14.6)
WBC: 9.2 10*3/uL (ref 4.0–10.3)
lymph#: 0.3 10*3/uL — ABNORMAL LOW (ref 0.9–3.3)

## 2014-09-12 MED ORDER — DIPHENHYDRAMINE HCL 25 MG PO CAPS
ORAL_CAPSULE | ORAL | Status: AC
  Filled 2014-09-12: qty 2

## 2014-09-12 MED ORDER — SODIUM CHLORIDE 0.9 % IJ SOLN
10.0000 mL | INTRAMUSCULAR | Status: DC | PRN
  Administered 2014-09-12: 10 mL
  Filled 2014-09-12: qty 10

## 2014-09-12 MED ORDER — DEXAMETHASONE SODIUM PHOSPHATE 20 MG/5ML IJ SOLN
INTRAMUSCULAR | Status: AC
  Filled 2014-09-12: qty 5

## 2014-09-12 MED ORDER — SODIUM CHLORIDE 0.9 % IV SOLN
750.0000 mg/m2 | Freq: Once | INTRAVENOUS | Status: AC
  Administered 2014-09-12: 1460 mg via INTRAVENOUS
  Filled 2014-09-12: qty 73

## 2014-09-12 MED ORDER — DEXAMETHASONE SODIUM PHOSPHATE 20 MG/5ML IJ SOLN
20.0000 mg | Freq: Once | INTRAMUSCULAR | Status: AC
  Administered 2014-09-12: 20 mg via INTRAVENOUS

## 2014-09-12 MED ORDER — DIPHENHYDRAMINE HCL 25 MG PO CAPS
50.0000 mg | ORAL_CAPSULE | Freq: Once | ORAL | Status: AC
  Administered 2014-09-12: 50 mg via ORAL

## 2014-09-12 MED ORDER — ONDANSETRON 16 MG/50ML IVPB (CHCC)
INTRAVENOUS | Status: AC
  Filled 2014-09-12: qty 16

## 2014-09-12 MED ORDER — SODIUM CHLORIDE 0.9 % IV SOLN
375.0000 mg/m2 | Freq: Once | INTRAVENOUS | Status: AC
  Administered 2014-09-12: 700 mg via INTRAVENOUS
  Filled 2014-09-12: qty 70

## 2014-09-12 MED ORDER — HEPARIN SOD (PORK) LOCK FLUSH 100 UNIT/ML IV SOLN
500.0000 [IU] | Freq: Once | INTRAVENOUS | Status: AC | PRN
  Administered 2014-09-12: 500 [IU]
  Filled 2014-09-12: qty 5

## 2014-09-12 MED ORDER — DOXORUBICIN HCL CHEMO IV INJECTION 2 MG/ML
50.0000 mg/m2 | Freq: Once | INTRAVENOUS | Status: AC
  Administered 2014-09-12: 98 mg via INTRAVENOUS
  Filled 2014-09-12: qty 49

## 2014-09-12 MED ORDER — ONDANSETRON 16 MG/50ML IVPB (CHCC)
16.0000 mg | Freq: Once | INTRAVENOUS | Status: AC
  Administered 2014-09-12: 16 mg via INTRAVENOUS

## 2014-09-12 MED ORDER — BUTALBITAL-APAP-CAFFEINE 50-325-40 MG PO TABS: 1.0000 | ORAL_TABLET | Freq: Four times a day (QID) | ORAL | Status: DC | PRN

## 2014-09-12 MED ORDER — VINCRISTINE SULFATE CHEMO INJECTION 1 MG/ML
2.0000 mg | Freq: Once | INTRAVENOUS | Status: AC
  Administered 2014-09-12: 2 mg via INTRAVENOUS
  Filled 2014-09-12: qty 2

## 2014-09-12 MED ORDER — SODIUM CHLORIDE 0.9 % IV SOLN
Freq: Once | INTRAVENOUS | Status: AC
  Administered 2014-09-12: 11:00:00 via INTRAVENOUS

## 2014-09-12 MED ORDER — ACETAMINOPHEN 325 MG PO TABS
ORAL_TABLET | ORAL | Status: AC
  Filled 2014-09-12: qty 2

## 2014-09-12 MED ORDER — ALPRAZOLAM 0.25 MG PO TABS: 0.2500 mg | ORAL_TABLET | Freq: Every evening | ORAL | Status: DC | PRN

## 2014-09-12 MED ORDER — ACETAMINOPHEN 325 MG PO TABS
650.0000 mg | ORAL_TABLET | Freq: Once | ORAL | Status: AC
  Administered 2014-09-12: 650 mg via ORAL

## 2014-09-12 NOTE — Patient Instructions (Signed)
[place] Discharge Instructions for Patients Receiving Chemotherapy  Today you received the following chemotherapy agents RCHOP.  To help prevent nausea and vomiting after your treatment, we encourage you to take your nausea medication as directed.    If you develop nausea and vomiting that is not controlled by your nausea medication, call the clinic.   BELOW ARE SYMPTOMS THAT SHOULD BE REPORTED IMMEDIATELY:  *FEVER GREATER THAN 100.5 F  *CHILLS WITH OR WITHOUT FEVER  NAUSEA AND VOMITING THAT IS NOT CONTROLLED WITH YOUR NAUSEA MEDICATION  *UNUSUAL SHORTNESS OF BREATH  *UNUSUAL BRUISING OR BLEEDING  TENDERNESS IN MOUTH AND THROAT WITH OR WITHOUT PRESENCE OF ULCERS  *URINARY PROBLEMS  *BOWEL PROBLEMS  UNUSUAL RASH Items with * indicate a potential emergency and should be followed up as soon as possible.  Feel free to call the clinic you have any questions or concerns. The clinic phone number is [phone].

## 2014-09-12 NOTE — Progress Notes (Signed)
Left messages on patient's home and cell phones to let him know he's been referred to [place] for a procedure (blood patch) and to call us at [phone].  His work number is disconnected.  Brita Romp, RN

## 2014-09-12 NOTE — Telephone Encounter (Signed)
gv adn printed appt sched and avs for pt for Jan...per Tiffany spine will call pt.

## 2014-09-13 ENCOUNTER — Ambulatory Visit: Payer: BC Managed Care – PPO

## 2014-09-14 NOTE — Assessment & Plan Note (Addendum)
This is related to his last lumbar puncture procedure. I recommend consideration for blood patch and he agreed to proceed. I refilled his prescription for headaches.

## 2014-09-14 NOTE — Progress Notes (Signed)
[place] OFFICE PROGRESS NOTE  Patient Care Team: Christain Sacramento, MD as PCP - General (Family Medicine) Jackolyn Confer, MD as Referring Physician (General Surgery) Heath Lark, MD as Consulting Physician (Hematology and Oncology)  SUMMARY OF ONCOLOGIC HISTORY: Oncology History   Follicular lymphoma grade 3a   Primary site: Lymphoid Neoplasms   Staging method: AJCC 6th Edition   Clinical free text: Bone marrow is involved on PET; Possible component of DLBCL   Clinical: Stage IV signed by Heath Lark, MD on 06/18/2014  8:05 PM   Summary: Stage IV       Follicular lymphoma grade 3a   05/13/2014 Imaging CT scan of chest and neck showed extensive lymphadenopathy in the neck and medistinum   05/19/2014 Procedure [id number] US biopsy only showed necrotic tissue   06/03/2014 Surgery Excisional LN biopsy of right supraclavicular region showed follicular lymphoma grade 3 [id number]   06/14/2014 Imaging PET CT scan showed diffuse, bulky lymphadenopathy, splenic involvement and bone marrow disease   06/16/2014 Imaging ECHO showed preserved EF 50%   06/17/2014 Procedure He has port placement   06/20/2014 -  Chemotherapy He received cycle 1 of chemotherapy with R. CHOP along with intrathecal methotrexate. He received 2 doses of IT MTX   08/20/2014 Imaging Repeat PET/CT scan showed significant improvement with near-complete response to treatment.    INTERVAL HISTORY: Please see below for problem oriented charting. He returns for cycle 5 of treatment. He still had persistent headache and nausea. He complained of insomnia. Has very trace peripheral neuropathy.  REVIEW OF SYSTEMS:   Constitutional: Denies fevers, chills or abnormal weight loss Eyes: Denies blurriness of vision Ears, nose, mouth, throat, and face: Denies mucositis or sore throat Respiratory: Denies cough, dyspnea or wheezes Cardiovascular: Denies palpitation, chest discomfort or lower extremity swelling Gastrointestinal:   Denies nausea, heartburn or change in bowel habits Skin: Denies abnormal skin rashes Lymphatics: Denies new lymphadenopathy or easy bruising Neurological:Denies numbness, tingling or new weaknesses Behavioral/Psych: Mood is stable, no new changes  All other systems were reviewed with the patient and are negative.  I have reviewed the past medical history, past surgical history, social history and family history with the patient and they are unchanged from previous note.  ALLERGIES:  has No Known Allergies.  MEDICATIONS:  Current Outpatient Prescriptions  Medication Sig Dispense Refill  . butalbital-acetaminophen-caffeine (FIORICET) 50-325-40 MG per tablet Take 1 tablet by mouth every 6 (six) hours as needed for headache. 30 tablet 0  . cholecalciferol (VITAMIN D) 1000 UNITS tablet Take 1,000 Units by mouth daily.    Marland Kitchen lidocaine-prilocaine (EMLA) cream Apply 1 application topically as needed. 30 g 6  . Melatonin 5 MG CAPS Take 10 mg by mouth at bedtime as needed (for sleep).     . ondansetron (ZOFRAN) 8 MG tablet Take 1 tablet (8 mg total) by mouth every 8 (eight) hours as needed for nausea or vomiting. 30 tablet 1  . polyethylene glycol (MIRALAX / GLYCOLAX) packet Take 17 g by mouth daily as needed.    . predniSONE (DELTASONE) 20 MG tablet Take 3 tablets (60 mg total) by mouth daily. Take on days 1-5 of chemotherapy. 60 tablet 6  . Probiotic Product (PROBIOTIC DAILY PO) Take 1 capsule by mouth daily.    . prochlorperazine (COMPAZINE) 10 MG tablet Take 1 tablet (10 mg total) by mouth every 6 (six) hours as needed (Nausea or vomiting). 30 tablet 6  . Turmeric 500 MG CAPS Take 500 mg by mouth  daily.    Marland Kitchen ALPRAZolam (XANAX) 0.25 MG tablet Take 1 tablet (0.25 mg total) by mouth at bedtime as needed for anxiety or sleep. 30 tablet 0  . oxyCODONE (ROXICODONE) 15 MG immediate release tablet Take 1 tablet (15 mg total) by mouth every 4 (four) hours as needed for severe pain. (Patient not taking:  Reported on 09/12/2014) 40 tablet 0  . oxyCODONE 10 MG TABS Take 1 tablet (10 mg total) by mouth every 4 (four) hours as needed for severe pain. (Patient not taking: Reported on 09/12/2014) 30 tablet 0  . temazepam (RESTORIL) 15 MG capsule Take 1 capsule (15 mg total) by mouth at bedtime as needed for sleep. (Patient not taking: Reported on 09/12/2014) 30 capsule 0   No current facility-administered medications for this visit.    PHYSICAL EXAMINATION: ECOG PERFORMANCE STATUS: 1 - Symptomatic but completely ambulatory  Filed Vitals:   09/12/14 0926  BP: 137/80  Pulse: 121  Temp: 98.2 F (36.8 C)  Resp: 18   Filed Weights   09/12/14 0926  Weight: 166 lb 9.6 oz (75.569 kg)    GENERAL:alert, no distress and comfortable SKIN: skin color, texture, turgor are normal, no rashes or significant lesions EYES: normal, Conjunctiva are pink and non-injected, sclera clear OROPHARYNX:no exudate, no erythema and lips, buccal mucosa, and tongue normal  NECK: supple, thyroid normal size, non-tender, without nodularity LYMPH:  He has persistent lymphadenopathy at the base of his neck. None elsewhere.  LUNGS: clear to auscultation and percussion with normal breathing effort HEART: regular rate & rhythm and no murmurs and no lower extremity edema ABDOMEN:abdomen soft, non-tender and normal bowel sounds Musculoskeletal:no cyanosis of digits and no clubbing  NEURO: alert & oriented x 3 with fluent speech, no focal motor/sensory deficits  LABORATORY DATA:  I have reviewed the data as listed    Component Value Date/Time   NA 138 09/12/2014 0919   NA 138 06/02/2014 1350   K 4.9 09/12/2014 0919   K 4.4 06/02/2014 1350   CL 98 06/02/2014 1350   CO2 28 09/12/2014 0919   CO2 28 06/02/2014 1350   GLUCOSE 111 09/12/2014 0919   GLUCOSE 85 06/02/2014 1350   BUN 16.6 09/12/2014 0919   BUN 12 06/02/2014 1350   CREATININE 0.8 09/12/2014 0919   CREATININE 0.97 06/02/2014 1350   CALCIUM 9.5 09/12/2014  0919   CALCIUM 9.5 06/02/2014 1350   PROT 6.7 09/12/2014 0919   PROT 7.3 06/02/2014 1350   ALBUMIN 4.0 09/12/2014 0919   ALBUMIN 3.9 06/02/2014 1350   AST 15 09/12/2014 0919   AST 23 06/02/2014 1350   ALT 28 09/12/2014 0919   ALT 27 06/02/2014 1350   ALKPHOS 81 09/12/2014 0919   ALKPHOS 75 06/02/2014 1350   BILITOT 0.28 09/12/2014 0919   BILITOT 0.3 06/02/2014 1350   GFRNONAA 90* 06/02/2014 1350   GFRAA >90 06/02/2014 1350    No results found for: SPEP, UPEP  Lab Results  Component Value Date   WBC 9.2 09/12/2014   NEUTROABS 8.7* 09/12/2014   HGB 13.0 09/12/2014   HCT 39.5 09/12/2014   MCV 91.9 09/12/2014   PLT 291 09/12/2014      Chemistry      Component Value Date/Time   NA 138 09/12/2014 0919   NA 138 06/02/2014 1350   K 4.9 09/12/2014 0919   K 4.4 06/02/2014 1350   CL 98 06/02/2014 1350   CO2 28 09/12/2014 0919   CO2 28 06/02/2014 1350   BUN  16.6 09/12/2014 0919   BUN 12 06/02/2014 1350   CREATININE 0.8 09/12/2014 0919   CREATININE 0.97 06/02/2014 1350      Component Value Date/Time   CALCIUM 9.5 09/12/2014 0919   CALCIUM 9.5 06/02/2014 1350   ALKPHOS 81 09/12/2014 0919   ALKPHOS 75 06/02/2014 1350   AST 15 09/12/2014 0919   AST 23 06/02/2014 1350   ALT 28 09/12/2014 0919   ALT 27 06/02/2014 1350   BILITOT 0.28 09/12/2014 0919   BILITOT 0.3 06/02/2014 1350      ASSESSMENT & PLAN:  Follicular lymphoma grade 3a We discussed the risk, benefit, side effects of 6 cycles of chemotherapy versus 5 cycles of treatment. The patient wanted to return back to work and would like to omit the final cycle. I recommend repeat imaging study after his final cycle of treatment today to decide. He agreed to proceed.  Insomnia Per patient request, I gave him prescriptions xanax.  Nausea with vomiting This could be exacerbated by headache. He will continue using antiemetics as needed.  Spinal headache This is related to his last lumbar puncture procedure. I  recommend consideration for blood patch and he agreed to proceed. I refilled his prescription for headaches.   Orders Placed This Encounter  Procedures  . IR Epidurography    Standing Status: Future     Number of Occurrences:      Standing Expiration Date: 11/14/2015    Order Specific Question:  Reason for Exam (SYMPTOM  OR DIAGNOSIS REQUIRED)    Answer:  post LP headache need blood patch    Order Specific Question:  Preferred Imaging Location?    Answer:  [place] PET Image Restag (PS) Skull Base To Thigh    Standing Status: Future     Number of Occurrences:      Standing Expiration Date: 11/12/2015    Order Specific Question:  Reason for Exam (SYMPTOM  OR DIAGNOSIS REQUIRED)    Answer:  lymphoma staging    Order Specific Question:  Preferred imaging location?    Answer:  [facility]   All questions were answered. The patient knows to call the clinic with any problems, questions or concerns. No barriers to learning was detected. I spent 30 minutes counseling the patient face to face. The total time spent in the appointment was 40 minutes and more than 50% was on counseling and review of test results     [facility], [place], MD 09/14/2014 8:00 PM

## 2014-09-14 NOTE — Assessment & Plan Note (Signed)
Per patient request, I gave him prescriptions xanax.

## 2014-09-14 NOTE — Assessment & Plan Note (Signed)
This could be exacerbated by headache. He will continue using antiemetics as needed.

## 2014-09-14 NOTE — Assessment & Plan Note (Signed)
We discussed the risk, benefit, side effects of 6 cycles of chemotherapy versus 5 cycles of treatment. The patient wanted to return back to work and would like to omit the final cycle. I recommend repeat imaging study after his final cycle of treatment today to decide. He agreed to proceed.

## 2014-09-16 ENCOUNTER — Encounter: Payer: Self-pay | Admitting: Hematology and Oncology

## 2014-09-16 NOTE — Progress Notes (Signed)
Patient has asst with [id number] Rituxan  06/23/14-06/22/15 25,000 pat SH#[id number].

## 2014-09-16 NOTE — Progress Notes (Signed)
Put fmla form on nurse's desk °

## 2014-09-18 ENCOUNTER — Other Ambulatory Visit: Payer: BC Managed Care – PPO

## 2014-09-18 ENCOUNTER — Ambulatory Visit
Admission: RE | Admit: 2014-09-18 | Discharge: 2014-09-18 | Disposition: A | Discharge status: Home / Self Care | Payer: BC Managed Care – PPO | Source: Ambulatory Visit | Attending: Hematology and Oncology | Admitting: Hematology and Oncology

## 2014-09-18 DIAGNOSIS — G971 Other reaction to spinal and lumbar puncture: Secondary | ICD-10-CM

## 2014-09-18 MED ORDER — IOHEXOL 180 MG/ML  SOLN
1.0000 mL | Freq: Once | INTRAMUSCULAR | Status: AC | PRN
  Administered 2014-09-18: 1 mL via EPIDURAL

## 2014-09-18 NOTE — Progress Notes (Signed)
Blood drawn from left AC for blood patch.   20 cc's obtained, site is unremarkable and pt tolerated procedure well. 

## 2014-09-18 NOTE — Discharge Instructions (Signed)

## 2014-10-06 ENCOUNTER — Ambulatory Visit: Payer: BC Managed Care – PPO

## 2014-10-07 ENCOUNTER — Ambulatory Visit: Payer: BC Managed Care – PPO

## 2014-10-24 ENCOUNTER — Ambulatory Visit: Payer: BLUE CROSS/BLUE SHIELD

## 2014-10-24 ENCOUNTER — Ambulatory Visit (HOSPITAL_COMMUNITY)
Admission: RE | Admit: 2014-10-24 | Discharge: 2014-10-24 | Disposition: A | Discharge status: Home / Self Care | Payer: BLUE CROSS/BLUE SHIELD | Source: Ambulatory Visit | Attending: Hematology and Oncology | Admitting: Hematology and Oncology

## 2014-10-24 ENCOUNTER — Other Ambulatory Visit (HOSPITAL_BASED_OUTPATIENT_CLINIC_OR_DEPARTMENT_OTHER): Payer: BLUE CROSS/BLUE SHIELD

## 2014-10-24 DIAGNOSIS — C8241 Follicular lymphoma grade IIIb, lymph nodes of head, face, and neck: Secondary | ICD-10-CM

## 2014-10-24 DIAGNOSIS — Z95828 Presence of other vascular implants and grafts: Secondary | ICD-10-CM

## 2014-10-24 DIAGNOSIS — C8230 Follicular lymphoma grade IIIa, unspecified site: Secondary | ICD-10-CM | POA: Diagnosis present

## 2014-10-24 LAB — CBC WITH DIFFERENTIAL/PLATELET
BASO%: 0.5 % (ref 0.0–2.0)
Basophils Absolute: 0 10*3/uL (ref 0.0–0.1)
EOS%: 5.6 % (ref 0.0–7.0)
Eosinophils Absolute: 0.2 10*3/uL (ref 0.0–0.5)
HCT: 41.5 % (ref 38.4–49.9)
HGB: 14 g/dL (ref 13.0–17.1)
LYMPH%: 22.6 % (ref 14.0–49.0)
MCH: 30.8 pg (ref 27.2–33.4)
MCHC: 33.7 g/dL (ref 32.0–36.0)
MCV: 91.4 fL (ref 79.3–98.0)
MONO#: 0.6 10*3/uL (ref 0.1–0.9)
MONO%: 13.8 % (ref 0.0–14.0)
NEUT#: 2.4 10*3/uL (ref 1.5–6.5)
NEUT%: 57.5 % (ref 39.0–75.0)
Platelets: 268 10*3/uL (ref 140–400)
RBC: 4.54 10*6/uL (ref 4.20–5.82)
RDW: 13 % (ref 11.0–14.6)
WBC: 4.1 10*3/uL (ref 4.0–10.3)
lymph#: 0.9 10*3/uL (ref 0.9–3.3)

## 2014-10-24 LAB — COMPREHENSIVE METABOLIC PANEL (CC13)
ALT: 18 U/L (ref 0–55)
AST: 16 U/L (ref 5–34)
Albumin: 4.1 g/dL (ref 3.5–5.0)
Alkaline Phosphatase: 64 U/L (ref 40–150)
Anion Gap: 9 mEq/L (ref 3–11)
BUN: 9.4 mg/dL (ref 7.0–26.0)
CO2: 27 mEq/L (ref 22–29)
Calcium: 8.6 mg/dL (ref 8.4–10.4)
Chloride: 100 mEq/L (ref 98–109)
Creatinine: 0.8 mg/dL (ref 0.7–1.3)
EGFR: >90 mL/min/{1.73_m2} (ref >90)
Glucose: 87 mg/dl (ref 70–140)
Potassium: 4 mEq/L (ref 3.5–5.1)
Sodium: 136 mEq/L (ref 136–145)
Total Bilirubin: 0.34 mg/dL (ref 0.20–1.20)
Total Protein: 6.7 g/dL (ref 6.4–8.3)

## 2014-10-24 LAB — GLUCOSE, CAPILLARY: Glucose-Capillary: 88 mg/dL (ref 70–99)

## 2014-10-24 MED ORDER — FLUDEOXYGLUCOSE F - 18 (FDG) INJECTION
8.2800 | Freq: Once | INTRAVENOUS | Status: AC | PRN
  Administered 2014-10-24: 8.28 via INTRAVENOUS

## 2014-10-24 MED ORDER — HEPARIN SOD (PORK) LOCK FLUSH 100 UNIT/ML IV SOLN
500.0000 [IU] | Freq: Once | INTRAVENOUS | Status: AC
  Administered 2014-10-24: 500 [IU] via INTRAVENOUS
  Filled 2014-10-24: qty 5

## 2014-10-24 MED ORDER — SODIUM CHLORIDE 0.9 % IJ SOLN
10.0000 mL | INTRAMUSCULAR | Status: DC | PRN
  Administered 2014-10-24: 10 mL via INTRAVENOUS
  Filled 2014-10-24: qty 10

## 2014-10-24 NOTE — Patient Instructions (Signed)

## 2014-10-27 ENCOUNTER — Telehealth: Payer: Self-pay | Admitting: Hematology and Oncology

## 2014-10-27 ENCOUNTER — Ambulatory Visit (HOSPITAL_BASED_OUTPATIENT_CLINIC_OR_DEPARTMENT_OTHER): Payer: BLUE CROSS/BLUE SHIELD | Admitting: Hematology and Oncology

## 2014-10-27 VITALS — BP 165/96 | HR 99 | Temp 97.5°F | Resp 20 | Ht 70.0 in | Wt 163.0 lb

## 2014-10-27 DIAGNOSIS — G4489 Other headache syndrome: Secondary | ICD-10-CM

## 2014-10-27 DIAGNOSIS — R51 Headache: Secondary | ICD-10-CM

## 2014-10-27 DIAGNOSIS — C8230 Follicular lymphoma grade IIIa, unspecified site: Secondary | ICD-10-CM

## 2014-10-27 NOTE — Progress Notes (Signed)
[place] OFFICE PROGRESS NOTE  Patient Care Team: Christain Sacramento, MD as PCP - General (Family Medicine) Jackolyn Confer, MD as Referring Physician (General Surgery) Heath Lark, MD as Consulting Physician (Hematology and Oncology)  SUMMARY OF ONCOLOGIC HISTORY: Oncology History   Follicular lymphoma grade 3a   Primary site: Lymphoid Neoplasms   Staging method: AJCC 6th Edition   Clinical free text: Bone marrow is involved on PET; Possible component of DLBCL   Clinical: Stage IV signed by Heath Lark, MD on 06/18/2014  8:05 PM   Summary: Stage IV       Follicular lymphoma grade 3a   05/13/2014 Imaging CT scan of chest and neck showed extensive lymphadenopathy in the neck and medistinum   05/19/2014 Procedure [id number] US biopsy only showed necrotic tissue   06/03/2014 Surgery Excisional LN biopsy of right supraclavicular region showed follicular lymphoma grade 3 [id number]   06/14/2014 Imaging PET CT scan showed diffuse, bulky lymphadenopathy, splenic involvement and bone marrow disease   06/16/2014 Imaging ECHO showed preserved EF 50%   06/17/2014 Procedure He has port placement   06/20/2014 - 09/12/2014 Chemotherapy He received 5 cycles of chemotherapy with R. CHOP along with intrathecal methotrexate. He received 2 doses of IT MTX. He did not receive the final cycle of CHOP due to side effects   08/20/2014 Imaging Repeat PET/CT scan showed significant improvement with near-complete response to treatment.   10/24/2014 Imaging Repeat PET CT scan showed near complete resolution of lymphadenopathy    INTERVAL HISTORY: Please see below for problem oriented charting. He is here today to review results. His headache has almost completely resolved. He denies new lymphadenopathy  REVIEW OF SYSTEMS:   Constitutional: Denies fevers, chills or abnormal weight loss Eyes: Denies blurriness of vision Ears, nose, mouth, throat, and face: Denies mucositis or sore throat Respiratory:  Denies cough, dyspnea or wheezes Cardiovascular: Denies palpitation, chest discomfort or lower extremity swelling Gastrointestinal:  Denies nausea, heartburn or change in bowel habits Skin: Denies abnormal skin rashes Lymphatics: Denies new lymphadenopathy or easy bruising Neurological:Denies numbness, tingling or new weaknesses Behavioral/Psych: Mood is stable, no new changes  All other systems were reviewed with the patient and are negative.  I have reviewed the past medical history, past surgical history, social history and family history with the patient and they are unchanged from previous note.  ALLERGIES:  has No Known Allergies.  MEDICATIONS:  Current Outpatient Prescriptions  Medication Sig Dispense Refill  . aspirin-acetaminophen-caffeine (EXCEDRIN MIGRAINE) 250-250-65 MG per tablet Take 1 tablet by mouth every 6 (six) hours as needed.    . cholecalciferol (VITAMIN D) 1000 UNITS tablet Take 1,000 Units by mouth daily.    Marland Kitchen lidocaine-prilocaine (EMLA) cream Apply 1 application topically as needed. 30 g 6  . Turmeric 500 MG CAPS Take 500 mg by mouth daily.     No current facility-administered medications for this visit.    PHYSICAL EXAMINATION: ECOG PERFORMANCE STATUS: 0 - Asymptomatic  Filed Vitals:   10/27/14 1057  BP: 165/96  Pulse: 99  Temp: 97.5 F (36.4 C)  Resp: 20   Filed Weights   10/27/14 1057  Weight: 163 lb (73.936 kg)    GENERAL:alert, no distress and comfortable SKIN: skin color, texture, turgor are normal, no rashes or significant lesions EYES: normal, Conjunctiva are pink and non-injected, sclera clear Musculoskeletal:no cyanosis of digits and no clubbing  NEURO: alert & oriented x 3 with fluent speech, no focal motor/sensory deficits  LABORATORY DATA:  I  have reviewed the data as listed    Component Value Date/Time   NA 136 10/24/2014 1039   NA 138 06/02/2014 1350   K 4.0 10/24/2014 1039   K 4.4 06/02/2014 1350   CL 98 06/02/2014 1350    CO2 27 10/24/2014 1039   CO2 28 06/02/2014 1350   GLUCOSE 87 10/24/2014 1039   GLUCOSE 85 06/02/2014 1350   BUN 9.4 10/24/2014 1039   BUN 12 06/02/2014 1350   CREATININE 0.8 10/24/2014 1039   CREATININE 0.97 06/02/2014 1350   CALCIUM 8.6 10/24/2014 1039   CALCIUM 9.5 06/02/2014 1350   PROT 6.7 10/24/2014 1039   PROT 7.3 06/02/2014 1350   ALBUMIN 4.1 10/24/2014 1039   ALBUMIN 3.9 06/02/2014 1350   AST 16 10/24/2014 1039   AST 23 06/02/2014 1350   ALT 18 10/24/2014 1039   ALT 27 06/02/2014 1350   ALKPHOS 64 10/24/2014 1039   ALKPHOS 75 06/02/2014 1350   BILITOT 0.34 10/24/2014 1039   BILITOT 0.3 06/02/2014 1350   GFRNONAA 90* 06/02/2014 1350   GFRAA >90 06/02/2014 1350    No results found for: SPEP, UPEP  Lab Results  Component Value Date   WBC 4.1 10/24/2014   NEUTROABS 2.4 10/24/2014   HGB 14.0 10/24/2014   HCT 41.5 10/24/2014   MCV 91.4 10/24/2014   PLT 268 10/24/2014      Chemistry      Component Value Date/Time   NA 136 10/24/2014 1039   NA 138 06/02/2014 1350   K 4.0 10/24/2014 1039   K 4.4 06/02/2014 1350   CL 98 06/02/2014 1350   CO2 27 10/24/2014 1039   CO2 28 06/02/2014 1350   BUN 9.4 10/24/2014 1039   BUN 12 06/02/2014 1350   CREATININE 0.8 10/24/2014 1039   CREATININE 0.97 06/02/2014 1350      Component Value Date/Time   CALCIUM 8.6 10/24/2014 1039   CALCIUM 9.5 06/02/2014 1350   ALKPHOS 64 10/24/2014 1039   ALKPHOS 75 06/02/2014 1350   AST 16 10/24/2014 1039   AST 23 06/02/2014 1350   ALT 18 10/24/2014 1039   ALT 27 06/02/2014 1350   BILITOT 0.34 10/24/2014 1039   BILITOT 0.3 06/02/2014 1350       RADIOGRAPHIC STUDIES: I reviewed the PET CT scan with him and his wife I have personally reviewed the radiological images as listed and agreed with the findings in the report.  ASSESSMENT & PLAN:  Follicular lymphoma grade 3a Clinically, he has near complete response to treatment. We discussed about the role of maintenance rituximab every  other month and he agreed to proceed. We discussed the role of chemotherapy. The intent is for maintenance.  We discussed some of the risks, benefits, side-effects of Rituximab.  Some of the short term side-effects included, though not limited to, risk of fatigue, allergic reactions, admission to hospital for various reasons, and risks of death.   The patient is aware that the response rates discussed earlier is not guaranteed.    After a long discussion, patient made an informed decision to proceed with the prescribed plan of care.   Headache This is well controlled with Excedrin when necessary.    No orders of the defined types were placed in this encounter.   All questions were answered. The patient knows to call the clinic with any problems, questions or concerns. No barriers to learning was detected. I spent 25 minutes counseling the patient face to face. The total time spent in  the appointment was 30 minutes and more than 50% was on counseling and review of test results     [facility], [place], MD 10/27/2014 12:36 PM

## 2014-10-27 NOTE — Assessment & Plan Note (Signed)
Clinically, he has near complete response to treatment. We discussed about the role of maintenance rituximab every other month and he agreed to proceed. We discussed the role of chemotherapy. The intent is for maintenance.  We discussed some of the risks, benefits, side-effects of Rituximab.  Some of the short term side-effects included, though not limited to, risk of fatigue, allergic reactions, admission to hospital for various reasons, and risks of death.   The patient is aware that the response rates discussed earlier is not guaranteed.    After a long discussion, patient made an informed decision to proceed with the prescribed plan of care.

## 2014-10-27 NOTE — Telephone Encounter (Signed)
gv and printed appt sched and avs for pt for Jan adn April...sed added tx.

## 2014-10-27 NOTE — Assessment & Plan Note (Signed)
This is well controlled with Excedrin when necessary.

## 2014-10-29 ENCOUNTER — Encounter: Payer: Self-pay | Admitting: Pharmacist

## 2014-10-31 ENCOUNTER — Ambulatory Visit (HOSPITAL_BASED_OUTPATIENT_CLINIC_OR_DEPARTMENT_OTHER): Payer: BLUE CROSS/BLUE SHIELD

## 2014-10-31 DIAGNOSIS — C8230 Follicular lymphoma grade IIIa, unspecified site: Secondary | ICD-10-CM

## 2014-10-31 DIAGNOSIS — Z5112 Encounter for antineoplastic immunotherapy: Secondary | ICD-10-CM

## 2014-10-31 MED ORDER — HEPARIN SOD (PORK) LOCK FLUSH 100 UNIT/ML IV SOLN
500.0000 [IU] | Freq: Once | INTRAVENOUS | Status: AC | PRN
  Administered 2014-10-31: 500 [IU]
  Filled 2014-10-31: qty 5

## 2014-10-31 MED ORDER — SODIUM CHLORIDE 0.9 % IV SOLN
Freq: Once | INTRAVENOUS | Status: AC
  Administered 2014-10-31: 10:00:00 via INTRAVENOUS

## 2014-10-31 MED ORDER — SODIUM CHLORIDE 0.9 % IV SOLN
375.0000 mg/m2 | Freq: Once | INTRAVENOUS | Status: AC
  Administered 2014-10-31: 700 mg via INTRAVENOUS
  Filled 2014-10-31: qty 70

## 2014-10-31 MED ORDER — SODIUM CHLORIDE 0.9 % IJ SOLN
10.0000 mL | INTRAMUSCULAR | Status: DC | PRN
  Administered 2014-10-31: 10 mL
  Filled 2014-10-31: qty 10

## 2014-10-31 MED ORDER — ACETAMINOPHEN 325 MG PO TABS
650.0000 mg | ORAL_TABLET | Freq: Once | ORAL | Status: AC
  Administered 2014-10-31: 650 mg via ORAL

## 2014-10-31 MED ORDER — DIPHENHYDRAMINE HCL 25 MG PO CAPS
50.0000 mg | ORAL_CAPSULE | Freq: Once | ORAL | Status: AC
  Administered 2014-10-31: 50 mg via ORAL

## 2014-10-31 MED ORDER — DIPHENHYDRAMINE HCL 25 MG PO CAPS
ORAL_CAPSULE | ORAL | Status: AC
  Filled 2014-10-31: qty 2

## 2014-10-31 MED ORDER — ACETAMINOPHEN 325 MG PO TABS
ORAL_TABLET | ORAL | Status: AC
  Filled 2014-10-31: qty 2

## 2014-10-31 NOTE — Patient Instructions (Signed)
[facility] Discharge Instructions for Patients Receiving Chemotherapy  Today you received the following chemotherapy agents Rituxan  To help prevent nausea and vomiting after your treatment, we encourage you to take your nausea medication as directed.   If you develop nausea and vomiting that is not controlled by your nausea medication, call the clinic.   BELOW ARE SYMPTOMS THAT SHOULD BE REPORTED IMMEDIATELY:  *FEVER GREATER THAN 100.5 F  *CHILLS WITH OR WITHOUT FEVER  NAUSEA AND VOMITING THAT IS NOT CONTROLLED WITH YOUR NAUSEA MEDICATION  *UNUSUAL SHORTNESS OF BREATH  *UNUSUAL BRUISING OR BLEEDING  TENDERNESS IN MOUTH AND THROAT WITH OR WITHOUT PRESENCE OF ULCERS  *URINARY PROBLEMS  *BOWEL PROBLEMS  UNUSUAL RASH Items with * indicate a potential emergency and should be followed up as soon as possible.  Feel free to call the clinic you have any questions or concerns. The clinic phone number is [phone].    

## 2015-01-02 ENCOUNTER — Other Ambulatory Visit (HOSPITAL_BASED_OUTPATIENT_CLINIC_OR_DEPARTMENT_OTHER): Payer: BLUE CROSS/BLUE SHIELD

## 2015-01-02 ENCOUNTER — Telehealth: Payer: Self-pay | Admitting: Hematology and Oncology

## 2015-01-02 ENCOUNTER — Ambulatory Visit (HOSPITAL_BASED_OUTPATIENT_CLINIC_OR_DEPARTMENT_OTHER): Payer: BLUE CROSS/BLUE SHIELD

## 2015-01-02 ENCOUNTER — Ambulatory Visit (HOSPITAL_BASED_OUTPATIENT_CLINIC_OR_DEPARTMENT_OTHER): Payer: BLUE CROSS/BLUE SHIELD | Admitting: Hematology and Oncology

## 2015-01-02 ENCOUNTER — Telehealth: Payer: Self-pay | Admitting: *Deleted

## 2015-01-02 ENCOUNTER — Ambulatory Visit: Payer: BLUE CROSS/BLUE SHIELD

## 2015-01-02 ENCOUNTER — Encounter: Payer: Self-pay | Admitting: Hematology and Oncology

## 2015-01-02 VITALS — BP 156/86 | HR 101 | Temp 98.1°F | Resp 18 | Ht 70.0 in | Wt 164.9 lb

## 2015-01-02 DIAGNOSIS — Z5112 Encounter for antineoplastic immunotherapy: Secondary | ICD-10-CM | POA: Diagnosis not present

## 2015-01-02 DIAGNOSIS — Z95828 Presence of other vascular implants and grafts: Secondary | ICD-10-CM

## 2015-01-02 DIAGNOSIS — C8230 Follicular lymphoma grade IIIa, unspecified site: Secondary | ICD-10-CM

## 2015-01-02 DIAGNOSIS — Z299 Encounter for prophylactic measures, unspecified: Secondary | ICD-10-CM

## 2015-01-02 LAB — COMPREHENSIVE METABOLIC PANEL (CC13)
ALT: 19 U/L (ref 0–55)
AST: 17 U/L (ref 5–34)
Albumin: 3.8 g/dL (ref 3.5–5.0)
Alkaline Phosphatase: 53 U/L (ref 40–150)
Anion Gap: 9 mEq/L (ref 3–11)
BUN: 9.4 mg/dL (ref 7.0–26.0)
CO2: 27 mEq/L (ref 22–29)
Calcium: 8.8 mg/dL (ref 8.4–10.4)
Chloride: 103 mEq/L (ref 98–109)
Creatinine: 0.8 mg/dL (ref 0.7–1.3)
EGFR: >90 mL/min/{1.73_m2} (ref >90)
Glucose: 134 mg/dl (ref 70–140)
Potassium: 3.9 mEq/L (ref 3.5–5.1)
Sodium: 138 mEq/L (ref 136–145)
Total Bilirubin: 0.51 mg/dL (ref 0.20–1.20)
Total Protein: 6.2 g/dL — ABNORMAL LOW (ref 6.4–8.3)

## 2015-01-02 LAB — CBC WITH DIFFERENTIAL/PLATELET
BASO%: 0.3 % (ref 0.0–2.0)
Basophils Absolute: 0 10*3/uL (ref 0.0–0.1)
EOS%: 2.1 % (ref 0.0–7.0)
Eosinophils Absolute: 0.1 10*3/uL (ref 0.0–0.5)
HCT: 41.5 % (ref 38.4–49.9)
HGB: 14.1 g/dL (ref 13.0–17.1)
LYMPH%: 25.5 % (ref 14.0–49.0)
MCH: 30.8 pg (ref 27.2–33.4)
MCHC: 34 g/dL (ref 32.0–36.0)
MCV: 90.6 fL (ref 79.3–98.0)
MONO#: 0.4 10*3/uL (ref 0.1–0.9)
MONO%: 9.8 % (ref 0.0–14.0)
NEUT#: 2.4 10*3/uL (ref 1.5–6.5)
NEUT%: 62.3 % (ref 39.0–75.0)
Platelets: 216 10*3/uL (ref 140–400)
RBC: 4.58 10*6/uL (ref 4.20–5.82)
RDW: 13.2 % (ref 11.0–14.6)
WBC: 3.9 10*3/uL — ABNORMAL LOW (ref 4.0–10.3)
lymph#: 1 10*3/uL (ref 0.9–3.3)

## 2015-01-02 MED ORDER — ACETAMINOPHEN 325 MG PO TABS
ORAL_TABLET | ORAL | Status: AC
  Filled 2015-01-02: qty 2

## 2015-01-02 MED ORDER — ACETAMINOPHEN 325 MG PO TABS
650.0000 mg | ORAL_TABLET | Freq: Once | ORAL | Status: AC
  Administered 2015-01-02: 650 mg via ORAL

## 2015-01-02 MED ORDER — SODIUM CHLORIDE 0.9 % IJ SOLN
10.0000 mL | INTRAMUSCULAR | Status: DC | PRN
  Administered 2015-01-02: 10 mL
  Filled 2015-01-02: qty 10

## 2015-01-02 MED ORDER — HEPARIN SOD (PORK) LOCK FLUSH 100 UNIT/ML IV SOLN
500.0000 [IU] | Freq: Once | INTRAVENOUS | Status: AC | PRN
Start: 2015-01-02 — End: 2015-01-02
  Administered 2015-01-02: 500 [IU]
  Filled 2015-01-02: qty 5

## 2015-01-02 MED ORDER — DIPHENHYDRAMINE HCL 25 MG PO CAPS
ORAL_CAPSULE | ORAL | Status: AC
  Filled 2015-01-02: qty 2

## 2015-01-02 MED ORDER — RITUXIMAB CHEMO INJECTION 500 MG/50ML
375.0000 mg/m2 | Freq: Once | INTRAVENOUS | Status: AC
  Administered 2015-01-02: 700 mg via INTRAVENOUS
  Filled 2015-01-02: qty 70

## 2015-01-02 MED ORDER — SODIUM CHLORIDE 0.9 % IV SOLN
Freq: Once | INTRAVENOUS | Status: AC
  Administered 2015-01-02: 11:00:00 via INTRAVENOUS

## 2015-01-02 MED ORDER — DIPHENHYDRAMINE HCL 25 MG PO CAPS
50.0000 mg | ORAL_CAPSULE | Freq: Once | ORAL | Status: AC
  Administered 2015-01-02: 50 mg via ORAL

## 2015-01-02 MED ORDER — SODIUM CHLORIDE 0.9 % IJ SOLN
10.0000 mL | INTRAMUSCULAR | Status: DC | PRN
  Administered 2015-01-02: 10 mL via INTRAVENOUS
  Filled 2015-01-02: qty 10

## 2015-01-02 NOTE — Patient Instructions (Signed)
[facility] Discharge Instructions for Patients Receiving Chemotherapy  Today you received the following chemotherapy agents Rituxan  To help prevent nausea and vomiting after your treatment, we encourage you to take your nausea medication as directed.   If you develop nausea and vomiting that is not controlled by your nausea medication, call the clinic.   BELOW ARE SYMPTOMS THAT SHOULD BE REPORTED IMMEDIATELY:  *FEVER GREATER THAN 100.5 F  *CHILLS WITH OR WITHOUT FEVER  NAUSEA AND VOMITING THAT IS NOT CONTROLLED WITH YOUR NAUSEA MEDICATION  *UNUSUAL SHORTNESS OF BREATH  *UNUSUAL BRUISING OR BLEEDING  TENDERNESS IN MOUTH AND THROAT WITH OR WITHOUT PRESENCE OF ULCERS  *URINARY PROBLEMS  *BOWEL PROBLEMS  UNUSUAL RASH Items with * indicate a potential emergency and should be followed up as soon as possible.  Feel free to call the clinic you have any questions or concerns. The clinic phone number is [phone].    

## 2015-01-02 NOTE — Patient Instructions (Signed)

## 2015-01-02 NOTE — Telephone Encounter (Signed)
Per staff message and POF I have scheduled appts. Advised scheduler of appts. JMW  

## 2015-01-02 NOTE — Telephone Encounter (Signed)
Gave avs & calendar for June. Sent message to schedule treatment. °

## 2015-01-03 DIAGNOSIS — Z299 Encounter for prophylactic measures, unspecified: Secondary | ICD-10-CM | POA: Insufficient documentation

## 2015-01-03 NOTE — Assessment & Plan Note (Signed)
Clinically, he has near complete response to treatment. We discussed about the role of maintenance rituximab every other month and he agreed to proceed. He has persistent lymphadenopathy in his neck. I recommend recheck PET CT scan in 2 months.

## 2015-01-03 NOTE — Progress Notes (Signed)
[place] OFFICE PROGRESS NOTE  Patient Care Team: Christain Sacramento, MD as PCP - General (Family Medicine) Jackolyn Confer, MD as Referring Physician (General Surgery) Heath Lark, MD as Consulting Physician (Hematology and Oncology)  SUMMARY OF ONCOLOGIC HISTORY: Oncology History   Follicular lymphoma grade 3a   Primary site: Lymphoid Neoplasms   Staging method: AJCC 6th Edition   Clinical free text: Bone marrow is involved on PET; Possible component of DLBCL   Clinical: Stage IV signed by Heath Lark, MD on 06/18/2014  8:05 PM   Summary: Stage IV       Follicular lymphoma grade 3a   05/13/2014 Imaging CT scan of chest and neck showed extensive lymphadenopathy in the neck and medistinum   05/19/2014 Procedure [id number] US biopsy only showed necrotic tissue   06/03/2014 Surgery Excisional LN biopsy of right supraclavicular region showed follicular lymphoma grade 3 [id number]   06/14/2014 Imaging PET CT scan showed diffuse, bulky lymphadenopathy, splenic involvement and bone marrow disease   06/16/2014 Imaging ECHO showed preserved EF 50%   06/17/2014 Procedure He has port placement   06/20/2014 - 09/12/2014 Chemotherapy He received 5 cycles of chemotherapy with R. CHOP along with intrathecal methotrexate. He received 2 doses of IT MTX. He did not receive the final cycle of CHOP due to side effects   08/20/2014 Imaging Repeat PET/CT scan showed significant improvement with near-complete response to treatment.   10/24/2014 Imaging Repeat PET CT scan showed near complete resolution of lymphadenopathy    INTERVAL HISTORY: Please see below for problem oriented charting. He is seen today prior to Rituxan maintenance. He has persistent lymphadenopathy.  REVIEW OF SYSTEMS:   Constitutional: Denies fevers, chills or abnormal weight loss Eyes: Denies blurriness of vision Ears, nose, mouth, throat, and face: Denies mucositis or sore throat Respiratory: Denies cough, dyspnea or  wheezes Cardiovascular: Denies palpitation, chest discomfort or lower extremity swelling Gastrointestinal:  Denies nausea, heartburn or change in bowel habits Skin: Denies abnormal skin rashes Lymphatics: Denies new lymphadenopathy or easy bruising Neurological:Denies numbness, tingling or new weaknesses Behavioral/Psych: Mood is stable, no new changes  All other systems were reviewed with the patient and are negative.  I have reviewed the past medical history, past surgical history, social history and family history with the patient and they are unchanged from previous note.  ALLERGIES:  has No Known Allergies.  MEDICATIONS:  Current Outpatient Prescriptions  Medication Sig Dispense Refill  . cholecalciferol (VITAMIN D) 1000 UNITS tablet Take 1,000 Units by mouth daily.    Marland Kitchen ibuprofen (ADVIL,MOTRIN) 200 MG tablet Take 200 mg by mouth every 6 (six) hours as needed.    . lidocaine-prilocaine (EMLA) cream Apply 1 application topically as needed. 30 g 6  . Turmeric 500 MG CAPS Take 500 mg by mouth daily.     No current facility-administered medications for this visit.    PHYSICAL EXAMINATION: ECOG PERFORMANCE STATUS: 0 - Asymptomatic  Filed Vitals:   01/02/15 0900  BP: 156/86  Pulse: 101  Temp: 98.1 F (36.7 C)  Resp: 18   Filed Weights   01/02/15 0900  Weight: 164 lb 14.4 oz (74.798 kg)    GENERAL:alert, no distress and comfortable SKIN: skin color, texture, turgor are normal, no rashes or significant lesions EYES: normal, Conjunctiva are pink and non-injected, sclera clear OROPHARYNX:no exudate, no erythema and lips, buccal mucosa, and tongue normal  NECK: supple, thyroid normal size, non-tender, without nodularity LYMPH:  Persistent palpable lymphadenopathy in his neck LUNGS: clear to  auscultation and percussion with normal breathing effort HEART: regular rate & rhythm and no murmurs and no lower extremity edema ABDOMEN:abdomen soft, non-tender and normal bowel  sounds Musculoskeletal:no cyanosis of digits and no clubbing  NEURO: alert & oriented x 3 with fluent speech, no focal motor/sensory deficits  LABORATORY DATA:  I have reviewed the data as listed    Component Value Date/Time   NA 138 01/02/2015 0928   NA 138 06/02/2014 1350   K 3.9 01/02/2015 0928   K 4.4 06/02/2014 1350   CL 98 06/02/2014 1350   CO2 27 01/02/2015 0928   CO2 28 06/02/2014 1350   GLUCOSE 134 01/02/2015 0928   GLUCOSE 85 06/02/2014 1350   BUN 9.4 01/02/2015 0928   BUN 12 06/02/2014 1350   CREATININE 0.8 01/02/2015 0928   CREATININE 0.97 06/02/2014 1350   CALCIUM 8.8 01/02/2015 0928   CALCIUM 9.5 06/02/2014 1350   PROT 6.2* 01/02/2015 0928   PROT 7.3 06/02/2014 1350   ALBUMIN 3.8 01/02/2015 0928   ALBUMIN 3.9 06/02/2014 1350   AST 17 01/02/2015 0928   AST 23 06/02/2014 1350   ALT 19 01/02/2015 0928   ALT 27 06/02/2014 1350   ALKPHOS 53 01/02/2015 0928   ALKPHOS 75 06/02/2014 1350   BILITOT 0.51 01/02/2015 0928   BILITOT 0.3 06/02/2014 1350   GFRNONAA 90* 06/02/2014 1350   GFRAA >90 06/02/2014 1350    No results found for: SPEP, UPEP  Lab Results  Component Value Date   WBC 3.9* 01/02/2015   NEUTROABS 2.4 01/02/2015   HGB 14.1 01/02/2015   HCT 41.5 01/02/2015   MCV 90.6 01/02/2015   PLT 216 01/02/2015      Chemistry      Component Value Date/Time   NA 138 01/02/2015 0928   NA 138 06/02/2014 1350   K 3.9 01/02/2015 0928   K 4.4 06/02/2014 1350   CL 98 06/02/2014 1350   CO2 27 01/02/2015 0928   CO2 28 06/02/2014 1350   BUN 9.4 01/02/2015 0928   BUN 12 06/02/2014 1350   CREATININE 0.8 01/02/2015 0928   CREATININE 0.97 06/02/2014 1350      Component Value Date/Time   CALCIUM 8.8 01/02/2015 0928   CALCIUM 9.5 06/02/2014 1350   ALKPHOS 53 01/02/2015 0928   ALKPHOS 75 06/02/2014 1350   AST 17 01/02/2015 0928   AST 23 06/02/2014 1350   ALT 19 01/02/2015 0928   ALT 27 06/02/2014 1350   BILITOT 0.51 01/02/2015 0928   BILITOT 0.3  06/02/2014 1350     ASSESSMENT & PLAN:  Follicular lymphoma grade 3a Clinically, he has near complete response to treatment. We discussed about the role of maintenance rituximab every other month and he agreed to proceed. He has persistent lymphadenopathy in his neck. I recommend recheck PET CT scan in 2 months.   Preventive measure I recommend starting aspirin 81 mg daily & vitamin D supplement I will monitor his BP carefully He will need to resume screening colonoscopy    Orders Placed This Encounter  Procedures  . NM PET Image Restag (PS) Skull Base To Thigh    Standing Status: Future     Number of Occurrences:      Standing Expiration Date: 03/03/2016    Order Specific Question:  Reason for Exam (SYMPTOM  OR DIAGNOSIS REQUIRED)    Answer:  lymphoma staging, persistent palpable lymphadenopathy    Order Specific Question:  Preferred imaging location?    Answer:  [facility]  All questions were answered. The patient knows to call the clinic with any problems, questions or concerns. No barriers to learning was detected. I spent 25 minutes counseling the patient face to face. The total time spent in the appointment was 30 minutes and more than 50% was on counseling and review of test results     [facility], [place], MD 01/03/2015 3:47 PM

## 2015-01-03 NOTE — Assessment & Plan Note (Signed)
I recommend starting aspirin 81 mg daily & vitamin D supplement I will monitor his BP carefully He will need to resume screening colonoscopy

## 2015-03-03 ENCOUNTER — Encounter (HOSPITAL_COMMUNITY): Payer: Self-pay

## 2015-03-03 ENCOUNTER — Ambulatory Visit (HOSPITAL_COMMUNITY)
Admission: RE | Admit: 2015-03-03 | Discharge: 2015-03-03 | Disposition: A | Discharge status: Home / Self Care | Payer: BLUE CROSS/BLUE SHIELD | Source: Ambulatory Visit | Attending: Hematology and Oncology | Admitting: Hematology and Oncology

## 2015-03-03 ENCOUNTER — Other Ambulatory Visit (HOSPITAL_BASED_OUTPATIENT_CLINIC_OR_DEPARTMENT_OTHER): Payer: BLUE CROSS/BLUE SHIELD

## 2015-03-03 DIAGNOSIS — I700 Atherosclerosis of aorta: Secondary | ICD-10-CM | POA: Diagnosis not present

## 2015-03-03 DIAGNOSIS — C8230 Follicular lymphoma grade IIIa, unspecified site: Secondary | ICD-10-CM

## 2015-03-03 LAB — COMPREHENSIVE METABOLIC PANEL (CC13)
ALT: 17 U/L (ref 0–55)
AST: 19 U/L (ref 5–34)
Albumin: 3.9 g/dL (ref 3.5–5.0)
Alkaline Phosphatase: 48 U/L (ref 40–150)
Anion Gap: 7 mEq/L (ref 3–11)
BUN: 10.6 mg/dL (ref 7.0–26.0)
CO2: 28 mEq/L (ref 22–29)
Calcium: 9 mg/dL (ref 8.4–10.4)
Chloride: 105 mEq/L (ref 98–109)
Creatinine: 0.9 mg/dL (ref 0.7–1.3)
EGFR: >90 mL/min/{1.73_m2} (ref >90)
Glucose: 103 mg/dl (ref 70–140)
Potassium: 3.9 mEq/L (ref 3.5–5.1)
Sodium: 140 mEq/L (ref 136–145)
Total Bilirubin: 0.42 mg/dL (ref 0.20–1.20)
Total Protein: 6.6 g/dL (ref 6.4–8.3)

## 2015-03-03 LAB — CBC WITH DIFFERENTIAL/PLATELET
BASO%: 0.5 % (ref 0.0–2.0)
Basophils Absolute: 0 10*3/uL (ref 0.0–0.1)
EOS%: 2.3 % (ref 0.0–7.0)
Eosinophils Absolute: 0.1 10*3/uL (ref 0.0–0.5)
HCT: 43.8 % (ref 38.4–49.9)
HGB: 14.6 g/dL (ref 13.0–17.1)
LYMPH%: 25.8 % (ref 14.0–49.0)
MCH: 30.7 pg (ref 27.2–33.4)
MCHC: 33.3 g/dL (ref 32.0–36.0)
MCV: 92 fL (ref 79.3–98.0)
MONO#: 0.4 10*3/uL (ref 0.1–0.9)
MONO%: 9.6 % (ref 0.0–14.0)
NEUT#: 2.4 10*3/uL (ref 1.5–6.5)
NEUT%: 61.8 % (ref 39.0–75.0)
Platelets: 232 10*3/uL (ref 140–400)
RBC: 4.76 10*6/uL (ref 4.20–5.82)
RDW: 13.3 % (ref 11.0–14.6)
WBC: 3.8 10*3/uL — ABNORMAL LOW (ref 4.0–10.3)
lymph#: 1 10*3/uL (ref 0.9–3.3)

## 2015-03-03 LAB — GLUCOSE, CAPILLARY: Glucose-Capillary: 103 mg/dL — ABNORMAL HIGH (ref 65–99)

## 2015-03-03 MED ORDER — FLUDEOXYGLUCOSE F - 18 (FDG) INJECTION
8.2300 | Freq: Once | INTRAVENOUS | Status: AC | PRN
  Administered 2015-03-03: 8.23 via INTRAVENOUS

## 2015-03-04 ENCOUNTER — Encounter: Payer: Self-pay | Admitting: Hematology and Oncology

## 2015-03-04 ENCOUNTER — Telehealth: Payer: Self-pay | Admitting: Hematology and Oncology

## 2015-03-04 ENCOUNTER — Ambulatory Visit (HOSPITAL_BASED_OUTPATIENT_CLINIC_OR_DEPARTMENT_OTHER): Payer: BLUE CROSS/BLUE SHIELD

## 2015-03-04 ENCOUNTER — Telehealth: Payer: Self-pay | Admitting: *Deleted

## 2015-03-04 ENCOUNTER — Ambulatory Visit (HOSPITAL_BASED_OUTPATIENT_CLINIC_OR_DEPARTMENT_OTHER): Payer: BLUE CROSS/BLUE SHIELD | Admitting: Hematology and Oncology

## 2015-03-04 VITALS — BP 162/87 | HR 96 | Temp 98.1°F | Resp 20 | Ht 70.0 in | Wt 168.9 lb

## 2015-03-04 VITALS — BP 148/85 | HR 82 | Temp 97.8°F | Resp 18

## 2015-03-04 DIAGNOSIS — G8929 Other chronic pain: Secondary | ICD-10-CM

## 2015-03-04 DIAGNOSIS — R5383 Other fatigue: Secondary | ICD-10-CM | POA: Diagnosis not present

## 2015-03-04 DIAGNOSIS — M25562 Pain in left knee: Secondary | ICD-10-CM

## 2015-03-04 DIAGNOSIS — Z5112 Encounter for antineoplastic immunotherapy: Secondary | ICD-10-CM | POA: Diagnosis not present

## 2015-03-04 DIAGNOSIS — C8230 Follicular lymphoma grade IIIa, unspecified site: Secondary | ICD-10-CM

## 2015-03-04 DIAGNOSIS — C8231 Follicular lymphoma grade IIIa, lymph nodes of head, face, and neck: Secondary | ICD-10-CM | POA: Diagnosis not present

## 2015-03-04 DIAGNOSIS — D72819 Decreased white blood cell count, unspecified: Secondary | ICD-10-CM | POA: Diagnosis not present

## 2015-03-04 DIAGNOSIS — I10 Essential (primary) hypertension: Secondary | ICD-10-CM | POA: Diagnosis not present

## 2015-03-04 DIAGNOSIS — M25561 Pain in right knee: Secondary | ICD-10-CM

## 2015-03-04 HISTORY — DX: Other fatigue: R53.83

## 2015-03-04 LAB — TSH CHCC: TSH: 1.416 m(IU)/L (ref 0.320–4.118)

## 2015-03-04 MED ORDER — SODIUM CHLORIDE 0.9 % IJ SOLN
10.0000 mL | INTRAMUSCULAR | Status: DC | PRN
  Administered 2015-03-04: 10 mL
  Filled 2015-03-04: qty 10

## 2015-03-04 MED ORDER — DIPHENHYDRAMINE HCL 25 MG PO CAPS
50.0000 mg | ORAL_CAPSULE | Freq: Once | ORAL | Status: AC
  Administered 2015-03-04: 50 mg via ORAL

## 2015-03-04 MED ORDER — SODIUM CHLORIDE 0.9 % IV SOLN
Freq: Once | INTRAVENOUS | Status: AC
  Administered 2015-03-04: 10:00:00 via INTRAVENOUS

## 2015-03-04 MED ORDER — SODIUM CHLORIDE 0.9 % IV SOLN
375.0000 mg/m2 | Freq: Once | INTRAVENOUS | Status: AC
  Administered 2015-03-04: 700 mg via INTRAVENOUS
  Filled 2015-03-04: qty 70

## 2015-03-04 MED ORDER — ACETAMINOPHEN 325 MG PO TABS
650.0000 mg | ORAL_TABLET | Freq: Once | ORAL | Status: AC
  Administered 2015-03-04: 650 mg via ORAL

## 2015-03-04 MED ORDER — DIPHENHYDRAMINE HCL 25 MG PO CAPS
ORAL_CAPSULE | ORAL | Status: AC
  Filled 2015-03-04: qty 2

## 2015-03-04 MED ORDER — ACETAMINOPHEN 325 MG PO TABS
ORAL_TABLET | ORAL | Status: AC
  Filled 2015-03-04: qty 2

## 2015-03-04 MED ORDER — HEPARIN SOD (PORK) LOCK FLUSH 100 UNIT/ML IV SOLN
500.0000 [IU] | Freq: Once | INTRAVENOUS | Status: AC | PRN
  Administered 2015-03-04: 500 [IU]
  Filled 2015-03-04: qty 5

## 2015-03-04 NOTE — Assessment & Plan Note (Signed)
This is likely due to recent treatment. The patient denies recent history of fevers, cough, chills, diarrhea or dysuria. He is asymptomatic from the leukopenia. I will observe for now.  I will continue the chemotherapy at current dose without dosage adjustment.  If the leukopenia gets progressive worse in the future, I might have to delay his treatment or adjust the chemotherapy dose. 

## 2015-03-04 NOTE — Telephone Encounter (Signed)
-----   Message from Heath Lark, MD sent at 03/04/2015 11:14 AM EDT ----- Regarding: TSH normal PLs let him know ----- Message -----    From: [facility]    Sent: 03/04/2015  11:06 AM      To: Heath Lark, MD

## 2015-03-04 NOTE — Assessment & Plan Note (Signed)
He has chronic, bilateral knee pain from arthritis. I recommend he consult with orthopedic surgeon for long-term management. In the meantime, I recommend conservative treatment with Tylenol Extra Strength.

## 2015-03-04 NOTE — Telephone Encounter (Signed)
Left VM for pt informing him of TSH normal.

## 2015-03-04 NOTE — Assessment & Plan Note (Signed)
His blood tests is normal. It could be due to possible sleep apnea or uncontrolled hypertension. I will check TSH to exclude hypothyroidism.

## 2015-03-04 NOTE — Progress Notes (Signed)
[facility] OFFICE PROGRESS NOTE  Patient Care Team: Christain Sacramento, MD as PCP - General (Family Medicine) Jackolyn Confer, MD as Referring Physician (General Surgery) Heath Lark, MD as Consulting Physician (Hematology and Oncology)  SUMMARY OF ONCOLOGIC HISTORY: Oncology History   Follicular lymphoma grade 3a   Primary site: Lymphoid Neoplasms   Staging method: AJCC 6th Edition   Clinical free text: Bone marrow is involved on PET; Possible component of DLBCL   Clinical: Stage IV signed by Heath Lark, MD on 06/18/2014  8:05 PM   Summary: Stage IV       Follicular lymphoma grade 3a   05/13/2014 Imaging CT scan of chest and neck showed extensive lymphadenopathy in the neck and medistinum   05/19/2014 Procedure [id number] US biopsy only showed necrotic tissue   06/03/2014 Surgery Excisional LN biopsy of right supraclavicular region showed follicular lymphoma grade 3 [id number]   06/14/2014 Imaging PET CT scan showed diffuse, bulky lymphadenopathy, splenic involvement and bone marrow disease   06/16/2014 Imaging ECHO showed preserved EF 50%   06/17/2014 Procedure He has port placement   06/20/2014 - 09/12/2014 Chemotherapy He received 5 cycles of chemotherapy with R. CHOP along with intrathecal methotrexate. He received 2 doses of IT MTX. He did not receive the final cycle of CHOP due to side effects   08/20/2014 Imaging Repeat PET/CT scan showed significant improvement with near-complete response to treatment.   10/24/2014 Imaging Repeat PET CT scan showed near complete resolution of lymphadenopathy   03/03/2015 Imaging Repeat PET CT showed no evidence of active disease    INTERVAL HISTORY: Please see below for problem oriented charting. He is seen prior to his next treatment of rituximab. He complained of diffuse arthritis pain in his knees and hips. He denies new lymphadenopathy. Denies recent infection. He complained of chronic fatigue on exertion.  REVIEW OF SYSTEMS:    Constitutional: Denies fevers, chills or abnormal weight loss Eyes: Denies blurriness of vision Ears, nose, mouth, throat, and face: Denies mucositis or sore throat Respiratory: Denies cough, dyspnea or wheezes Cardiovascular: Denies palpitation, chest discomfort or lower extremity swelling Gastrointestinal:  Denies nausea, heartburn or change in bowel habits Skin: Denies abnormal skin rashes Lymphatics: Denies new lymphadenopathy or easy bruising Neurological:Denies numbness, tingling or new weaknesses Behavioral/Psych: Mood is stable, no new changes  All other systems were reviewed with the patient and are negative.  I have reviewed the past medical history, past surgical history, social history and family history with the patient and they are unchanged from previous note.  ALLERGIES:  has No Known Allergies.  MEDICATIONS:  Current Outpatient Prescriptions  Medication Sig Dispense Refill  . cholecalciferol (VITAMIN D) 1000 UNITS tablet Take 2,000 Units by mouth daily.     Marland Kitchen ibuprofen (ADVIL,MOTRIN) 200 MG tablet Take 200 mg by mouth every 6 (six) hours as needed.    . lidocaine-prilocaine (EMLA) cream Apply 1 application topically as needed. 30 g 6  . Melatonin 5 MG TABS Take 10 mg by mouth at bedtime as needed.    . ranitidine (ZANTAC) 75 MG tablet Take 150 mg by mouth daily as needed for heartburn.    . Turmeric 500 MG CAPS Take 500 mg by mouth daily.     No current facility-administered medications for this visit.    PHYSICAL EXAMINATION: ECOG PERFORMANCE STATUS: 1 - Symptomatic but completely ambulatory  Filed Vitals:   03/04/15 0842  BP: 162/87  Pulse: 96  Temp: 98.1 F (36.7 C)  Resp: 20  Filed Weights   03/04/15 0842  Weight: 168 lb 14.4 oz (76.613 kg)    GENERAL:alert, no distress and comfortable SKIN: skin color, texture, turgor are normal, no rashes or significant lesions EYES: normal, Conjunctiva are pink and non-injected, sclera clear OROPHARYNX:no  exudate, no erythema and lips, buccal mucosa, and tongue normal  NECK: supple, thyroid normal size, non-tender, without nodularity LYMPH:  no palpable lymphadenopathy in the cervical, axillary or inguinal LUNGS: clear to auscultation and percussion with normal breathing effort HEART: regular rate & rhythm and no murmurs and no lower extremity edema ABDOMEN:abdomen soft, non-tender and normal bowel sounds Musculoskeletal:no cyanosis of digits and no clubbing  NEURO: alert & oriented x 3 with fluent speech, no focal motor/sensory deficits  LABORATORY DATA:  I have reviewed the data as listed    Component Value Date/Time   NA 140 03/03/2015 0824   NA 138 06/02/2014 1350   K 3.9 03/03/2015 0824   K 4.4 06/02/2014 1350   CL 98 06/02/2014 1350   CO2 28 03/03/2015 0824   CO2 28 06/02/2014 1350   GLUCOSE 103 03/03/2015 0824   GLUCOSE 85 06/02/2014 1350   BUN 10.6 03/03/2015 0824   BUN 12 06/02/2014 1350   CREATININE 0.9 03/03/2015 0824   CREATININE 0.97 06/02/2014 1350   CALCIUM 9.0 03/03/2015 0824   CALCIUM 9.5 06/02/2014 1350   PROT 6.6 03/03/2015 0824   PROT 7.3 06/02/2014 1350   ALBUMIN 3.9 03/03/2015 0824   ALBUMIN 3.9 06/02/2014 1350   AST 19 03/03/2015 0824   AST 23 06/02/2014 1350   ALT 17 03/03/2015 0824   ALT 27 06/02/2014 1350   ALKPHOS 48 03/03/2015 0824   ALKPHOS 75 06/02/2014 1350   BILITOT 0.42 03/03/2015 0824   BILITOT 0.3 06/02/2014 1350   GFRNONAA 90* 06/02/2014 1350   GFRAA >90 06/02/2014 1350    No results found for: SPEP, UPEP  Lab Results  Component Value Date   WBC 3.8* 03/03/2015   NEUTROABS 2.4 03/03/2015   HGB 14.6 03/03/2015   HCT 43.8 03/03/2015   MCV 92.0 03/03/2015   PLT 232 03/03/2015      Chemistry      Component Value Date/Time   NA 140 03/03/2015 0824   NA 138 06/02/2014 1350   K 3.9 03/03/2015 0824   K 4.4 06/02/2014 1350   CL 98 06/02/2014 1350   CO2 28 03/03/2015 0824   CO2 28 06/02/2014 1350   BUN 10.6 03/03/2015 0824    BUN 12 06/02/2014 1350   CREATININE 0.9 03/03/2015 0824   CREATININE 0.97 06/02/2014 1350      Component Value Date/Time   CALCIUM 9.0 03/03/2015 0824   CALCIUM 9.5 06/02/2014 1350   ALKPHOS 48 03/03/2015 0824   ALKPHOS 75 06/02/2014 1350   AST 19 03/03/2015 0824   AST 23 06/02/2014 1350   ALT 17 03/03/2015 0824   ALT 27 06/02/2014 1350   BILITOT 0.42 03/03/2015 0824   BILITOT 0.3 06/02/2014 1350       RADIOGRAPHIC STUDIES: I have personally reviewed the radiological images as listed and agreed with the findings in the report. Nm Pet Image Restag (ps) Skull Base To Thigh  03/03/2015   CLINICAL DATA:  Subsequent treatment strategy for lymphoma.  EXAM: NUCLEAR MEDICINE PET SKULL BASE TO THIGH  TECHNIQUE: 8.23 mCi F-18 FDG was injected intravenously. Full-ring PET imaging was performed from the skull base to thigh after the radiotracer. CT data was obtained and used for attenuation correction and  anatomic localization.  FASTING BLOOD GLUCOSE:  Value: 103 mg/dl  COMPARISON:  08/20/2014  FINDINGS: NECK  No hypermetabolic lymph nodes in the neck.  CHEST  No hypermetabolic mediastinal or hilar nodes. No suspicious pulmonary nodules on the CT scan.  ABDOMEN/PELVIS  No abnormal hypermetabolic activity within the liver, pancreas, adrenal glands, or spleen. No hypermetabolic lymph nodes in the abdomen or pelvis. Aortic atherosclerosis.  SKELETON  The SUV max within the posterior column of left acetabulum is equal to 2.66. Previously 3.01. No additional areas of increased radiotracer uptake above the bone marrow activity.  IMPRESSION: 1. No evidence for hypermetabolic adenopathy or mass within the neck, chest, abdomen or pelvis. 2. Mild, low level FDG uptake associated with the posterior column of the left acetabulum is again noted. This is slightly improved from the previous exam.   Electronically Signed   By: Kerby Moors M.D.   On: 03/03/2015 11:46     ASSESSMENT & PLAN:  Follicular lymphoma grade  3a Clinically, he has complete response to treatment. We will continue on maintenance rituximab every other month and he agreed to proceed. I will not recheck another scan onto the end of the year. I have discussed with him to focus on preventive care measures from now on.     Chronic leukopenia This is likely due to recent treatment. The patient denies recent history of fevers, cough, chills, diarrhea or dysuria. He is asymptomatic from the leukopenia. I will observe for now.  I will continue the chemotherapy at current dose without dosage adjustment.  If the leukopenia gets progressive worse in the future, I might have to delay his treatment or adjust the chemotherapy dose.     Other fatigue His blood tests is normal. It could be due to possible sleep apnea or uncontrolled hypertension. I will check TSH to exclude hypothyroidism.   Essential hypertension His blood pressure is chronically elevated. We discussed lifestyle modification, dietary changes and exercise. I recommend he purchase a blood pressure monitoring device. If his blood pressure remain elevated, I recommend he contact PCP for chronic blood pressure management.   Bilateral chronic knee pain He has chronic, bilateral knee pain from arthritis. I recommend he consult with orthopedic surgeon for long-term management. In the meantime, I recommend conservative treatment with Tylenol Extra Strength.    Orders Placed This Encounter  Procedures  . TSH    Standing Status: Future     Number of Occurrences: 1     Standing Expiration Date: 04/07/2016  . Lactate dehydrogenase    Standing Status: Standing     Number of Occurrences: 9     Standing Expiration Date: 03/03/2016   All questions were answered. The patient knows to call the clinic with any problems, questions or concerns. No barriers to learning was detected. I spent 30 minutes counseling the patient face to face. The total time spent in the appointment was 40  minutes and more than 50% was on counseling and review of test results     [facility], Cheri Ayotte, MD 03/04/2015 9:30 AM

## 2015-03-04 NOTE — Assessment & Plan Note (Signed)
Clinically, he has complete response to treatment. We will continue on maintenance rituximab every other month and he agreed to proceed. I will not recheck another scan onto the end of the year. I have discussed with him to focus on preventive care measures from now on.

## 2015-03-04 NOTE — Assessment & Plan Note (Signed)
His blood pressure is chronically elevated. We discussed lifestyle modification, dietary changes and exercise. I recommend he purchase a blood pressure monitoring device. If his blood pressure remain elevated, I recommend he contact PCP for chronic blood pressure management.

## 2015-03-04 NOTE — Patient Instructions (Signed)
[facility] Discharge Instructions for Patients Receiving Chemotherapy  Today you received the following chemotherapy agent: Rituxan   To help prevent nausea and vomiting after your treatment, we encourage you to take your nausea medication as prescribed.    If you develop nausea and vomiting that is not controlled by your nausea medication, call the clinic.   BELOW ARE SYMPTOMS THAT SHOULD BE REPORTED IMMEDIATELY:  *FEVER GREATER THAN 100.5 F  *CHILLS WITH OR WITHOUT FEVER  NAUSEA AND VOMITING THAT IS NOT CONTROLLED WITH YOUR NAUSEA MEDICATION  *UNUSUAL SHORTNESS OF BREATH  *UNUSUAL BRUISING OR BLEEDING  TENDERNESS IN MOUTH AND THROAT WITH OR WITHOUT PRESENCE OF ULCERS  *URINARY PROBLEMS  *BOWEL PROBLEMS  UNUSUAL RASH Items with * indicate a potential emergency and should be followed up as soon as possible.  Feel free to call the clinic you have any questions or concerns. The clinic phone number is [phone].  Please show the [facility] at check-in to the Emergency Department and triage nurse.   Rituximab injection What is this medicine? RITUXIMAB (ri TUX i mab) is a monoclonal antibody. This medicine changes the way the body's immune system works. It is used commonly to treat non-Hodgkin's lymphoma and other conditions. In cancer cells, this drug targets a specific protein within cancer cells and stops the cancer cells from growing. It is also used to treat rhuematoid arthritis (RA). In RA, this medicine slow the inflammatory process and help reduce joint pain and swelling. This medicine is often used with other cancer or arthritis medications. This medicine may be used for other purposes; ask your health care provider or pharmacist if you have questions. COMMON BRAND NAME(S): Rituxan What should I tell my health care provider before I take this medicine? They need to know if you have any of these conditions: -blood disorders -heart  disease -history of hepatitis B -infection (especially a virus infection such as chickenpox, cold sores, or herpes) -irregular heartbeat -kidney disease -lung or breathing disease, like asthma -lupus -an unusual or allergic reaction to rituximab, mouse proteins, other medicines, foods, dyes, or preservatives -pregnant or trying to get pregnant -breast-feeding How should I use this medicine? This medicine is for infusion into a vein. It is administered in a hospital or clinic by a specially trained health care professional. A special MedGuide will be given to you by the pharmacist with each prescription and refill. Be sure to read this information carefully each time. Talk to your pediatrician regarding the use of this medicine in children. This medicine is not approved for use in children. Overdosage: If you think you have taken too much of this medicine contact a poison control center or emergency room at once. NOTE: This medicine is only for you. Do not share this medicine with others. What if I miss a dose? It is important not to miss a dose. Call your doctor or health care professional if you are unable to keep an appointment. What may interact with this medicine? -cisplatin -medicines for blood pressure -some other medicines for arthritis -vaccines This list may not describe all possible interactions. Give your health care provider a list of all the medicines, herbs, non-prescription drugs, or dietary supplements you use. Also tell them if you smoke, drink alcohol, or use illegal drugs. Some items may interact with your medicine. What should I watch for while using this medicine? Report any side effects that you notice during your treatment right away, such as changes in your breathing,  fever, chills, dizziness or lightheadedness. These effects are more common with the first dose. Visit your prescriber or health care professional for checks on your progress. You will need to have  regular blood work. Report any other side effects. The side effects of this medicine can continue after you finish your treatment. Continue your course of treatment even though you feel ill unless your doctor tells you to stop. Call your doctor or health care professional for advice if you get a fever, chills or sore throat, or other symptoms of a cold or flu. Do not treat yourself. This drug decreases your body's ability to fight infections. Try to avoid being around people who are sick. This medicine may increase your risk to bruise or bleed. Call your doctor or health care professional if you notice any unusual bleeding. Be careful brushing and flossing your teeth or using a toothpick because you may get an infection or bleed more easily. If you have any dental work done, tell your dentist you are receiving this medicine. Avoid taking products that contain aspirin, acetaminophen, ibuprofen, naproxen, or ketoprofen unless instructed by your doctor. These medicines may hide a fever. Do not become pregnant while taking this medicine. Women should inform their doctor if they wish to become pregnant or think they might be pregnant. There is a potential for serious side effects to an unborn child. Talk to your health care professional or pharmacist for more information. Do not breast-feed an infant while taking this medicine. What side effects may I notice from receiving this medicine? Side effects that you should report to your doctor or health care professional as soon as possible: -allergic reactions like skin rash, itching or hives, swelling of the face, lips, or tongue -low blood counts - this medicine may decrease the number of white blood cells, red blood cells and platelets. You may be at increased risk for infections and bleeding. -signs of infection - fever or chills, cough, sore throat, pain or difficulty passing urine -signs of decreased platelets or bleeding - bruising, pinpoint red spots on the  skin, black, tarry stools, blood in the urine -signs of decreased red blood cells - unusually weak or tired, fainting spells, lightheadedness -breathing problems -confused, not responsive -chest pain -fast, irregular heartbeat -feeling faint or lightheaded, falls -mouth sores -redness, blistering, peeling or loosening of the skin, including inside the mouth -stomach pain -swelling of the ankles, feet, or hands -trouble passing urine or change in the amount of urine Side effects that usually do not require medical attention (report to your doctor or other health care professional if they continue or are bothersome): -anxiety -headache -loss of appetite -muscle aches -nausea -night sweats This list may not describe all possible side effects. Call your doctor for medical advice about side effects. You may report side effects to FDA at 1-800-FDA-1088. Where should I keep my medicine? This drug is given in a hospital or clinic and will not be stored at home. NOTE: This sheet is a summary. It may not cover all possible information. If you have questions about this medicine, talk to your doctor, pharmacist, or health care provider.  2015, [employer]. (2008-05-19 14:04:59)

## 2015-03-04 NOTE — Telephone Encounter (Signed)
Gave and printed appt sched and avs for pt for Aug °

## 2015-05-04 ENCOUNTER — Ambulatory Visit (HOSPITAL_BASED_OUTPATIENT_CLINIC_OR_DEPARTMENT_OTHER): Payer: BLUE CROSS/BLUE SHIELD | Admitting: Hematology and Oncology

## 2015-05-04 ENCOUNTER — Ambulatory Visit: Payer: BLUE CROSS/BLUE SHIELD

## 2015-05-04 ENCOUNTER — Telehealth: Payer: Self-pay | Admitting: *Deleted

## 2015-05-04 ENCOUNTER — Ambulatory Visit (HOSPITAL_BASED_OUTPATIENT_CLINIC_OR_DEPARTMENT_OTHER): Payer: BLUE CROSS/BLUE SHIELD

## 2015-05-04 ENCOUNTER — Other Ambulatory Visit (HOSPITAL_BASED_OUTPATIENT_CLINIC_OR_DEPARTMENT_OTHER): Payer: BLUE CROSS/BLUE SHIELD

## 2015-05-04 ENCOUNTER — Telehealth: Payer: Self-pay | Admitting: Hematology and Oncology

## 2015-05-04 ENCOUNTER — Encounter: Payer: Self-pay | Admitting: Hematology and Oncology

## 2015-05-04 VITALS — BP 158/89 | HR 92 | Temp 98.1°F | Resp 18 | Ht 70.0 in | Wt 168.4 lb

## 2015-05-04 VITALS — BP 145/89 | HR 82 | Temp 97.4°F | Resp 17

## 2015-05-04 DIAGNOSIS — C8238 Follicular lymphoma grade IIIa, lymph nodes of multiple sites: Secondary | ICD-10-CM

## 2015-05-04 DIAGNOSIS — C8230 Follicular lymphoma grade IIIa, unspecified site: Secondary | ICD-10-CM

## 2015-05-04 DIAGNOSIS — Z5112 Encounter for antineoplastic immunotherapy: Secondary | ICD-10-CM

## 2015-05-04 DIAGNOSIS — Z95828 Presence of other vascular implants and grafts: Secondary | ICD-10-CM

## 2015-05-04 DIAGNOSIS — I10 Essential (primary) hypertension: Secondary | ICD-10-CM

## 2015-05-04 LAB — COMPREHENSIVE METABOLIC PANEL (CC13)
ALT: 20 U/L (ref 0–55)
AST: 21 U/L (ref 5–34)
Albumin: 3.9 g/dL (ref 3.5–5.0)
Alkaline Phosphatase: 50 U/L (ref 40–150)
Anion Gap: 8 mEq/L (ref 3–11)
BUN: 14 mg/dL (ref 7.0–26.0)
CO2: 27 mEq/L (ref 22–29)
Calcium: 9.3 mg/dL (ref 8.4–10.4)
Chloride: 104 mEq/L (ref 98–109)
Creatinine: 0.8 mg/dL (ref 0.7–1.3)
EGFR: >90 mL/min/{1.73_m2} (ref >90)
Glucose: 103 mg/dl (ref 70–140)
Potassium: 4 mEq/L (ref 3.5–5.1)
Sodium: 139 mEq/L (ref 136–145)
Total Bilirubin: 0.4 mg/dL (ref 0.20–1.20)
Total Protein: 6.7 g/dL (ref 6.4–8.3)

## 2015-05-04 LAB — CBC WITH DIFFERENTIAL/PLATELET
BASO%: 0.4 % (ref 0.0–2.0)
Basophils Absolute: 0 10*3/uL (ref 0.0–0.1)
EOS%: 1.7 % (ref 0.0–7.0)
Eosinophils Absolute: 0.1 10*3/uL (ref 0.0–0.5)
HCT: 38.3 % — ABNORMAL LOW (ref 38.4–49.9)
HGB: 12.9 g/dL — ABNORMAL LOW (ref 13.0–17.1)
LYMPH%: 20 % (ref 14.0–49.0)
MCH: 30.4 pg (ref 27.2–33.4)
MCHC: 33.8 g/dL (ref 32.0–36.0)
MCV: 90 fL (ref 79.3–98.0)
MONO#: 0.4 10*3/uL (ref 0.1–0.9)
MONO%: 9.5 % (ref 0.0–14.0)
NEUT#: 3 10*3/uL (ref 1.5–6.5)
NEUT%: 68.4 % (ref 39.0–75.0)
Platelets: 226 10*3/uL (ref 140–400)
RBC: 4.25 10*6/uL (ref 4.20–5.82)
RDW: 12.8 % (ref 11.0–14.6)
WBC: 4.4 10*3/uL (ref 4.0–10.3)
lymph#: 0.9 10*3/uL (ref 0.9–3.3)

## 2015-05-04 LAB — LACTATE DEHYDROGENASE (CC13): LDH: 184 U/L (ref 125–245)

## 2015-05-04 MED ORDER — DIPHENHYDRAMINE HCL 25 MG PO CAPS
ORAL_CAPSULE | ORAL | Status: AC
  Filled 2015-05-04: qty 2

## 2015-05-04 MED ORDER — ACETAMINOPHEN 325 MG PO TABS
650.0000 mg | ORAL_TABLET | Freq: Once | ORAL | Status: AC
  Administered 2015-05-04: 650 mg via ORAL

## 2015-05-04 MED ORDER — DIPHENHYDRAMINE HCL 25 MG PO CAPS
50.0000 mg | ORAL_CAPSULE | Freq: Once | ORAL | Status: AC
  Administered 2015-05-04: 50 mg via ORAL

## 2015-05-04 MED ORDER — SODIUM CHLORIDE 0.9 % IJ SOLN
10.0000 mL | INTRAMUSCULAR | Status: DC | PRN
  Administered 2015-05-04: 10 mL via INTRAVENOUS
  Filled 2015-05-04: qty 10

## 2015-05-04 MED ORDER — HEPARIN SOD (PORK) LOCK FLUSH 100 UNIT/ML IV SOLN
500.0000 [IU] | Freq: Once | INTRAVENOUS | Status: AC | PRN
  Administered 2015-05-04: 500 [IU]
  Filled 2015-05-04: qty 5

## 2015-05-04 MED ORDER — SODIUM CHLORIDE 0.9 % IV SOLN
375.0000 mg/m2 | Freq: Once | INTRAVENOUS | Status: AC
  Administered 2015-05-04: 700 mg via INTRAVENOUS
  Filled 2015-05-04: qty 70

## 2015-05-04 MED ORDER — SODIUM CHLORIDE 0.9 % IV SOLN
Freq: Once | INTRAVENOUS | Status: AC
  Administered 2015-05-04: 09:00:00 via INTRAVENOUS

## 2015-05-04 MED ORDER — SODIUM CHLORIDE 0.9 % IJ SOLN
10.0000 mL | INTRAMUSCULAR | Status: DC | PRN
  Administered 2015-05-04: 10 mL
  Filled 2015-05-04: qty 10

## 2015-05-04 MED ORDER — ACETAMINOPHEN 325 MG PO TABS
ORAL_TABLET | ORAL | Status: AC
  Filled 2015-05-04: qty 2

## 2015-05-04 NOTE — Patient Instructions (Signed)

## 2015-05-04 NOTE — Assessment & Plan Note (Signed)
He has mild elevated blood pressure. Recommend he schedule an office visit with his primary care doctor to address the question whether he should be started on blood pressure medications.

## 2015-05-04 NOTE — Assessment & Plan Note (Signed)
Clinically, he has complete response to treatment. We will continue on maintenance rituximab every other month and he agreed to proceed. I will not recheck another scan till the end of the year.

## 2015-05-04 NOTE — Telephone Encounter (Signed)
Per staff message and POF I have scheduled appts. Advised scheduler of appts. JMW  

## 2015-05-04 NOTE — Patient Instructions (Signed)
[facility] Discharge Instructions for Patients Receiving Chemotherapy  Today you received the following chemotherapy agents: Rituxan  To help prevent nausea and vomiting after your treatment, we encourage you to take your nausea medication as prescribed by your physician.   If you develop nausea and vomiting that is not controlled by your nausea medication, call the clinic.   BELOW ARE SYMPTOMS THAT SHOULD BE REPORTED IMMEDIATELY:  *FEVER GREATER THAN 100.5 F  *CHILLS WITH OR WITHOUT FEVER  NAUSEA AND VOMITING THAT IS NOT CONTROLLED WITH YOUR NAUSEA MEDICATION  *UNUSUAL SHORTNESS OF BREATH  *UNUSUAL BRUISING OR BLEEDING  TENDERNESS IN MOUTH AND THROAT WITH OR WITHOUT PRESENCE OF ULCERS  *URINARY PROBLEMS  *BOWEL PROBLEMS  UNUSUAL RASH Items with * indicate a potential emergency and should be followed up as soon as possible.  Feel free to call the clinic you have any questions or concerns. The clinic phone number is [phone].  Please show the CHEMO ALERT CARD at check-in to the Emergency Department and triage nurse.   

## 2015-05-04 NOTE — Telephone Encounter (Signed)
Pt confirmed labs/ov per 08/01 POF, gave pt avs and calendar... KJ, sent msg to add chemo °

## 2015-05-04 NOTE — Progress Notes (Signed)
[place] OFFICE PROGRESS NOTE  Patient Care Team: Christain Sacramento, MD as PCP - General (Family Medicine) Jackolyn Confer, MD as Referring Physician (General Surgery) Heath Lark, MD as Consulting Physician (Hematology and Oncology)  SUMMARY OF ONCOLOGIC HISTORY: Oncology History   Follicular lymphoma grade 3a   Primary site: Lymphoid Neoplasms   Staging method: AJCC 6th Edition   Clinical free text: Bone marrow is involved on PET; Possible component of DLBCL   Clinical: Stage IV signed by Heath Lark, MD on 06/18/2014  8:05 PM   Summary: Stage IV       Follicular lymphoma grade 3a   05/13/2014 Imaging CT scan of chest and neck showed extensive lymphadenopathy in the neck and medistinum   05/19/2014 Procedure [id number] US biopsy only showed necrotic tissue   06/03/2014 Surgery Excisional LN biopsy of right supraclavicular region showed follicular lymphoma grade 3 [id number]   06/14/2014 Imaging PET CT scan showed diffuse, bulky lymphadenopathy, splenic involvement and bone marrow disease   06/16/2014 Imaging ECHO showed preserved EF 50%   06/17/2014 Procedure He has port placement   06/20/2014 - 09/12/2014 Chemotherapy He received 5 cycles of chemotherapy with R. CHOP along with intrathecal methotrexate. He received 2 doses of IT MTX. He did not receive the final cycle of CHOP due to side effects   08/20/2014 Imaging Repeat PET/CT scan showed significant improvement with near-complete response to treatment.   10/24/2014 Imaging Repeat PET CT scan showed near complete resolution of lymphadenopathy   03/03/2015 Imaging Repeat PET CT showed no evidence of active disease    INTERVAL HISTORY: Please see below for problem oriented charting. He returns for further follow-up. He is doing well. Denies new lymphadenopathy. No  Recent infection  REVIEW OF SYSTEMS:   Constitutional: Denies fevers, chills or abnormal weight loss Eyes: Denies blurriness of vision Ears, nose, mouth,  throat, and face: Denies mucositis or sore throat Respiratory: Denies cough, dyspnea or wheezes Cardiovascular: Denies palpitation, chest discomfort or lower extremity swelling Gastrointestinal:  Denies nausea, heartburn or change in bowel habits Skin: Denies abnormal skin rashes Lymphatics: Denies new lymphadenopathy or easy bruising Neurological:Denies numbness, tingling or new weaknesses Behavioral/Psych: Mood is stable, no new changes  All other systems were reviewed with the patient and are negative.  I have reviewed the past medical history, past surgical history, social history and family history with the patient and they are unchanged from previous note.  ALLERGIES:  has No Known Allergies.  MEDICATIONS:  Current Outpatient Prescriptions  Medication Sig Dispense Refill  . cholecalciferol (VITAMIN D) 1000 UNITS tablet Take 2,000 Units by mouth daily.     Marland Kitchen ibuprofen (ADVIL,MOTRIN) 200 MG tablet Take 200 mg by mouth every 6 (six) hours as needed.    . lidocaine-prilocaine (EMLA) cream Apply 1 application topically as needed. 30 g 6  . Melatonin 5 MG TABS Take 10 mg by mouth at bedtime as needed.    . ranitidine (ZANTAC) 75 MG tablet Take 150 mg by mouth daily as needed for heartburn.     No current facility-administered medications for this visit.   Facility-Administered Medications Ordered in Other Visits  Medication Dose Route Frequency Provider Last Rate Last Dose  . heparin lock flush 100 unit/mL  500 Units Intracatheter Once PRN Heath Lark, MD      . riTUXimab (RITUXAN) 700 mg in sodium chloride 0.9 % 180 mL chemo infusion  375 mg/m2 (Treatment Plan Actual) Intravenous Once Heath Lark, MD      .  sodium chloride 0.9 % injection 10 mL  10 mL Intracatheter PRN Heath Lark, MD        PHYSICAL EXAMINATION: ECOG PERFORMANCE STATUS: 0 - Asymptomatic  Filed Vitals:   05/04/15 0827  BP: 158/89  Pulse: 92  Temp: 98.1 F (36.7 C)  Resp: 18   Filed Weights   05/04/15 0827   Weight: 168 lb 6.4 oz (76.386 kg)    GENERAL:alert, no distress and comfortable SKIN: skin color, texture, turgor are normal, no rashes or significant lesions EYES: normal, Conjunctiva are pink and non-injected, sclera clear OROPHARYNX:no exudate, no erythema and lips, buccal mucosa, and tongue normal  NECK: supple, thyroid normal size, non-tender, without nodularity LYMPH:  no palpable lymphadenopathy in the cervical, axillary or inguinal LUNGS: clear to auscultation and percussion with normal breathing effort HEART: regular rate & rhythm and no murmurs and no lower extremity edema ABDOMEN:abdomen soft, non-tender and normal bowel sounds Musculoskeletal:no cyanosis of digits and no clubbing  NEURO: alert & oriented x 3 with fluent speech, no focal motor/sensory deficits  LABORATORY DATA:  I have reviewed the data as listed    Component Value Date/Time   NA 139 05/04/2015 0809   NA 138 06/02/2014 1350   K 4.0 05/04/2015 0809   K 4.4 06/02/2014 1350   CL 98 06/02/2014 1350   CO2 27 05/04/2015 0809   CO2 28 06/02/2014 1350   GLUCOSE 103 05/04/2015 0809   GLUCOSE 85 06/02/2014 1350   BUN 14.0 05/04/2015 0809   BUN 12 06/02/2014 1350   CREATININE 0.8 05/04/2015 0809   CREATININE 0.97 06/02/2014 1350   CALCIUM 9.3 05/04/2015 0809   CALCIUM 9.5 06/02/2014 1350   PROT 6.7 05/04/2015 0809   PROT 7.3 06/02/2014 1350   ALBUMIN 3.9 05/04/2015 0809   ALBUMIN 3.9 06/02/2014 1350   AST 21 05/04/2015 0809   AST 23 06/02/2014 1350   ALT 20 05/04/2015 0809   ALT 27 06/02/2014 1350   ALKPHOS 50 05/04/2015 0809   ALKPHOS 75 06/02/2014 1350   BILITOT 0.40 05/04/2015 0809   BILITOT 0.3 06/02/2014 1350   GFRNONAA 90* 06/02/2014 1350   GFRAA >90 06/02/2014 1350    No results found for: SPEP, UPEP  Lab Results  Component Value Date   WBC 4.4 05/04/2015   NEUTROABS 3.0 05/04/2015   HGB 12.9* 05/04/2015   HCT 38.3* 05/04/2015   MCV 90.0 05/04/2015   PLT 226 05/04/2015       Chemistry      Component Value Date/Time   NA 139 05/04/2015 0809   NA 138 06/02/2014 1350   K 4.0 05/04/2015 0809   K 4.4 06/02/2014 1350   CL 98 06/02/2014 1350   CO2 27 05/04/2015 0809   CO2 28 06/02/2014 1350   BUN 14.0 05/04/2015 0809   BUN 12 06/02/2014 1350   CREATININE 0.8 05/04/2015 0809   CREATININE 0.97 06/02/2014 1350      Component Value Date/Time   CALCIUM 9.3 05/04/2015 0809   CALCIUM 9.5 06/02/2014 1350   ALKPHOS 50 05/04/2015 0809   ALKPHOS 75 06/02/2014 1350   AST 21 05/04/2015 0809   AST 23 06/02/2014 1350   ALT 20 05/04/2015 0809   ALT 27 06/02/2014 1350   BILITOT 0.40 05/04/2015 0809   BILITOT 0.3 06/02/2014 1350    ASSESSMENT & PLAN:  Follicular lymphoma grade 3a Clinically, he has complete response to treatment. We will continue on maintenance rituximab every other month and he agreed to proceed. I will  not recheck another scan till the end of the year.  Essential hypertension He has mild elevated blood pressure. Recommend he schedule an office visit with his primary care doctor to address the question whether he should be started on blood pressure medications.   Orders Placed This Encounter  Procedures  . Comprehensive metabolic panel    Standing Status: Future     Number of Occurrences:      Standing Expiration Date: 06/07/2016  . CBC with Differential/Platelet    Standing Status: Future     Number of Occurrences:      Standing Expiration Date: 06/07/2016   All questions were answered. The patient knows to call the clinic with any problems, questions or concerns. No barriers to learning was detected. I spent 15 minutes counseling the patient face to face. The total time spent in the appointment was 20 minutes and more than 50% was on counseling and review of test results     [facility], [place], MD 05/04/2015 9:13 AM

## 2015-05-11 ENCOUNTER — Encounter: Payer: Self-pay | Admitting: Hematology and Oncology

## 2015-05-11 NOTE — Progress Notes (Signed)
Returned pt's phone call. Left message on voicemail to return call to [phone](Shauna).

## 2015-05-13 ENCOUNTER — Telehealth: Payer: Self-pay | Admitting: *Deleted

## 2015-05-13 NOTE — Telephone Encounter (Signed)
Wife left VM states [employer] has date of Diagnosis incorrect and are denying assistance for [facility] placement based on this information.  I called Heather at [facility] [place] phone [phone].   I gave her the correct date of diagnosis/biopsy is 06/03/14 and PAC is 06/17/14 according to our records.Marland Kitchen  Nira Conn says she will need to contact wife to get EOB sent to them w/ correct dates.   I offered to send records from our office but she says needs EOBs from insurance company so she will need to ask pt/wife to submit.

## 2015-06-18 ENCOUNTER — Encounter: Payer: Self-pay | Admitting: Hematology and Oncology

## 2015-06-18 NOTE — Progress Notes (Signed)
Called pt on 06/17/15 and left message to return call. Pt returned call 06/18/15. I asked him if he would like to re-enroll in the [place] for Rituxan. He said yes and gave me permission to re-enroll him online.

## 2015-06-18 NOTE — Progress Notes (Signed)
Re-enrolled pt in [place] for Rituxan eff 06/18/15-06/16/16 for $25,000.

## 2015-07-02 ENCOUNTER — Encounter: Payer: Self-pay | Admitting: Hematology and Oncology

## 2015-07-02 ENCOUNTER — Ambulatory Visit: Payer: BLUE CROSS/BLUE SHIELD

## 2015-07-02 ENCOUNTER — Ambulatory Visit (HOSPITAL_BASED_OUTPATIENT_CLINIC_OR_DEPARTMENT_OTHER): Payer: BLUE CROSS/BLUE SHIELD

## 2015-07-02 ENCOUNTER — Other Ambulatory Visit (HOSPITAL_BASED_OUTPATIENT_CLINIC_OR_DEPARTMENT_OTHER): Payer: BLUE CROSS/BLUE SHIELD

## 2015-07-02 ENCOUNTER — Ambulatory Visit (HOSPITAL_BASED_OUTPATIENT_CLINIC_OR_DEPARTMENT_OTHER): Payer: BLUE CROSS/BLUE SHIELD | Admitting: Hematology and Oncology

## 2015-07-02 ENCOUNTER — Telehealth: Payer: Self-pay | Admitting: Hematology and Oncology

## 2015-07-02 VITALS — BP 151/84 | HR 86 | Temp 98.3°F | Resp 18 | Ht 70.0 in | Wt 168.4 lb

## 2015-07-02 VITALS — BP 155/82 | HR 75 | Temp 98.6°F | Resp 18

## 2015-07-02 VITALS — BP 148/87 | HR 80 | Temp 98.7°F | Resp 18

## 2015-07-02 DIAGNOSIS — D638 Anemia in other chronic diseases classified elsewhere: Secondary | ICD-10-CM

## 2015-07-02 DIAGNOSIS — D72819 Decreased white blood cell count, unspecified: Secondary | ICD-10-CM

## 2015-07-02 DIAGNOSIS — C8230 Follicular lymphoma grade IIIa, unspecified site: Secondary | ICD-10-CM

## 2015-07-02 DIAGNOSIS — Z299 Encounter for prophylactic measures, unspecified: Secondary | ICD-10-CM

## 2015-07-02 DIAGNOSIS — Z23 Encounter for immunization: Secondary | ICD-10-CM | POA: Diagnosis not present

## 2015-07-02 DIAGNOSIS — C8238 Follicular lymphoma grade IIIa, lymph nodes of multiple sites: Secondary | ICD-10-CM | POA: Diagnosis not present

## 2015-07-02 DIAGNOSIS — Z5112 Encounter for antineoplastic immunotherapy: Secondary | ICD-10-CM

## 2015-07-02 LAB — COMPREHENSIVE METABOLIC PANEL (CC13)
ALT: 19 U/L (ref 0–55)
AST: 19 U/L (ref 5–34)
Albumin: 3.9 g/dL (ref 3.5–5.0)
Alkaline Phosphatase: 49 U/L (ref 40–150)
Anion Gap: 6 mEq/L (ref 3–11)
BUN: 12.5 mg/dL (ref 7.0–26.0)
CO2: 27 mEq/L (ref 22–29)
Calcium: 8.9 mg/dL (ref 8.4–10.4)
Chloride: 106 mEq/L (ref 98–109)
Creatinine: 0.8 mg/dL (ref 0.7–1.3)
EGFR: >90 mL/min/{1.73_m2} (ref >90)
Glucose: 91 mg/dl (ref 70–140)
Potassium: 4.2 mEq/L (ref 3.5–5.1)
Sodium: 139 mEq/L (ref 136–145)
Total Bilirubin: 0.35 mg/dL (ref 0.20–1.20)
Total Protein: 6.4 g/dL (ref 6.4–8.3)

## 2015-07-02 LAB — CBC WITH DIFFERENTIAL/PLATELET
BASO%: 0.7 % (ref 0.0–2.0)
Basophils Absolute: 0 10*3/uL (ref 0.0–0.1)
EOS%: 2.2 % (ref 0.0–7.0)
Eosinophils Absolute: 0.1 10*3/uL (ref 0.0–0.5)
HCT: 34 % — ABNORMAL LOW (ref 38.4–49.9)
HGB: 10.9 g/dL — ABNORMAL LOW (ref 13.0–17.1)
LYMPH%: 20.2 % (ref 14.0–49.0)
MCH: 26.7 pg — ABNORMAL LOW (ref 27.2–33.4)
MCHC: 32.2 g/dL (ref 32.0–36.0)
MCV: 83 fL (ref 79.3–98.0)
MONO#: 0.4 10*3/uL (ref 0.1–0.9)
MONO%: 12.8 % (ref 0.0–14.0)
NEUT#: 2.1 10*3/uL (ref 1.5–6.5)
NEUT%: 64.1 % (ref 39.0–75.0)
Platelets: 309 10*3/uL (ref 140–400)
RBC: 4.09 10*6/uL — ABNORMAL LOW (ref 4.20–5.82)
RDW: 13.3 % (ref 11.0–14.6)
WBC: 3.2 10*3/uL — ABNORMAL LOW (ref 4.0–10.3)
lymph#: 0.6 10*3/uL — ABNORMAL LOW (ref 0.9–3.3)

## 2015-07-02 LAB — LACTATE DEHYDROGENASE (CC13): LDH: 191 U/L (ref 125–245)

## 2015-07-02 MED ORDER — SODIUM CHLORIDE 0.9 % IV SOLN
375.0000 mg/m2 | Freq: Once | INTRAVENOUS | Status: AC
  Administered 2015-07-02: 700 mg via INTRAVENOUS
  Filled 2015-07-02: qty 70

## 2015-07-02 MED ORDER — ACETAMINOPHEN 325 MG PO TABS
ORAL_TABLET | ORAL | Status: AC
  Filled 2015-07-02: qty 2

## 2015-07-02 MED ORDER — SODIUM CHLORIDE 0.9 % IV SOLN
Freq: Once | INTRAVENOUS | Status: AC
  Administered 2015-07-02: 10:00:00 via INTRAVENOUS

## 2015-07-02 MED ORDER — DIPHENHYDRAMINE HCL 25 MG PO CAPS
50.0000 mg | ORAL_CAPSULE | Freq: Once | ORAL | Status: AC
Start: 2015-07-02 — End: 2015-07-02
  Administered 2015-07-02: 50 mg via ORAL

## 2015-07-02 MED ORDER — ACETAMINOPHEN 325 MG PO TABS
650.0000 mg | ORAL_TABLET | Freq: Once | ORAL | Status: AC
  Administered 2015-07-02: 650 mg via ORAL

## 2015-07-02 MED ORDER — HEPARIN SOD (PORK) LOCK FLUSH 100 UNIT/ML IV SOLN
500.0000 [IU] | Freq: Once | INTRAVENOUS | Status: AC | PRN
  Administered 2015-07-02: 500 [IU]
  Filled 2015-07-02: qty 5

## 2015-07-02 MED ORDER — SODIUM CHLORIDE 0.9 % IJ SOLN
10.0000 mL | Freq: Once | INTRAMUSCULAR | Status: AC
  Administered 2015-07-02: 10 mL via INTRAVENOUS
  Filled 2015-07-02: qty 10

## 2015-07-02 MED ORDER — SODIUM CHLORIDE 0.9 % IJ SOLN
10.0000 mL | INTRAMUSCULAR | Status: DC | PRN
  Administered 2015-07-02: 10 mL
  Filled 2015-07-02: qty 10

## 2015-07-02 MED ORDER — INFLUENZA VAC SPLIT QUAD 0.5 ML IM SUSY
0.5000 mL | PREFILLED_SYRINGE | Freq: Once | INTRAMUSCULAR | Status: AC
  Administered 2015-07-02: 0.5 mL via INTRAMUSCULAR
  Filled 2015-07-02: qty 0.5

## 2015-07-02 MED ORDER — DIPHENHYDRAMINE HCL 25 MG PO CAPS
ORAL_CAPSULE | ORAL | Status: AC
  Filled 2015-07-02: qty 2

## 2015-07-02 NOTE — Patient Instructions (Signed)
[facility] Discharge Instructions for Patients Receiving Chemotherapy  Today you received the following chemotherapy agents Rituxan To help prevent nausea and vomiting after your treatment, we encourage you to take your nausea medication as prescribed.  If you develop nausea and vomiting that is not controlled by your nausea medication, call the clinic.   BELOW ARE SYMPTOMS THAT SHOULD BE REPORTED IMMEDIATELY:  *FEVER GREATER THAN 100.5 F  *CHILLS WITH OR WITHOUT FEVER  NAUSEA AND VOMITING THAT IS NOT CONTROLLED WITH YOUR NAUSEA MEDICATION  *UNUSUAL SHORTNESS OF BREATH  *UNUSUAL BRUISING OR BLEEDING  TENDERNESS IN MOUTH AND THROAT WITH OR WITHOUT PRESENCE OF ULCERS  *URINARY PROBLEMS  *BOWEL PROBLEMS  UNUSUAL RASH Items with * indicate a potential emergency and should be followed up as soon as possible.  Feel free to call the clinic you have any questions or concerns. The clinic phone number is [phone].  Please show the CHEMO ALERT CARD at check-in to the Emergency Department and triage nurse.   

## 2015-07-02 NOTE — Assessment & Plan Note (Addendum)
This is likely anemia of chronic disease, related to his recent illness. The patient denies recent history of bleeding such as epistaxis, hematuria or hematochezia. He is asymptomatic from the anemia. We will observe for now.

## 2015-07-02 NOTE — Patient Instructions (Signed)

## 2015-07-02 NOTE — Assessment & Plan Note (Signed)
Clinically, he has complete response to treatment. We will continue on maintenance rituximab every other month and he agreed to proceed. I will not recheck another scan till the end of the year. 

## 2015-07-02 NOTE — Assessment & Plan Note (Signed)
We discussed the importance of preventive care and reviewed the vaccination programs. He does not have any prior allergic reactions to influenza vaccination. He agrees to proceed with influenza vaccination today and we will administer it today at the clinic.  

## 2015-07-02 NOTE — Telephone Encounter (Signed)
Gave and printed appts ched and avs for pt for DEC  °

## 2015-07-02 NOTE — Assessment & Plan Note (Signed)
This is likely due to recent viral Infection.I will observe for now.  I will continue the chemotherapy at current dose without dosage adjustment.

## 2015-07-02 NOTE — Progress Notes (Signed)
[place] OFFICE PROGRESS NOTE  Patient Care Team: Christain Sacramento, MD as PCP - General (Family Medicine) Jackolyn Confer, MD as Referring Physician (General Surgery) Heath Lark, MD as Consulting Physician (Hematology and Oncology)  SUMMARY OF ONCOLOGIC HISTORY: Oncology History   Follicular lymphoma grade 3a   Primary site: Lymphoid Neoplasms   Staging method: AJCC 6th Edition   Clinical free text: Bone marrow is involved on PET; Possible component of DLBCL   Clinical: Stage IV signed by Heath Lark, MD on 06/18/2014  8:05 PM   Summary: Stage IV       Follicular lymphoma grade 3a   05/13/2014 Imaging CT scan of chest and neck showed extensive lymphadenopathy in the neck and medistinum   05/19/2014 Procedure [id number] US biopsy only showed necrotic tissue   06/03/2014 Surgery Excisional LN biopsy of right supraclavicular region showed follicular lymphoma grade 3 [id number]   06/14/2014 Imaging PET CT scan showed diffuse, bulky lymphadenopathy, splenic involvement and bone marrow disease   06/16/2014 Imaging ECHO showed preserved EF 50%   06/17/2014 Procedure He has port placement   06/20/2014 - 09/12/2014 Chemotherapy He received 5 cycles of chemotherapy with R. CHOP along with intrathecal methotrexate. He received 2 doses of IT MTX. He did not receive the final cycle of CHOP due to side effects   08/20/2014 Imaging Repeat PET/CT scan showed significant improvement with near-complete response to treatment.   10/24/2014 Imaging Repeat PET CT scan showed near complete resolution of lymphadenopathy   03/03/2015 Imaging Repeat PET CT showed no evidence of active disease    INTERVAL HISTORY: Please see below for problem oriented charting. He returns today prior to rituximab treatment. Recently, he had some sort of stomach ache and flulike symptoms. Several members of his family were ill recently. He denies fevers or chills. Complain of mild fatigue. No new  lymphadenopathy.  REVIEW OF SYSTEMS:   Constitutional: Denies fevers, chills or abnormal weight loss Eyes: Denies blurriness of vision Ears, nose, mouth, throat, and face: Denies mucositis or sore throat Respiratory: Denies cough, dyspnea or wheezes Cardiovascular: Denies palpitation, chest discomfort or lower extremity swelling Gastrointestinal:  Denies nausea, heartburn or change in bowel habits Skin: Denies abnormal skin rashes Lymphatics: Denies new lymphadenopathy or easy bruising Neurological:Denies numbness, tingling or new weaknesses Behavioral/Psych: Mood is stable, no new changes  All other systems were reviewed with the patient and are negative.  I have reviewed the past medical history, past surgical history, social history and family history with the patient and they are unchanged from previous note.  ALLERGIES:  has No Known Allergies.  MEDICATIONS:  Current Outpatient Prescriptions  Medication Sig Dispense Refill  . cholecalciferol (VITAMIN D) 1000 UNITS tablet Take 2,000 Units by mouth daily.     Marland Kitchen ibuprofen (ADVIL,MOTRIN) 200 MG tablet Take 200 mg by mouth every 6 (six) hours as needed.    . lidocaine-prilocaine (EMLA) cream Apply 1 application topically as needed. 30 g 6  . Melatonin 5 MG TABS Take 10 mg by mouth at bedtime as needed.    . ranitidine (ZANTAC) 75 MG tablet Take 150 mg by mouth daily as needed for heartburn.     No current facility-administered medications for this visit.    PHYSICAL EXAMINATION: ECOG PERFORMANCE STATUS: 1 - Symptomatic but completely ambulatory  Filed Vitals:   07/02/15 0858  BP: 151/84  Pulse: 86  Temp: 98.3 F (36.8 C)  Resp: 18   Filed Weights   07/02/15 0858  Weight: 168  lb 6.4 oz (76.386 kg)    GENERAL:alert, no distress and comfortable SKIN: skin color, texture, turgor are normal, no rashes or significant lesions EYES: normal, Conjunctiva are pink and non-injected, sclera clear OROPHARYNX:no exudate, no  erythema and lips, buccal mucosa, and tongue normal  NECK: supple, thyroid normal size, non-tender, without nodularity LYMPH:  no palpable lymphadenopathy in the cervical, axillary or inguinal LUNGS: clear to auscultation and percussion with normal breathing effort HEART: regular rate & rhythm and no murmurs and no lower extremity edema ABDOMEN:abdomen soft, non-tender and normal bowel sounds Musculoskeletal:no cyanosis of digits and no clubbing  NEURO: alert & oriented x 3 with fluent speech, no focal motor/sensory deficits  LABORATORY DATA:  I have reviewed the data as listed    Component Value Date/Time   NA 139 07/02/2015 0829   NA 138 06/02/2014 1350   K 4.2 07/02/2015 0829   K 4.4 06/02/2014 1350   CL 98 06/02/2014 1350   CO2 27 07/02/2015 0829   CO2 28 06/02/2014 1350   GLUCOSE 91 07/02/2015 0829   GLUCOSE 85 06/02/2014 1350   BUN 12.5 07/02/2015 0829   BUN 12 06/02/2014 1350   CREATININE 0.8 07/02/2015 0829   CREATININE 0.97 06/02/2014 1350   CALCIUM 8.9 07/02/2015 0829   CALCIUM 9.5 06/02/2014 1350   PROT 6.4 07/02/2015 0829   PROT 7.3 06/02/2014 1350   ALBUMIN 3.9 07/02/2015 0829   ALBUMIN 3.9 06/02/2014 1350   AST 19 07/02/2015 0829   AST 23 06/02/2014 1350   ALT 19 07/02/2015 0829   ALT 27 06/02/2014 1350   ALKPHOS 49 07/02/2015 0829   ALKPHOS 75 06/02/2014 1350   BILITOT 0.35 07/02/2015 0829   BILITOT 0.3 06/02/2014 1350   GFRNONAA 90* 06/02/2014 1350   GFRAA >90 06/02/2014 1350    No results found for: SPEP, UPEP  Lab Results  Component Value Date   WBC 3.2* 07/02/2015   NEUTROABS 2.1 07/02/2015   HGB 10.9* 07/02/2015   HCT 34.0* 07/02/2015   MCV 83.0 07/02/2015   PLT 309 07/02/2015      Chemistry      Component Value Date/Time   NA 139 07/02/2015 0829   NA 138 06/02/2014 1350   K 4.2 07/02/2015 0829   K 4.4 06/02/2014 1350   CL 98 06/02/2014 1350   CO2 27 07/02/2015 0829   CO2 28 06/02/2014 1350   BUN 12.5 07/02/2015 0829   BUN 12  06/02/2014 1350   CREATININE 0.8 07/02/2015 0829   CREATININE 0.97 06/02/2014 1350      Component Value Date/Time   CALCIUM 8.9 07/02/2015 0829   CALCIUM 9.5 06/02/2014 1350   ALKPHOS 49 07/02/2015 0829   ALKPHOS 75 06/02/2014 1350   AST 19 07/02/2015 0829   AST 23 06/02/2014 1350   ALT 19 07/02/2015 0829   ALT 27 06/02/2014 1350   BILITOT 0.35 07/02/2015 0829   BILITOT 0.3 06/02/2014 1350     ASSESSMENT & PLAN:  Follicular lymphoma grade 3a Clinically, he has complete response to treatment. We will continue on maintenance rituximab every other month and he agreed to proceed. I will not recheck another scan till the end of the year.   Chronic leukopenia This is likely due to recent viral Infection.I will observe for now.  I will continue the chemotherapy at current dose without dosage adjustment.    Anemia in chronic illness This is likely anemia of chronic disease, related to his recent illness. The patient denies recent history  of bleeding such as epistaxis, hematuria or hematochezia. He is asymptomatic from the anemia. We will observe for now.  Preventive measure We discussed the importance of preventive care and reviewed the vaccination programs. He does not have any prior allergic reactions to influenza vaccination. He agrees to proceed with influenza vaccination today and we will administer it today at the clinic.    Orders Placed This Encounter  Procedures  . Comprehensive metabolic panel    Standing Status: Standing     Number of Occurrences: 9     Standing Expiration Date: 07/01/2016  . CBC with Differential/Platelet    Standing Status: Standing     Number of Occurrences: 9     Standing Expiration Date: 07/01/2016   All questions were answered. The patient knows to call the clinic with any problems, questions or concerns. No barriers to learning was detected. I spent 25 minutes counseling the patient face to face. The total time spent in the appointment was 30  minutes and more than 50% was on counseling and review of test results     [facility], [place], MD 07/02/2015 3:45 PM

## 2015-07-27 ENCOUNTER — Telehealth: Payer: Self-pay | Admitting: *Deleted

## 2015-07-27 NOTE — Telephone Encounter (Signed)
Pt left VM asking if he will have his lab/WBC checked before his next treatment?  Called pt back and left VM informing he will have labs done again same day as next treatment on 12/2.  Does not need to have checked again before that appt.   Asked him to call back if any other questions or concerns.

## 2015-08-17 ENCOUNTER — Telehealth: Payer: Self-pay | Admitting: Hematology and Oncology

## 2015-08-17 NOTE — Telephone Encounter (Signed)
s.w pt and advised on 12.2 appt time changed to 10:30 arrival....ok and aware of new time

## 2015-09-04 ENCOUNTER — Ambulatory Visit (HOSPITAL_BASED_OUTPATIENT_CLINIC_OR_DEPARTMENT_OTHER): Payer: BLUE CROSS/BLUE SHIELD | Admitting: Hematology and Oncology

## 2015-09-04 ENCOUNTER — Other Ambulatory Visit: Payer: BLUE CROSS/BLUE SHIELD

## 2015-09-04 ENCOUNTER — Other Ambulatory Visit (HOSPITAL_BASED_OUTPATIENT_CLINIC_OR_DEPARTMENT_OTHER): Payer: BLUE CROSS/BLUE SHIELD

## 2015-09-04 ENCOUNTER — Telehealth: Payer: Self-pay | Admitting: Hematology and Oncology

## 2015-09-04 ENCOUNTER — Ambulatory Visit: Payer: BLUE CROSS/BLUE SHIELD | Admitting: Hematology and Oncology

## 2015-09-04 ENCOUNTER — Ambulatory Visit: Payer: BLUE CROSS/BLUE SHIELD

## 2015-09-04 ENCOUNTER — Ambulatory Visit (HOSPITAL_BASED_OUTPATIENT_CLINIC_OR_DEPARTMENT_OTHER): Payer: BLUE CROSS/BLUE SHIELD

## 2015-09-04 ENCOUNTER — Telehealth: Payer: Self-pay | Admitting: *Deleted

## 2015-09-04 VITALS — BP 147/87 | HR 98 | Temp 98.3°F | Resp 20 | Ht 70.0 in | Wt 168.6 lb

## 2015-09-04 VITALS — BP 153/91 | HR 84 | Temp 98.7°F | Resp 17

## 2015-09-04 DIAGNOSIS — C8238 Follicular lymphoma grade IIIa, lymph nodes of multiple sites: Secondary | ICD-10-CM | POA: Diagnosis not present

## 2015-09-04 DIAGNOSIS — D509 Iron deficiency anemia, unspecified: Secondary | ICD-10-CM

## 2015-09-04 DIAGNOSIS — Z452 Encounter for adjustment and management of vascular access device: Secondary | ICD-10-CM

## 2015-09-04 DIAGNOSIS — Z95828 Presence of other vascular implants and grafts: Secondary | ICD-10-CM

## 2015-09-04 DIAGNOSIS — K649 Unspecified hemorrhoids: Secondary | ICD-10-CM

## 2015-09-04 DIAGNOSIS — I10 Essential (primary) hypertension: Secondary | ICD-10-CM

## 2015-09-04 DIAGNOSIS — Z5112 Encounter for antineoplastic immunotherapy: Secondary | ICD-10-CM | POA: Diagnosis not present

## 2015-09-04 DIAGNOSIS — C8230 Follicular lymphoma grade IIIa, unspecified site: Secondary | ICD-10-CM

## 2015-09-04 LAB — COMPREHENSIVE METABOLIC PANEL
ALT: 13 U/L (ref 0–55)
AST: 17 U/L (ref 5–34)
Albumin: 3.8 g/dL (ref 3.5–5.0)
Alkaline Phosphatase: 47 U/L (ref 40–150)
Anion Gap: 8 mEq/L (ref 3–11)
BUN: 12.5 mg/dL (ref 7.0–26.0)
CO2: 27 mEq/L (ref 22–29)
Calcium: 8.9 mg/dL (ref 8.4–10.4)
Chloride: 104 mEq/L (ref 98–109)
Creatinine: 1 mg/dL (ref 0.7–1.3)
EGFR: 79 mL/min/{1.73_m2} — ABNORMAL LOW (ref >90)
Glucose: 122 mg/dl (ref 70–140)
Potassium: 4.2 mEq/L (ref 3.5–5.1)
Sodium: 140 mEq/L (ref 136–145)
Total Bilirubin: 0.34 mg/dL (ref 0.20–1.20)
Total Protein: 6.5 g/dL (ref 6.4–8.3)

## 2015-09-04 LAB — CBC WITH DIFFERENTIAL/PLATELET
BASO%: 0.7 % (ref 0.0–2.0)
Basophils Absolute: 0 10*3/uL (ref 0.0–0.1)
EOS%: 2 % (ref 0.0–7.0)
Eosinophils Absolute: 0.1 10*3/uL (ref 0.0–0.5)
HCT: 35 % — ABNORMAL LOW (ref 38.4–49.9)
HGB: 10.9 g/dL — ABNORMAL LOW (ref 13.0–17.1)
LYMPH%: 20.8 % (ref 14.0–49.0)
MCH: 23.9 pg — ABNORMAL LOW (ref 27.2–33.4)
MCHC: 31 g/dL — ABNORMAL LOW (ref 32.0–36.0)
MCV: 77.1 fL — ABNORMAL LOW (ref 79.3–98.0)
MONO#: 0.4 10*3/uL (ref 0.1–0.9)
MONO%: 11 % (ref 0.0–14.0)
NEUT#: 2.3 10*3/uL (ref 1.5–6.5)
NEUT%: 65.5 % (ref 39.0–75.0)
Platelets: 294 10*3/uL (ref 140–400)
RBC: 4.54 10*6/uL (ref 4.20–5.82)
RDW: 16.6 % — ABNORMAL HIGH (ref 11.0–14.6)
WBC: 3.5 10*3/uL — ABNORMAL LOW (ref 4.0–10.3)
lymph#: 0.7 10*3/uL — ABNORMAL LOW (ref 0.9–3.3)

## 2015-09-04 LAB — LACTATE DEHYDROGENASE: LDH: 181 U/L (ref 125–245)

## 2015-09-04 MED ORDER — SODIUM CHLORIDE 0.9 % IJ SOLN
10.0000 mL | INTRAMUSCULAR | Status: DC | PRN
  Filled 2015-09-04: qty 10

## 2015-09-04 MED ORDER — DIPHENHYDRAMINE HCL 25 MG PO CAPS
ORAL_CAPSULE | ORAL | Status: AC
  Filled 2015-09-04: qty 2

## 2015-09-04 MED ORDER — LISINOPRIL 10 MG PO TABS: 10.0000 mg | ORAL_TABLET | Freq: Every day | ORAL | Status: DC

## 2015-09-04 MED ORDER — ACETAMINOPHEN 325 MG PO TABS
650.0000 mg | ORAL_TABLET | Freq: Once | ORAL | Status: AC
  Administered 2015-09-04: 650 mg via ORAL

## 2015-09-04 MED ORDER — SODIUM CHLORIDE 0.9 % IJ SOLN
10.0000 mL | INTRAMUSCULAR | Status: DC | PRN
  Administered 2015-09-04: 10 mL via INTRAVENOUS
  Filled 2015-09-04: qty 10

## 2015-09-04 MED ORDER — SODIUM CHLORIDE 0.9 % IV SOLN
Freq: Once | INTRAVENOUS | Status: AC
  Administered 2015-09-04: 14:00:00 via INTRAVENOUS

## 2015-09-04 MED ORDER — ACETAMINOPHEN 325 MG PO TABS
ORAL_TABLET | ORAL | Status: AC
Start: 2015-09-04 — End: 2015-09-04
  Filled 2015-09-04: qty 2

## 2015-09-04 MED ORDER — HEPARIN SOD (PORK) LOCK FLUSH 100 UNIT/ML IV SOLN
500.0000 [IU] | Freq: Once | INTRAVENOUS | Status: AC | PRN
  Administered 2015-09-04: 500 [IU]
  Filled 2015-09-04: qty 5

## 2015-09-04 MED ORDER — DIPHENHYDRAMINE HCL 25 MG PO CAPS
50.0000 mg | ORAL_CAPSULE | Freq: Once | ORAL | Status: AC
  Administered 2015-09-04: 50 mg via ORAL

## 2015-09-04 MED ORDER — ALTEPLASE 2 MG IJ SOLR
2.0000 mg | Freq: Once | INTRAMUSCULAR | Status: DC | PRN
  Filled 2015-09-04: qty 2

## 2015-09-04 MED ORDER — SODIUM CHLORIDE 0.9 % IV SOLN
375.0000 mg/m2 | Freq: Once | INTRAVENOUS | Status: AC
  Administered 2015-09-04: 700 mg via INTRAVENOUS
  Filled 2015-09-04: qty 70

## 2015-09-04 MED ORDER — ALTEPLASE 2 MG IJ SOLR
2.0000 mg | Freq: Once | INTRAMUSCULAR | Status: AC | PRN
  Administered 2015-09-04: 2 mg
  Filled 2015-09-04: qty 2

## 2015-09-04 NOTE — Assessment & Plan Note (Signed)
His blood pressure is chronically elevated. The patient has risk factor for heart disease. I will proceed to start him on lisinopril and plan to adjust the dose of medication

## 2015-09-04 NOTE — Progress Notes (Signed)
[place] OFFICE PROGRESS NOTE  Patient Care Team: Christain Sacramento, MD as PCP - General (Family Medicine) Jackolyn Confer, MD as Referring Physician (General Surgery) Heath Lark, MD as Consulting Physician (Hematology and Oncology)  SUMMARY OF ONCOLOGIC HISTORY: Oncology History   Follicular lymphoma grade 3a   Primary site: Lymphoid Neoplasms   Staging method: AJCC 6th Edition   Clinical free text: Bone marrow is involved on PET; Possible component of DLBCL   Clinical: Stage IV signed by Heath Lark, MD on 06/18/2014  8:05 PM   Summary: Stage IV       Follicular lymphoma grade iiia, lymph nodes of multiple sites ([place])   05/13/2014 Imaging CT scan of chest and neck showed extensive lymphadenopathy in the neck and medistinum   05/19/2014 Procedure [id number] US biopsy only showed necrotic tissue   06/03/2014 Surgery Excisional LN biopsy of right supraclavicular region showed follicular lymphoma grade 3 [id number]   06/14/2014 Imaging PET CT scan showed diffuse, bulky lymphadenopathy, splenic involvement and bone marrow disease   06/16/2014 Imaging ECHO showed preserved EF 50%   06/17/2014 Procedure He has port placement   06/20/2014 - 09/12/2014 Chemotherapy He received 5 cycles of chemotherapy with R. CHOP along with intrathecal methotrexate. He received 2 doses of IT MTX. He did not receive the final cycle of CHOP due to side effects   08/20/2014 Imaging Repeat PET/CT scan showed significant improvement with near-complete response to treatment.   10/24/2014 Imaging Repeat PET CT scan showed near complete resolution of lymphadenopathy   03/03/2015 Imaging Repeat PET CT showed no evidence of active disease    INTERVAL HISTORY: Please see below for problem oriented charting. He is seen prior to cycle 6 of treatment. He denies new lymphadenopathy. No recent infection. The patient has recent hemorrhoidal bleeding and has been bleeding fairly consistently after each bowel movement  at least 3 times a week.  REVIEW OF SYSTEMS:   Constitutional: Denies fevers, chills or abnormal weight loss Eyes: Denies blurriness of vision Ears, nose, mouth, throat, and face: Denies mucositis or sore throat Respiratory: Denies cough, dyspnea or wheezes Cardiovascular: Denies palpitation, chest discomfort or lower extremity swelling Gastrointestinal:  Denies nausea, heartburn or change in bowel habits Skin: Denies abnormal skin rashes Lymphatics: Denies new lymphadenopathy or easy bruising Neurological:Denies numbness, tingling or new weaknesses Behavioral/Psych: Mood is stable, no new changes  All other systems were reviewed with the patient and are negative.  I have reviewed the past medical history, past surgical history, social history and family history with the patient and they are unchanged from previous note.  ALLERGIES:  has No Known Allergies.  MEDICATIONS:  Current Outpatient Prescriptions  Medication Sig Dispense Refill  . cholecalciferol (VITAMIN D) 1000 UNITS tablet Take 2,000 Units by mouth daily.     Marland Kitchen ibuprofen (ADVIL,MOTRIN) 200 MG tablet Take 200 mg by mouth every 6 (six) hours as needed.    . lidocaine-prilocaine (EMLA) cream Apply 1 application topically as needed. 30 g 6  . Melatonin 5 MG TABS Take 10 mg by mouth at bedtime as needed.    . ranitidine (ZANTAC) 75 MG tablet Take 150 mg by mouth daily as needed for heartburn.    Marland Kitchen lisinopril (PRINIVIL,ZESTRIL) 10 MG tablet Take 1 tablet (10 mg total) by mouth daily. 90 tablet 1   No current facility-administered medications for this visit.   Facility-Administered Medications Ordered in Other Visits  Medication Dose Route Frequency Provider Last Rate Last Dose  . sodium chloride  0.9 % injection 10 mL  10 mL Intravenous PRN Heath Lark, MD   10 mL at 09/04/15 1027    PHYSICAL EXAMINATION: ECOG PERFORMANCE STATUS: 0 - Asymptomatic  Filed Vitals:   09/04/15 1047 09/04/15 1048  BP: 171/82 147/87  Pulse: 98    Temp: 98.3 F (36.8 C)   Resp: 20    Filed Weights   09/04/15 1047  Weight: 168 lb 9.6 oz (76.476 kg)    GENERAL:alert, no distress and comfortable SKIN: skin color, texture, turgor are normal, no rashes or significant lesions EYES: normal, Conjunctiva are pink and non-injected, sclera clear OROPHARYNX:no exudate, no erythema and lips, buccal mucosa, and tongue normal  NECK: supple, thyroid normal size, non-tender, without nodularity LYMPH:  no palpable lymphadenopathy in the cervical, axillary or inguinal LUNGS: clear to auscultation and percussion with normal breathing effort HEART: regular rate & rhythm and no murmurs and no lower extremity edema ABDOMEN:abdomen soft, non-tender and normal bowel sounds Musculoskeletal:no cyanosis of digits and no clubbing  NEURO: alert & oriented x 3 with fluent speech, no focal motor/sensory deficits  LABORATORY DATA:  I have reviewed the data as listed    Component Value Date/Time   NA 140 09/04/2015 1024   NA 138 06/02/2014 1350   K 4.2 09/04/2015 1024   K 4.4 06/02/2014 1350   CL 98 06/02/2014 1350   CO2 27 09/04/2015 1024   CO2 28 06/02/2014 1350   GLUCOSE 122 09/04/2015 1024   GLUCOSE 85 06/02/2014 1350   BUN 12.5 09/04/2015 1024   BUN 12 06/02/2014 1350   CREATININE 1.0 09/04/2015 1024   CREATININE 0.97 06/02/2014 1350   CALCIUM 8.9 09/04/2015 1024   CALCIUM 9.5 06/02/2014 1350   PROT 6.5 09/04/2015 1024   PROT 7.3 06/02/2014 1350   ALBUMIN 3.8 09/04/2015 1024   ALBUMIN 3.9 06/02/2014 1350   AST 17 09/04/2015 1024   AST 23 06/02/2014 1350   ALT 13 09/04/2015 1024   ALT 27 06/02/2014 1350   ALKPHOS 47 09/04/2015 1024   ALKPHOS 75 06/02/2014 1350   BILITOT 0.34 09/04/2015 1024   BILITOT 0.3 06/02/2014 1350   GFRNONAA 90* 06/02/2014 1350   GFRAA >90 06/02/2014 1350    No results found for: SPEP, UPEP  Lab Results  Component Value Date   WBC 3.5* 09/04/2015   NEUTROABS 2.3 09/04/2015   HGB 10.9* 09/04/2015   HCT  35.0* 09/04/2015   MCV 77.1* 09/04/2015   PLT 294 09/04/2015      Chemistry      Component Value Date/Time   NA 140 09/04/2015 1024   NA 138 06/02/2014 1350   K 4.2 09/04/2015 1024   K 4.4 06/02/2014 1350   CL 98 06/02/2014 1350   CO2 27 09/04/2015 1024   CO2 28 06/02/2014 1350   BUN 12.5 09/04/2015 1024   BUN 12 06/02/2014 1350   CREATININE 1.0 09/04/2015 1024   CREATININE 0.97 06/02/2014 1350      Component Value Date/Time   CALCIUM 8.9 09/04/2015 1024   CALCIUM 9.5 06/02/2014 1350   ALKPHOS 47 09/04/2015 1024   ALKPHOS 75 06/02/2014 1350   AST 17 09/04/2015 1024   AST 23 06/02/2014 1350   ALT 13 09/04/2015 1024   ALT 27 06/02/2014 1350   BILITOT 0.34 09/04/2015 1024   BILITOT 0.3 06/02/2014 1350      ASSESSMENT & PLAN:  Follicular lymphoma grade iiia, lymph nodes of multiple sites ([place]) Clinically, he has complete response to treatment. We  will continue on maintenance rituximab every other month and he agreed to proceed. I will not recheck another scan till next year Clinically, he has no signs of disease recurrence    Essential hypertension His blood pressure is chronically elevated. The patient has risk factor for heart disease. I will proceed to start him on lisinopril and plan to adjust the dose of medication  Microcytic anemia He has progressive microcytic anemia. I suspect he has bleeding. On further questioning, the patient disclosed significant hemorrhoidal bleeding. I recommend he consult his gastroenterologist for repeat colonoscopy. According to the patient, he had evidence of hemorrhoids. I do not want to start him on iron supplement until after colonoscopy and he agreed with the plan of care.   No orders of the defined types were placed in this encounter.   All questions were answered. The patient knows to call the clinic with any problems, questions or concerns. No barriers to learning was detected. I spent 25 minutes counseling the patient  face to face. The total time spent in the appointment was 30 minutes and more than 50% was on counseling and review of test results     [facility], [place], MD 09/04/2015 11:28 AM

## 2015-09-04 NOTE — Assessment & Plan Note (Signed)
Clinically, he has complete response to treatment. We will continue on maintenance rituximab every other month and he agreed to proceed. I will not recheck another scan till next year Clinically, he has no signs of disease recurrence

## 2015-09-04 NOTE — Telephone Encounter (Signed)
Per staff message and POF I have scheduled appts. Advised scheduler of appts. JMW  

## 2015-09-04 NOTE — Telephone Encounter (Signed)
per pof to sch pt appt-sent MAW email to sch pt trmt-pt aware-gave avs

## 2015-09-04 NOTE — Progress Notes (Signed)
Decision made for second instillation of cathflo once pt reaccessed per okay from pharmacy.  Cathflo drawn up but prior to administration PAC flushed and swift blood return noted.  Pt saline/hep locked and deaccessed and treatment done through already infusing PIV.

## 2015-09-04 NOTE — Assessment & Plan Note (Signed)
He has progressive microcytic anemia. I suspect he has bleeding. On further questioning, the patient disclosed significant hemorrhoidal bleeding. I recommend he consult his gastroenterologist for repeat colonoscopy. According to the patient, he had evidence of hemorrhoids. I do not want to start him on iron supplement until after colonoscopy and he agreed with the plan of care.

## 2015-09-04 NOTE — Patient Instructions (Signed)
[facility] Discharge Instructions for Patients Receiving Chemotherapy  Today you received the following chemotherapy agents Rituxan  To help prevent nausea and vomiting after your treatment, we encourage you to take your nausea medication as needed   If you develop nausea and vomiting that is not controlled by your nausea medication, call the clinic.   BELOW ARE SYMPTOMS THAT SHOULD BE REPORTED IMMEDIATELY:  *FEVER GREATER THAN 100.5 F  *CHILLS WITH OR WITHOUT FEVER  NAUSEA AND VOMITING THAT IS NOT CONTROLLED WITH YOUR NAUSEA MEDICATION  *UNUSUAL SHORTNESS OF BREATH  *UNUSUAL BRUISING OR BLEEDING  TENDERNESS IN MOUTH AND THROAT WITH OR WITHOUT PRESENCE OF ULCERS  *URINARY PROBLEMS  *BOWEL PROBLEMS  UNUSUAL RASH Items with * indicate a potential emergency and should be followed up as soon as possible.  Feel free to call the clinic you have any questions or concerns. The clinic phone number is [phone].  Please show the CHEMO ALERT CARD at check-in to the Emergency Department and triage nurse.   

## 2015-09-04 NOTE — Progress Notes (Signed)
Pt in for port access for labs and tx today. Pt states it has been approx 2 months since last access. Port accessed and flushes well. No blood returned noted, multiple maneuvers attempted with no success. Called and informed charge nurse in infusion, Lavella Lemons, Therapist, sports. TPA will be administered in MD office. Pt sent back to lab for lab draw peripherally. Pt understands TPA can possibly take up to 2 hrs.

## 2015-09-04 NOTE — Progress Notes (Signed)
Patient arrived to infusion 1.5 hours after Cathflo was placed for dwelling.  Residual fluid aspirated from port with NO blood return despite several maneuvers as well. Patient reports NO pain with 78ml of Saline flushed without resistance but No blood return. Again. Port was deaccessed and reaccessed with NO blood return despite port flushing well. Patient was accessed peripherally for infusion today.

## 2015-09-04 NOTE — Patient Instructions (Signed)

## 2015-10-09 ENCOUNTER — Ambulatory Visit (HOSPITAL_BASED_OUTPATIENT_CLINIC_OR_DEPARTMENT_OTHER): Payer: BLUE CROSS/BLUE SHIELD

## 2015-10-09 VITALS — BP 140/83 | HR 82 | Temp 98.1°F | Resp 17

## 2015-10-09 DIAGNOSIS — Z452 Encounter for adjustment and management of vascular access device: Secondary | ICD-10-CM | POA: Diagnosis not present

## 2015-10-09 DIAGNOSIS — Z95828 Presence of other vascular implants and grafts: Secondary | ICD-10-CM

## 2015-10-09 DIAGNOSIS — C8238 Follicular lymphoma grade IIIa, lymph nodes of multiple sites: Secondary | ICD-10-CM | POA: Diagnosis not present

## 2015-10-09 MED ORDER — SODIUM CHLORIDE 0.9 % IJ SOLN
10.0000 mL | INTRAMUSCULAR | Status: DC | PRN
  Administered 2015-10-09: 10 mL via INTRAVENOUS
  Filled 2015-10-09: qty 10

## 2015-10-09 MED ORDER — HEPARIN SOD (PORK) LOCK FLUSH 100 UNIT/ML IV SOLN
500.0000 [IU] | Freq: Once | INTRAVENOUS | Status: AC
  Administered 2015-10-09: 500 [IU] via INTRAVENOUS
  Filled 2015-10-09: qty 5

## 2015-10-09 NOTE — Patient Instructions (Signed)

## 2015-11-03 ENCOUNTER — Ambulatory Visit (HOSPITAL_BASED_OUTPATIENT_CLINIC_OR_DEPARTMENT_OTHER): Payer: BLUE CROSS/BLUE SHIELD | Admitting: Hematology and Oncology

## 2015-11-03 ENCOUNTER — Ambulatory Visit: Payer: BLUE CROSS/BLUE SHIELD

## 2015-11-03 ENCOUNTER — Other Ambulatory Visit (HOSPITAL_BASED_OUTPATIENT_CLINIC_OR_DEPARTMENT_OTHER): Payer: BLUE CROSS/BLUE SHIELD

## 2015-11-03 ENCOUNTER — Ambulatory Visit (HOSPITAL_BASED_OUTPATIENT_CLINIC_OR_DEPARTMENT_OTHER): Payer: BLUE CROSS/BLUE SHIELD

## 2015-11-03 ENCOUNTER — Telehealth: Payer: Self-pay | Admitting: *Deleted

## 2015-11-03 ENCOUNTER — Telehealth: Payer: Self-pay | Admitting: Hematology and Oncology

## 2015-11-03 VITALS — BP 134/80 | HR 80 | Temp 98.1°F | Resp 18

## 2015-11-03 VITALS — BP 145/75 | HR 91 | Temp 98.6°F | Resp 18 | Wt 168.3 lb

## 2015-11-03 DIAGNOSIS — Z5112 Encounter for antineoplastic immunotherapy: Secondary | ICD-10-CM | POA: Diagnosis not present

## 2015-11-03 DIAGNOSIS — C8230 Follicular lymphoma grade IIIa, unspecified site: Secondary | ICD-10-CM

## 2015-11-03 DIAGNOSIS — K2270 Barrett's esophagus without dysplasia: Secondary | ICD-10-CM | POA: Diagnosis not present

## 2015-11-03 DIAGNOSIS — C8238 Follicular lymphoma grade IIIa, lymph nodes of multiple sites: Secondary | ICD-10-CM

## 2015-11-03 DIAGNOSIS — Z95828 Presence of other vascular implants and grafts: Secondary | ICD-10-CM

## 2015-11-03 LAB — COMPREHENSIVE METABOLIC PANEL
ALT: 20 U/L (ref 0–55)
AST: 19 U/L (ref 5–34)
Albumin: 3.8 g/dL (ref 3.5–5.0)
Alkaline Phosphatase: 51 U/L (ref 40–150)
Anion Gap: 11 mEq/L (ref 3–11)
BUN: 12.2 mg/dL (ref 7.0–26.0)
CO2: 22 mEq/L (ref 22–29)
Calcium: 8.9 mg/dL (ref 8.4–10.4)
Chloride: 105 mEq/L (ref 98–109)
Creatinine: 1 mg/dL (ref 0.7–1.3)
EGFR: 78 mL/min/{1.73_m2} — ABNORMAL LOW (ref >90)
Glucose: 165 mg/dl — ABNORMAL HIGH (ref 70–140)
Potassium: 3.7 mEq/L (ref 3.5–5.1)
Sodium: 138 mEq/L (ref 136–145)
Total Bilirubin: 0.38 mg/dL (ref 0.20–1.20)
Total Protein: 6.6 g/dL (ref 6.4–8.3)

## 2015-11-03 LAB — CBC WITH DIFFERENTIAL/PLATELET
BASO%: 0.4 % (ref 0.0–2.0)
Basophils Absolute: 0 10*3/uL (ref 0.0–0.1)
EOS%: 1.4 % (ref 0.0–7.0)
Eosinophils Absolute: 0.1 10*3/uL (ref 0.0–0.5)
HCT: 41.9 % (ref 38.4–49.9)
HGB: 13.4 g/dL (ref 13.0–17.1)
LYMPH%: 16.2 % (ref 14.0–49.0)
MCH: 27.2 pg (ref 27.2–33.4)
MCHC: 32 g/dL (ref 32.0–36.0)
MCV: 84.9 fL (ref 79.3–98.0)
MONO#: 0.3 10*3/uL (ref 0.1–0.9)
MONO%: 8.3 % (ref 0.0–14.0)
NEUT#: 3 10*3/uL (ref 1.5–6.5)
NEUT%: 73.7 % (ref 39.0–75.0)
Platelets: 219 10*3/uL (ref 140–400)
RBC: 4.94 10*6/uL (ref 4.20–5.82)
RDW: 23.5 % — ABNORMAL HIGH (ref 11.0–14.6)
WBC: 4 10*3/uL (ref 4.0–10.3)
lymph#: 0.7 10*3/uL — ABNORMAL LOW (ref 0.9–3.3)

## 2015-11-03 LAB — LACTATE DEHYDROGENASE: LDH: 171 U/L (ref 125–245)

## 2015-11-03 MED ORDER — DIPHENHYDRAMINE HCL 25 MG PO CAPS
50.0000 mg | ORAL_CAPSULE | Freq: Once | ORAL | Status: AC
  Administered 2015-11-03: 50 mg via ORAL

## 2015-11-03 MED ORDER — DIPHENHYDRAMINE HCL 25 MG PO CAPS
ORAL_CAPSULE | ORAL | Status: AC
  Filled 2015-11-03: qty 2

## 2015-11-03 MED ORDER — SODIUM CHLORIDE 0.9 % IV SOLN
375.0000 mg/m2 | Freq: Once | INTRAVENOUS | Status: AC
  Administered 2015-11-03: 700 mg via INTRAVENOUS
  Filled 2015-11-03: qty 70

## 2015-11-03 MED ORDER — ACETAMINOPHEN 325 MG PO TABS
650.0000 mg | ORAL_TABLET | Freq: Once | ORAL | Status: AC
  Administered 2015-11-03: 650 mg via ORAL

## 2015-11-03 MED ORDER — SODIUM CHLORIDE 0.9% FLUSH
10.0000 mL | INTRAVENOUS | Status: DC | PRN
  Administered 2015-11-03: 10 mL via INTRAVENOUS
  Filled 2015-11-03: qty 10

## 2015-11-03 MED ORDER — SODIUM CHLORIDE 0.9 % IJ SOLN
10.0000 mL | INTRAMUSCULAR | Status: DC | PRN
  Administered 2015-11-03: 10 mL
  Filled 2015-11-03: qty 10

## 2015-11-03 MED ORDER — ACETAMINOPHEN 325 MG PO TABS
ORAL_TABLET | ORAL | Status: AC
  Filled 2015-11-03: qty 2

## 2015-11-03 MED ORDER — SODIUM CHLORIDE 0.9 % IV SOLN
Freq: Once | INTRAVENOUS | Status: AC
  Administered 2015-11-03: 09:00:00 via INTRAVENOUS

## 2015-11-03 MED ORDER — HEPARIN SOD (PORK) LOCK FLUSH 100 UNIT/ML IV SOLN
500.0000 [IU] | Freq: Once | INTRAVENOUS | Status: AC | PRN
  Administered 2015-11-03: 500 [IU]
  Filled 2015-11-03: qty 5

## 2015-11-03 NOTE — Progress Notes (Signed)
[place] OFFICE PROGRESS NOTE  Patient Care Team: Christain Sacramento, MD as PCP - General (Family Medicine) Jackolyn Confer, MD as Referring Physician (General Surgery) Heath Lark, MD as Consulting Physician (Hematology and Oncology)  SUMMARY OF ONCOLOGIC HISTORY: Oncology History   Follicular lymphoma grade 3a   Primary site: Lymphoid Neoplasms   Staging method: AJCC 6th Edition   Clinical free text: Bone marrow is involved on PET; Possible component of DLBCL   Clinical: Stage IV signed by Heath Lark, MD on 06/18/2014  8:05 PM   Summary: Stage IV       Follicular lymphoma grade iiia, lymph nodes of multiple sites ([place])   05/13/2014 Imaging CT scan of chest and neck showed extensive lymphadenopathy in the neck and medistinum   05/19/2014 Procedure [id number] US biopsy only showed necrotic tissue   06/03/2014 Surgery Excisional LN biopsy of right supraclavicular region showed follicular lymphoma grade 3 [id number]   06/14/2014 Imaging PET CT scan showed diffuse, bulky lymphadenopathy, splenic involvement and bone marrow disease   06/16/2014 Imaging ECHO showed preserved EF 50%   06/17/2014 Procedure He has port placement   06/20/2014 - 09/12/2014 Chemotherapy He received 5 cycles of chemotherapy with R. CHOP along with intrathecal methotrexate. He received 2 doses of IT MTX. He did not receive the final cycle of CHOP due to side effects   08/20/2014 Imaging Repeat PET/CT scan showed significant improvement with near-complete response to treatment.   10/24/2014 Imaging Repeat PET CT scan showed near complete resolution of lymphadenopathy   03/03/2015 Imaging Repeat PET CT showed no evidence of active disease   09/29/2015 Procedure He underwent endoscopy which showed Barrett's esophagus and angiodysplasia    INTERVAL HISTORY: Please see below for problem oriented charting.  he feels well. Symptoms of reflux has resolved. He tolerated oral iron supplement one well with good  energy. Denies recent infection. No new lymphadenopathy. He is ready to proceed with rituximab today.  REVIEW OF SYSTEMS:   Constitutional: Denies fevers, chills or abnormal weight loss Eyes: Denies blurriness of vision Ears, nose, mouth, throat, and face: Denies mucositis or sore throat Respiratory: Denies cough, dyspnea or wheezes Cardiovascular: Denies palpitation, chest discomfort or lower extremity swelling Gastrointestinal:  Denies nausea, heartburn or change in bowel habits Skin: Denies abnormal skin rashes Lymphatics: Denies new lymphadenopathy or easy bruising Neurological:Denies numbness, tingling or new weaknesses Behavioral/Psych: Mood is stable, no new changes  All other systems were reviewed with the patient and are negative.  I have reviewed the past medical history, past surgical history, social history and family history with the patient and they are unchanged from previous note.  ALLERGIES:  has No Known Allergies.  MEDICATIONS:  Current Outpatient Prescriptions  Medication Sig Dispense Refill  . cholecalciferol (VITAMIN D) 1000 UNITS tablet Take 2,000 Units by mouth daily.     . Ferrous Sulfate (IRON) 325 (65 Fe) MG TABS Take by mouth 2 (two) times daily.    Marland Kitchen ibuprofen (ADVIL,MOTRIN) 200 MG tablet Take 200 mg by mouth every 6 (six) hours as needed.    . lidocaine-prilocaine (EMLA) cream Apply 1 application topically as needed. 30 g 6  . lisinopril (PRINIVIL,ZESTRIL) 10 MG tablet Take 1 tablet (10 mg total) by mouth daily. 90 tablet 1  . Melatonin 5 MG TABS Take 10 mg by mouth at bedtime as needed.    Marland Kitchen omeprazole (PRILOSEC) 20 MG capsule Take 20 mg by mouth 2 (two) times daily before a meal.  No current facility-administered medications for this visit.    PHYSICAL EXAMINATION: ECOG PERFORMANCE STATUS: 0 - Asymptomatic  Filed Vitals:   11/03/15 0842  BP: 145/75  Pulse: 91  Temp: 98.6 F (37 C)  Resp: 18   Filed Weights   11/03/15 0842  Weight:  168 lb 4.8 oz (76.34 kg)    GENERAL:alert, no distress and comfortable SKIN: skin color, texture, turgor are normal, no rashes or significant lesions EYES: normal, Conjunctiva are pink and non-injected, sclera clear OROPHARYNX:no exudate, no erythema and lips, buccal mucosa, and tongue normal  NECK: supple, thyroid normal size, non-tender, without nodularity LYMPH:  no palpable lymphadenopathy in the cervical, axillary or inguinal LUNGS: clear to auscultation and percussion with normal breathing effort HEART: regular rate & rhythm and no murmurs and no lower extremity edema ABDOMEN:abdomen soft, non-tender and normal bowel sounds Musculoskeletal:no cyanosis of digits and no clubbing  NEURO: alert & oriented x 3 with fluent speech, no focal motor/sensory deficits  LABORATORY DATA:  I have reviewed the data as listed    Component Value Date/Time   NA 138 11/03/2015 0815   NA 138 06/02/2014 1350   K 3.7 11/03/2015 0815   K 4.4 06/02/2014 1350   CL 98 06/02/2014 1350   CO2 22 11/03/2015 0815   CO2 28 06/02/2014 1350   GLUCOSE 165* 11/03/2015 0815   GLUCOSE 85 06/02/2014 1350   BUN 12.2 11/03/2015 0815   BUN 12 06/02/2014 1350   CREATININE 1.0 11/03/2015 0815   CREATININE 0.97 06/02/2014 1350   CALCIUM 8.9 11/03/2015 0815   CALCIUM 9.5 06/02/2014 1350   PROT 6.6 11/03/2015 0815   PROT 7.3 06/02/2014 1350   ALBUMIN 3.8 11/03/2015 0815   ALBUMIN 3.9 06/02/2014 1350   AST 19 11/03/2015 0815   AST 23 06/02/2014 1350   ALT 20 11/03/2015 0815   ALT 27 06/02/2014 1350   ALKPHOS 51 11/03/2015 0815   ALKPHOS 75 06/02/2014 1350   BILITOT 0.38 11/03/2015 0815   BILITOT 0.3 06/02/2014 1350   GFRNONAA 90* 06/02/2014 1350   GFRAA >90 06/02/2014 1350    No results found for: SPEP, UPEP  Lab Results  Component Value Date   WBC 4.0 11/03/2015   NEUTROABS 3.0 11/03/2015   HGB 13.4 11/03/2015   HCT 41.9 11/03/2015   MCV 84.9 11/03/2015   PLT 219 11/03/2015      Chemistry       Component Value Date/Time   NA 138 11/03/2015 0815   NA 138 06/02/2014 1350   K 3.7 11/03/2015 0815   K 4.4 06/02/2014 1350   CL 98 06/02/2014 1350   CO2 22 11/03/2015 0815   CO2 28 06/02/2014 1350   BUN 12.2 11/03/2015 0815   BUN 12 06/02/2014 1350   CREATININE 1.0 11/03/2015 0815   CREATININE 0.97 06/02/2014 1350      Component Value Date/Time   CALCIUM 8.9 11/03/2015 0815   CALCIUM 9.5 06/02/2014 1350   ALKPHOS 51 11/03/2015 0815   ALKPHOS 75 06/02/2014 1350   AST 19 11/03/2015 0815   AST 23 06/02/2014 1350   ALT 20 11/03/2015 0815   ALT 27 06/02/2014 1350   BILITOT 0.38 11/03/2015 0815   BILITOT 0.3 06/02/2014 1350      ASSESSMENT & PLAN:   Follicular lymphoma grade iiia, lymph nodes of multiple sites ([place]) Clinically, he has complete response to treatment. We will continue on maintenance rituximab every other month and he agreed to proceed. I will not recheck another  scan until May 2017 Clinically, he has no signs of disease recurrence      Barrett esophagus  This was discovered by gastroenterologist. He is currently on proton, inhibitor and is doing well without symptoms of reflux. He will continue close follow-up with GI.  His anemia has resolved     All questions were answered. The patient knows to call the clinic with any problems, questions or concerns. No barriers to learning was detected. I spent 20 minutes counseling the patient face to face. The total time spent in the appointment was 25 minutes and more than 50% was on counseling and review of test results     [facility] [place], MD 11/03/2015 5:00 PM

## 2015-11-03 NOTE — Patient Instructions (Signed)
[facility] Discharge Instructions for Patients Receiving Chemotherapy  Today you received the following chemotherapy agents: Rituxan   To help prevent nausea and vomiting after your treatment, we encourage you to take your nausea medication as directed.    If you develop nausea and vomiting that is not controlled by your nausea medication, call the clinic.   BELOW ARE SYMPTOMS THAT SHOULD BE REPORTED IMMEDIATELY:  *FEVER GREATER THAN 100.5 F  *CHILLS WITH OR WITHOUT FEVER  NAUSEA AND VOMITING THAT IS NOT CONTROLLED WITH YOUR NAUSEA MEDICATION  *UNUSUAL SHORTNESS OF BREATH  *UNUSUAL BRUISING OR BLEEDING  TENDERNESS IN MOUTH AND THROAT WITH OR WITHOUT PRESENCE OF ULCERS  *URINARY PROBLEMS  *BOWEL PROBLEMS  UNUSUAL RASH Items with * indicate a potential emergency and should be followed up as soon as possible.  Feel free to call the clinic you have any questions or concerns. The clinic phone number is [phone].  Please show the CHEMO ALERT CARD at check-in to the Emergency Department and triage nurse.   

## 2015-11-03 NOTE — Telephone Encounter (Signed)
Talked with and scheduled this patient’s appointment(s) while patient was here in our office.       [facility]. °

## 2015-11-03 NOTE — Patient Instructions (Signed)

## 2015-11-03 NOTE — Telephone Encounter (Signed)
Per staff message and POF I have scheduled appts. Advised scheduler of appts. JMW  

## 2015-11-04 DIAGNOSIS — K2270 Barrett's esophagus without dysplasia: Secondary | ICD-10-CM | POA: Insufficient documentation

## 2015-11-04 NOTE — Assessment & Plan Note (Signed)
Clinically, he has complete response to treatment. We will continue on maintenance rituximab every other month and he agreed to proceed. I will not recheck another scan until May 2017 Clinically, he has no signs of disease recurrence

## 2015-11-04 NOTE — Assessment & Plan Note (Signed)
This was discovered by gastroenterologist. He is currently on proton, inhibitor and is doing well without symptoms of reflux. He will continue close follow-up with GI.  His anemia has resolved

## 2015-12-03 ENCOUNTER — Telehealth: Payer: Self-pay | Admitting: *Deleted

## 2015-12-03 NOTE — Telephone Encounter (Signed)
LVM for pt informing of Dr. Calton Dach message below.  Asked him to please call back.  Also left VM for pt's wife w/ same information and to please call back.

## 2015-12-03 NOTE — Telephone Encounter (Signed)
Wife reports pt has Shingles and he continues to go to work with Shingles.  Says it is painful for him but he doesn't want to "slow down."   She is concerned and asks if he should be resting or if ok to keep working while he has Shingles?

## 2015-12-03 NOTE — Telephone Encounter (Signed)
Pt called back and reports he was actually started on Valcyclovir 1 gram TID for 7 days by his PCP.  He started this yesterday.  Informed pt he does not need to come in to see Dr. Alvy Bimler today since he has already been started on Treatment.  Instructed he does need to rest for 3 days.  He has a virus and needs to rest.  He says he will rest for the next few days.  Instructed him to call us if he gets worse or any new problems.  He verbalized understanding.

## 2015-12-03 NOTE — Telephone Encounter (Signed)
He needs to be started on acyclovir and bedrest for at least 3 days I can see him today if he would come in later at 330 pm

## 2015-12-10 ENCOUNTER — Telehealth: Payer: Self-pay | Admitting: *Deleted

## 2015-12-10 ENCOUNTER — Other Ambulatory Visit: Payer: Self-pay | Admitting: *Deleted

## 2015-12-10 MED ORDER — GABAPENTIN 300 MG PO CAPS: 300.0000 mg | ORAL_CAPSULE | Freq: Three times a day (TID) | ORAL | Status: DC

## 2015-12-10 NOTE — Telephone Encounter (Signed)
Spoke with patient- will call in Neurontin for shingles pain. Reviewed message from Dr Alvy Bimler with patient

## 2015-12-10 NOTE — Telephone Encounter (Signed)
There is no quick relief for shingles pain We can call in Neurontin 300 mg TID (90 tabs, 1 refill) for neuropathic pain but it is unlikely going to work that quickly over the weekend and narcotics do not help in general. Please warn patient against risk of sedation, drowsiness and constipation if he is willing to take it Most patients with shingles take time off work to rest

## 2015-12-10 NOTE — Telephone Encounter (Signed)
Patient was seen in urgent care for shingles and is taking Acyclovir, has 2 pills left. He had spoken with Cameo who told him to call us if things did not improve. States that rash is drying up but is extremely painful. He has been taking ibuprofen with no relief and using his Emla cream which has helped some. He is going out of town on Monday for a new job and wanted to get this under control before then.

## 2015-12-30 ENCOUNTER — Other Ambulatory Visit: Payer: Self-pay | Admitting: Hematology and Oncology

## 2016-01-01 ENCOUNTER — Ambulatory Visit: Payer: BLUE CROSS/BLUE SHIELD

## 2016-01-01 ENCOUNTER — Other Ambulatory Visit (HOSPITAL_BASED_OUTPATIENT_CLINIC_OR_DEPARTMENT_OTHER): Payer: BLUE CROSS/BLUE SHIELD

## 2016-01-01 ENCOUNTER — Ambulatory Visit (HOSPITAL_BASED_OUTPATIENT_CLINIC_OR_DEPARTMENT_OTHER): Payer: BLUE CROSS/BLUE SHIELD | Admitting: Hematology and Oncology

## 2016-01-01 ENCOUNTER — Other Ambulatory Visit: Payer: BLUE CROSS/BLUE SHIELD

## 2016-01-01 ENCOUNTER — Ambulatory Visit (HOSPITAL_BASED_OUTPATIENT_CLINIC_OR_DEPARTMENT_OTHER): Payer: BLUE CROSS/BLUE SHIELD

## 2016-01-01 ENCOUNTER — Encounter: Payer: Self-pay | Admitting: Hematology and Oncology

## 2016-01-01 ENCOUNTER — Telehealth: Payer: Self-pay | Admitting: Hematology and Oncology

## 2016-01-01 VITALS — BP 149/90 | HR 75 | Temp 98.3°F | Resp 18 | Ht 70.0 in | Wt 169.7 lb

## 2016-01-01 DIAGNOSIS — C8230 Follicular lymphoma grade IIIa, unspecified site: Secondary | ICD-10-CM

## 2016-01-01 DIAGNOSIS — C8238 Follicular lymphoma grade IIIa, lymph nodes of multiple sites: Secondary | ICD-10-CM

## 2016-01-01 DIAGNOSIS — Z8619 Personal history of other infectious and parasitic diseases: Secondary | ICD-10-CM | POA: Diagnosis not present

## 2016-01-01 DIAGNOSIS — Z95828 Presence of other vascular implants and grafts: Secondary | ICD-10-CM

## 2016-01-01 DIAGNOSIS — D72819 Decreased white blood cell count, unspecified: Secondary | ICD-10-CM | POA: Diagnosis not present

## 2016-01-01 DIAGNOSIS — Z5112 Encounter for antineoplastic immunotherapy: Secondary | ICD-10-CM

## 2016-01-01 DIAGNOSIS — I10 Essential (primary) hypertension: Secondary | ICD-10-CM | POA: Diagnosis not present

## 2016-01-01 LAB — CBC WITH DIFFERENTIAL/PLATELET
BASO%: 0.9 % (ref 0.0–2.0)
Basophils Absolute: 0 10*3/uL (ref 0.0–0.1)
EOS%: 2.9 % (ref 0.0–7.0)
Eosinophils Absolute: 0.1 10*3/uL (ref 0.0–0.5)
HCT: 40.5 % (ref 38.4–49.9)
HGB: 13.6 g/dL (ref 13.0–17.1)
LYMPH%: 22.9 % (ref 14.0–49.0)
MCH: 30.3 pg (ref 27.2–33.4)
MCHC: 33.6 g/dL (ref 32.0–36.0)
MCV: 90.2 fL (ref 79.3–98.0)
MONO#: 0.4 10*3/uL (ref 0.1–0.9)
MONO%: 12.6 % (ref 0.0–14.0)
NEUT#: 2.1 10*3/uL (ref 1.5–6.5)
NEUT%: 60.7 % (ref 39.0–75.0)
Platelets: 214 10*3/uL (ref 140–400)
RBC: 4.49 10*6/uL (ref 4.20–5.82)
RDW: 14.7 % — ABNORMAL HIGH (ref 11.0–14.6)
WBC: 3.5 10*3/uL — ABNORMAL LOW (ref 4.0–10.3)
lymph#: 0.8 10*3/uL — ABNORMAL LOW (ref 0.9–3.3)

## 2016-01-01 LAB — COMPREHENSIVE METABOLIC PANEL
ALT: 19 U/L (ref 0–55)
AST: 18 U/L (ref 5–34)
Albumin: 3.9 g/dL (ref 3.5–5.0)
Alkaline Phosphatase: 45 U/L (ref 40–150)
Anion Gap: 6 mEq/L (ref 3–11)
BUN: 15.6 mg/dL (ref 7.0–26.0)
CO2: 28 mEq/L (ref 22–29)
Calcium: 9 mg/dL (ref 8.4–10.4)
Chloride: 104 mEq/L (ref 98–109)
Creatinine: 0.8 mg/dL (ref 0.7–1.3)
EGFR: >90 mL/min/{1.73_m2} (ref >90)
Glucose: 86 mg/dl (ref 70–140)
Potassium: 4.3 mEq/L (ref 3.5–5.1)
Sodium: 139 mEq/L (ref 136–145)
Total Bilirubin: 0.39 mg/dL (ref 0.20–1.20)
Total Protein: 6.5 g/dL (ref 6.4–8.3)

## 2016-01-01 LAB — LACTATE DEHYDROGENASE: LDH: 184 U/L (ref 125–245)

## 2016-01-01 MED ORDER — HEPARIN SOD (PORK) LOCK FLUSH 100 UNIT/ML IV SOLN
500.0000 [IU] | Freq: Once | INTRAVENOUS | Status: AC | PRN
  Administered 2016-01-01: 500 [IU]
  Filled 2016-01-01: qty 5

## 2016-01-01 MED ORDER — RITUXIMAB CHEMO INJECTION 500 MG/50ML
375.0000 mg/m2 | Freq: Once | INTRAVENOUS | Status: AC
  Administered 2016-01-01: 700 mg via INTRAVENOUS
  Filled 2016-01-01: qty 60

## 2016-01-01 MED ORDER — LIDOCAINE-PRILOCAINE 2.5-2.5 % EX CREA: 1.0000 "application " | TOPICAL_CREAM | CUTANEOUS | Status: DC | PRN

## 2016-01-01 MED ORDER — DIPHENHYDRAMINE HCL 25 MG PO CAPS
50.0000 mg | ORAL_CAPSULE | Freq: Once | ORAL | Status: AC
  Administered 2016-01-01: 50 mg via ORAL

## 2016-01-01 MED ORDER — ACETAMINOPHEN 325 MG PO TABS
650.0000 mg | ORAL_TABLET | Freq: Once | ORAL | Status: AC
  Administered 2016-01-01: 650 mg via ORAL

## 2016-01-01 MED ORDER — SODIUM CHLORIDE 0.9 % IJ SOLN
10.0000 mL | INTRAMUSCULAR | Status: DC | PRN
  Administered 2016-01-01: 10 mL
  Filled 2016-01-01: qty 10

## 2016-01-01 MED ORDER — ACETAMINOPHEN 325 MG PO TABS
ORAL_TABLET | ORAL | Status: AC
  Filled 2016-01-01: qty 2

## 2016-01-01 MED ORDER — SODIUM CHLORIDE 0.9 % IV SOLN
Freq: Once | INTRAVENOUS | Status: AC
  Administered 2016-01-01: 09:00:00 via INTRAVENOUS

## 2016-01-01 MED ORDER — DIPHENHYDRAMINE HCL 25 MG PO CAPS
ORAL_CAPSULE | ORAL | Status: AC
  Filled 2016-01-01: qty 2

## 2016-01-01 MED ORDER — SODIUM CHLORIDE 0.9% FLUSH
10.0000 mL | INTRAVENOUS | Status: DC | PRN
  Administered 2016-01-01: 10 mL via INTRAVENOUS
  Filled 2016-01-01: qty 10

## 2016-01-01 NOTE — Assessment & Plan Note (Signed)
This is likely due to recent viral Infection.I will observe for now.  I will continue the chemotherapy at current dose without dosage adjustment.   

## 2016-01-01 NOTE — Telephone Encounter (Signed)
Gave patient avs report and appointments for June. Central radiology scheduling will contact patient re pet scan - patient aware.

## 2016-01-01 NOTE — Patient Instructions (Signed)

## 2016-01-01 NOTE — Assessment & Plan Note (Signed)
Clinically, he has complete response to treatment. We will continue on maintenance rituximab every other month and he agreed to proceed. I will repeat PET scan in May 2017 Clinically, he has no signs of disease recurrence

## 2016-01-01 NOTE — Progress Notes (Signed)
[facility] OFFICE PROGRESS NOTE  Patient Care Team: Christain Sacramento, MD as PCP - General (Family Medicine) Jackolyn Confer, MD as Referring Physician (General Surgery) Heath Lark, MD as Consulting Physician (Hematology and Oncology) Carol Ada, MD as Consulting Physician (Gastroenterology)  SUMMARY OF ONCOLOGIC HISTORY: Oncology History   Follicular lymphoma grade 3a   Primary site: Lymphoid Neoplasms   Staging method: AJCC 6th Edition   Clinical free text: Bone marrow is involved on PET; Possible component of DLBCL   Clinical: Stage IV signed by Heath Lark, MD on 06/18/2014  8:05 PM   Summary: Stage IV       Follicular lymphoma grade iiia, lymph nodes of multiple sites ([place])   05/13/2014 Imaging CT scan of chest and neck showed extensive lymphadenopathy in the neck and medistinum   05/19/2014 Procedure [id number] US biopsy only showed necrotic tissue   06/03/2014 Surgery Excisional LN biopsy of right supraclavicular region showed follicular lymphoma grade 3 [id number]   06/14/2014 Imaging PET CT scan showed diffuse, bulky lymphadenopathy, splenic involvement and bone marrow disease   06/16/2014 Imaging ECHO showed preserved EF 50%   06/17/2014 Procedure He has port placement   06/20/2014 - 09/12/2014 Chemotherapy He received 5 cycles of chemotherapy with R. CHOP along with intrathecal methotrexate. He received 2 doses of IT MTX. He did not receive the final cycle of CHOP due to side effects   08/20/2014 Imaging Repeat PET/CT scan showed significant improvement with near-complete response to treatment.   10/24/2014 Imaging Repeat PET CT scan showed near complete resolution of lymphadenopathy   03/03/2015 Imaging Repeat PET CT showed no evidence of active disease   09/29/2015 Procedure He underwent endoscopy which showed Barrett's esophagus and angiodysplasia    INTERVAL HISTORY: Please see below for problem oriented charting. The patient had recent shingles due to excessive  stress at work. It has since healed. There is some residual postherpetic neuralgia and he is taking gabapentin at nighttime. He rated his pain at 2-3 out of 10 pain along the left chest wall radiating to the left flank. He denies new lymphadenopathy. No other new recent infection requiring antibiotic therapy.  REVIEW OF SYSTEMS:   Constitutional: Denies fevers, chills or abnormal weight loss Eyes: Denies blurriness of vision Ears, nose, mouth, throat, and face: Denies mucositis or sore throat Respiratory: Denies cough, dyspnea or wheezes Cardiovascular: Denies palpitation, chest discomfort or lower extremity swelling Gastrointestinal:  Denies nausea, heartburn or change in bowel habits Skin: Denies abnormal skin rashes Lymphatics: Denies new lymphadenopathy or easy bruising Neurological:Denies numbness, tingling or new weaknesses Behavioral/Psych: Mood is stable, no new changes  All other systems were reviewed with the patient and are negative.  I have reviewed the past medical history, past surgical history, social history and family history with the patient and they are unchanged from previous note.  ALLERGIES:  has No Known Allergies.  MEDICATIONS:  Current Outpatient Prescriptions  Medication Sig Dispense Refill  . cholecalciferol (VITAMIN D) 1000 UNITS tablet Take 2,000 Units by mouth daily.     . Ferrous Sulfate (IRON) 325 (65 Fe) MG TABS Take by mouth 2 (two) times daily.    Marland Kitchen gabapentin (NEURONTIN) 300 MG capsule Take 1 capsule (300 mg total) by mouth 3 (three) times daily. 90 capsule 1  . ibuprofen (ADVIL,MOTRIN) 200 MG tablet Take 200 mg by mouth every 6 (six) hours as needed.    . lidocaine-prilocaine (EMLA) cream Apply 1 application topically as needed. 30 g 6  . lisinopril (  PRINIVIL,ZESTRIL) 10 MG tablet Take 1 tablet (10 mg total) by mouth daily. 90 tablet 1  . Melatonin 5 MG TABS Take 10 mg by mouth at bedtime as needed.    Marland Kitchen omeprazole (PRILOSEC) 20 MG capsule Take  20 mg by mouth 2 (two) times daily before a meal.     No current facility-administered medications for this visit.   Facility-Administered Medications Ordered in Other Visits  Medication Dose Route Frequency Provider Last Rate Last Dose  . heparin lock flush 100 unit/mL  500 Units Intracatheter Once PRN Heath Lark, MD      . riTUXimab (RITUXAN) 700 mg in sodium chloride 0.9 % 180 mL chemo infusion  375 mg/m2 (Treatment Plan Actual) Intravenous Once Heath Lark, MD      . sodium chloride 0.9 % injection 10 mL  10 mL Intracatheter PRN Heath Lark, MD        PHYSICAL EXAMINATION: ECOG PERFORMANCE STATUS: 1 - Symptomatic but completely ambulatory  Filed Vitals:   01/01/16 0841  BP: 149/90  Pulse: 75  Temp: 98.3 F (36.8 C)  Resp: 18   Filed Weights   01/01/16 0841  Weight: 169 lb 11.2 oz (76.975 kg)    GENERAL:alert, no distress and comfortable SKIN: skin color, texture, turgor are normal, no rashes or significant lesions EYES: normal, Conjunctiva are pink and non-injected, sclera clear OROPHARYNX:no exudate, no erythema and lips, buccal mucosa, and tongue normal  NECK: supple, thyroid normal size, non-tender, without nodularity LYMPH:  no palpable lymphadenopathy in the cervical, axillary or inguinal LUNGS: clear to auscultation and percussion with normal breathing effort HEART: regular rate & rhythm and no murmurs and no lower extremity edema ABDOMEN:abdomen soft, non-tender and normal bowel sounds Musculoskeletal:no cyanosis of digits and no clubbing  NEURO: alert & oriented x 3 with fluent speech, no focal motor/sensory deficits  LABORATORY DATA:  I have reviewed the data as listed    Component Value Date/Time   NA 139 01/01/2016 0812   NA 138 06/02/2014 1350   K 4.3 01/01/2016 0812   K 4.4 06/02/2014 1350   CL 98 06/02/2014 1350   CO2 28 01/01/2016 0812   CO2 28 06/02/2014 1350   GLUCOSE 86 01/01/2016 0812   GLUCOSE 85 06/02/2014 1350   BUN 15.6 01/01/2016 0812    BUN 12 06/02/2014 1350   CREATININE 0.8 01/01/2016 0812   CREATININE 0.97 06/02/2014 1350   CALCIUM 9.0 01/01/2016 0812   CALCIUM 9.5 06/02/2014 1350   PROT 6.5 01/01/2016 0812   PROT 7.3 06/02/2014 1350   ALBUMIN 3.9 01/01/2016 0812   ALBUMIN 3.9 06/02/2014 1350   AST 18 01/01/2016 0812   AST 23 06/02/2014 1350   ALT 19 01/01/2016 0812   ALT 27 06/02/2014 1350   ALKPHOS 45 01/01/2016 0812   ALKPHOS 75 06/02/2014 1350   BILITOT 0.39 01/01/2016 0812   BILITOT 0.3 06/02/2014 1350   GFRNONAA 90* 06/02/2014 1350   GFRAA >90 06/02/2014 1350    No results found for: SPEP, UPEP  Lab Results  Component Value Date   WBC 3.5* 01/01/2016   NEUTROABS 2.1 01/01/2016   HGB 13.6 01/01/2016   HCT 40.5 01/01/2016   MCV 90.2 01/01/2016   PLT 214 01/01/2016      Chemistry      Component Value Date/Time   NA 139 01/01/2016 0812   NA 138 06/02/2014 1350   K 4.3 01/01/2016 0812   K 4.4 06/02/2014 1350   CL 98 06/02/2014 1350  CO2 28 01/01/2016 0812   CO2 28 06/02/2014 1350   BUN 15.6 01/01/2016 0812   BUN 12 06/02/2014 1350   CREATININE 0.8 01/01/2016 0812   CREATININE 0.97 06/02/2014 1350      Component Value Date/Time   CALCIUM 9.0 01/01/2016 0812   CALCIUM 9.5 06/02/2014 1350   ALKPHOS 45 01/01/2016 0812   ALKPHOS 75 06/02/2014 1350   AST 18 01/01/2016 0812   AST 23 06/02/2014 1350   ALT 19 01/01/2016 0812   ALT 27 06/02/2014 1350   BILITOT 0.39 01/01/2016 0812   BILITOT 0.3 06/02/2014 1350     ASSESSMENT & PLAN:  Follicular lymphoma grade iiia, lymph nodes of multiple sites ([place]) Clinically, he has complete response to treatment. We will continue on maintenance rituximab every other month and he agreed to proceed. I will repeat PET scan in May 2017 Clinically, he has no signs of disease recurrence   Chronic leukopenia This is likely due to recent viral Infection.I will observe for now.  I will continue the chemotherapy at current dose without dosage adjustment.    History of shingles He had recent shingles with mild persistent postherpetic neuralgia. I recommend he continues taking gabapentin and consider topical lidocaine cream as needed  Essential hypertension His blood pressure remain mildly elevated. He will continue lisinopril. I recommend dietary modification and exercise. If it remains high in the next visit, I will consider increasing the dose of lisinopril.   Orders Placed This Encounter  Procedures  . NM PET Image Restag (PS) Skull Base To Thigh    Standing Status: Future     Number of Occurrences:      Standing Expiration Date: 03/02/2017    Order Specific Question:  Reason for Exam (SYMPTOM  OR DIAGNOSIS REQUIRED)    Answer:  staging lymphoma assess response to Rx    Order Specific Question:  Preferred imaging location?    Answer:  [facility]   All questions were answered. The patient knows to call the clinic with any problems, questions or concerns. No barriers to learning was detected. I spent 25 minutes counseling the patient face to face. The total time spent in the appointment was 30 minutes and more than 50% was on counseling and review of test results     [facility], [place], MD 01/01/2016 9:46 AM

## 2016-01-01 NOTE — Assessment & Plan Note (Signed)
He had recent shingles with mild persistent postherpetic neuralgia. I recommend he continues taking gabapentin and consider topical lidocaine cream as needed

## 2016-01-01 NOTE — Assessment & Plan Note (Signed)
His blood pressure remain mildly elevated. He will continue lisinopril. I recommend dietary modification and exercise. If it remains high in the next visit, I will consider increasing the dose of lisinopril.

## 2016-01-01 NOTE — Patient Instructions (Signed)
[facility] Discharge Instructions for Patients Receiving Chemotherapy  Today you received the following chemotherapy agents: Rituxan   To help prevent nausea and vomiting after your treatment, we encourage you to take your nausea medication as directed.    If you develop nausea and vomiting that is not controlled by your nausea medication, call the clinic.   BELOW ARE SYMPTOMS THAT SHOULD BE REPORTED IMMEDIATELY:  *FEVER GREATER THAN 100.5 F  *CHILLS WITH OR WITHOUT FEVER  NAUSEA AND VOMITING THAT IS NOT CONTROLLED WITH YOUR NAUSEA MEDICATION  *UNUSUAL SHORTNESS OF BREATH  *UNUSUAL BRUISING OR BLEEDING  TENDERNESS IN MOUTH AND THROAT WITH OR WITHOUT PRESENCE OF ULCERS  *URINARY PROBLEMS  *BOWEL PROBLEMS  UNUSUAL RASH Items with * indicate a potential emergency and should be followed up as soon as possible.  Feel free to call the clinic you have any questions or concerns. The clinic phone number is [phone].  Please show the CHEMO ALERT CARD at check-in to the Emergency Department and triage nurse.   

## 2016-03-03 ENCOUNTER — Ambulatory Visit (HOSPITAL_COMMUNITY)
Admission: RE | Admit: 2016-03-03 | Discharge: 2016-03-03 | Disposition: A | Discharge status: Home / Self Care | Payer: BLUE CROSS/BLUE SHIELD | Source: Ambulatory Visit | Attending: Hematology and Oncology | Admitting: Hematology and Oncology

## 2016-03-03 DIAGNOSIS — R935 Abnormal findings on diagnostic imaging of other abdominal regions, including retroperitoneum: Secondary | ICD-10-CM | POA: Diagnosis not present

## 2016-03-03 DIAGNOSIS — C8238 Follicular lymphoma grade IIIa, lymph nodes of multiple sites: Secondary | ICD-10-CM | POA: Diagnosis present

## 2016-03-03 LAB — GLUCOSE, CAPILLARY: Glucose-Capillary: 107 mg/dL — ABNORMAL HIGH (ref 65–99)

## 2016-03-03 MED ORDER — FLUDEOXYGLUCOSE F - 18 (FDG) INJECTION
8.3000 | Freq: Once | INTRAVENOUS | Status: AC | PRN
  Administered 2016-03-03: 8.3 via INTRAVENOUS

## 2016-03-04 ENCOUNTER — Ambulatory Visit (HOSPITAL_BASED_OUTPATIENT_CLINIC_OR_DEPARTMENT_OTHER): Payer: BLUE CROSS/BLUE SHIELD

## 2016-03-04 ENCOUNTER — Telehealth: Payer: Self-pay | Admitting: Hematology and Oncology

## 2016-03-04 ENCOUNTER — Other Ambulatory Visit: Payer: Self-pay | Admitting: Hematology and Oncology

## 2016-03-04 ENCOUNTER — Encounter: Payer: Self-pay | Admitting: Hematology and Oncology

## 2016-03-04 ENCOUNTER — Ambulatory Visit (HOSPITAL_BASED_OUTPATIENT_CLINIC_OR_DEPARTMENT_OTHER): Payer: BLUE CROSS/BLUE SHIELD | Admitting: Hematology and Oncology

## 2016-03-04 ENCOUNTER — Ambulatory Visit: Payer: BLUE CROSS/BLUE SHIELD

## 2016-03-04 ENCOUNTER — Other Ambulatory Visit (HOSPITAL_BASED_OUTPATIENT_CLINIC_OR_DEPARTMENT_OTHER): Payer: BLUE CROSS/BLUE SHIELD

## 2016-03-04 ENCOUNTER — Telehealth: Payer: Self-pay | Admitting: *Deleted

## 2016-03-04 VITALS — BP 147/70 | HR 77 | Temp 97.7°F | Resp 16

## 2016-03-04 VITALS — BP 144/77 | HR 80 | Temp 98.2°F | Resp 18 | Ht 70.0 in | Wt 167.3 lb

## 2016-03-04 DIAGNOSIS — C8238 Follicular lymphoma grade IIIa, lymph nodes of multiple sites: Secondary | ICD-10-CM

## 2016-03-04 DIAGNOSIS — C8230 Follicular lymphoma grade IIIa, unspecified site: Secondary | ICD-10-CM

## 2016-03-04 DIAGNOSIS — K2270 Barrett's esophagus without dysplasia: Secondary | ICD-10-CM

## 2016-03-04 DIAGNOSIS — Z95828 Presence of other vascular implants and grafts: Secondary | ICD-10-CM

## 2016-03-04 DIAGNOSIS — Z5112 Encounter for antineoplastic immunotherapy: Secondary | ICD-10-CM

## 2016-03-04 LAB — COMPREHENSIVE METABOLIC PANEL
ALT: 16 U/L (ref 0–55)
AST: 17 U/L (ref 5–34)
Albumin: 3.8 g/dL (ref 3.5–5.0)
Alkaline Phosphatase: 46 U/L (ref 40–150)
Anion Gap: 8 mEq/L (ref 3–11)
BUN: 11.7 mg/dL (ref 7.0–26.0)
CO2: 27 mEq/L (ref 22–29)
Calcium: 9.2 mg/dL (ref 8.4–10.4)
Chloride: 105 mEq/L (ref 98–109)
Creatinine: 0.9 mg/dL (ref 0.7–1.3)
EGFR: >90 mL/min/{1.73_m2} (ref >90)
Glucose: 100 mg/dl (ref 70–140)
Potassium: 4.1 mEq/L (ref 3.5–5.1)
Sodium: 140 mEq/L (ref 136–145)
Total Bilirubin: 0.4 mg/dL (ref 0.20–1.20)
Total Protein: 6.6 g/dL (ref 6.4–8.3)

## 2016-03-04 LAB — CBC WITH DIFFERENTIAL/PLATELET
BASO%: 0.5 % (ref 0.0–2.0)
Basophils Absolute: 0 10*3/uL (ref 0.0–0.1)
EOS%: 1.8 % (ref 0.0–7.0)
Eosinophils Absolute: 0.1 10*3/uL (ref 0.0–0.5)
HCT: 41.8 % (ref 38.4–49.9)
HGB: 13.8 g/dL (ref 13.0–17.1)
LYMPH%: 20.5 % (ref 14.0–49.0)
MCH: 30.9 pg (ref 27.2–33.4)
MCHC: 33.1 g/dL (ref 32.0–36.0)
MCV: 93.3 fL (ref 79.3–98.0)
MONO#: 0.4 10*3/uL (ref 0.1–0.9)
MONO%: 10.4 % (ref 0.0–14.0)
NEUT#: 2.5 10*3/uL (ref 1.5–6.5)
NEUT%: 66.8 % (ref 39.0–75.0)
Platelets: 189 10*3/uL (ref 140–400)
RBC: 4.48 10*6/uL (ref 4.20–5.82)
RDW: 13.1 % (ref 11.0–14.6)
WBC: 3.7 10*3/uL — ABNORMAL LOW (ref 4.0–10.3)
lymph#: 0.8 10*3/uL — ABNORMAL LOW (ref 0.9–3.3)

## 2016-03-04 MED ORDER — DIPHENHYDRAMINE HCL 25 MG PO CAPS
ORAL_CAPSULE | ORAL | Status: AC
  Filled 2016-03-04: qty 2

## 2016-03-04 MED ORDER — ACETAMINOPHEN 325 MG PO TABS
650.0000 mg | ORAL_TABLET | Freq: Once | ORAL | Status: AC
  Administered 2016-03-04: 650 mg via ORAL

## 2016-03-04 MED ORDER — SODIUM CHLORIDE 0.9% FLUSH
10.0000 mL | INTRAVENOUS | Status: DC | PRN
  Administered 2016-03-04: 10 mL via INTRAVENOUS
  Filled 2016-03-04: qty 10

## 2016-03-04 MED ORDER — HEPARIN SOD (PORK) LOCK FLUSH 100 UNIT/ML IV SOLN
500.0000 [IU] | Freq: Once | INTRAVENOUS | Status: AC | PRN
  Administered 2016-03-04: 500 [IU]
  Filled 2016-03-04: qty 5

## 2016-03-04 MED ORDER — RITUXIMAB CHEMO INJECTION 500 MG/50ML
375.0000 mg/m2 | Freq: Once | INTRAVENOUS | Status: AC
  Administered 2016-03-04: 700 mg via INTRAVENOUS
  Filled 2016-03-04: qty 60

## 2016-03-04 MED ORDER — ACETAMINOPHEN 325 MG PO TABS
ORAL_TABLET | ORAL | Status: AC
  Filled 2016-03-04: qty 2

## 2016-03-04 MED ORDER — DIPHENHYDRAMINE HCL 25 MG PO CAPS
50.0000 mg | ORAL_CAPSULE | Freq: Once | ORAL | Status: AC
  Administered 2016-03-04: 50 mg via ORAL

## 2016-03-04 MED ORDER — SODIUM CHLORIDE 0.9 % IV SOLN
Freq: Once | INTRAVENOUS | Status: AC
  Administered 2016-03-04: 10:00:00 via INTRAVENOUS

## 2016-03-04 MED ORDER — SODIUM CHLORIDE 0.9 % IJ SOLN
10.0000 mL | INTRAMUSCULAR | Status: DC | PRN
  Administered 2016-03-04: 10 mL
  Filled 2016-03-04: qty 10

## 2016-03-04 NOTE — Progress Notes (Signed)
[facility] OFFICE PROGRESS NOTE  Patient Care Team: Christain Sacramento, MD as PCP - General (Family Medicine) Jackolyn Confer, MD as Referring Physician (General Surgery) Heath Lark, MD as Consulting Physician (Hematology and Oncology) Carol Ada, MD as Consulting Physician (Gastroenterology)  SUMMARY OF ONCOLOGIC HISTORY: Oncology History   Follicular lymphoma grade 3a   Primary site: Lymphoid Neoplasms   Staging method: AJCC 6th Edition   Clinical free text: Bone marrow is involved on PET; Possible component of DLBCL   Clinical: Stage IV signed by Heath Lark, MD on 06/18/2014  8:05 PM   Summary: Stage IV       Follicular lymphoma grade iiia, lymph nodes of multiple sites ([place])   05/13/2014 Imaging CT scan of chest and neck showed extensive lymphadenopathy in the neck and medistinum   05/19/2014 Procedure [id number] US biopsy only showed necrotic tissue   06/03/2014 Surgery Excisional LN biopsy of right supraclavicular region showed follicular lymphoma grade 3 [id number]   06/14/2014 Imaging PET CT scan showed diffuse, bulky lymphadenopathy, splenic involvement and bone marrow disease   06/16/2014 Imaging ECHO showed preserved EF 50%   06/17/2014 Procedure He has port placement   06/20/2014 - 09/12/2014 Chemotherapy He received 5 cycles of chemotherapy with R. CHOP along with intrathecal methotrexate. He received 2 doses of IT MTX. He did not receive the final cycle of CHOP due to side effects   08/20/2014 Imaging Repeat PET/CT scan showed significant improvement with near-complete response to treatment.   10/24/2014 Imaging Repeat PET CT scan showed near complete resolution of lymphadenopathy   10/31/2014 -  Chemotherapy He received maintenance Rituxan every 60 days   03/03/2015 Imaging Repeat PET CT showed no evidence of active disease   09/29/2015 Procedure He underwent endoscopy which showed Barrett's esophagus and angiodysplasia   03/03/2016 PET scan No evidence of lymphoma.  Diffuse increase metabolic activity in the stomach. Patient had an upper endoscopy in December 2016 without evidence of malignancy. Therefore, favor findings to represent gastritis    INTERVAL HISTORY: Please see below for problem oriented charting. He returns for further follow-up. He feels well. Denies recent infection. He has occasional reflux. Denies recent lymphadenopathy. REVIEW OF SYSTEMS:   Constitutional: Denies fevers, chills or abnormal weight loss Eyes: Denies blurriness of vision Ears, nose, mouth, throat, and face: Denies mucositis or sore throat Respiratory: Denies cough, dyspnea or wheezes Cardiovascular: Denies palpitation, chest discomfort or lower extremity swelling Gastrointestinal:  Denies nausea, heartburn or change in bowel habits Skin: Denies abnormal skin rashes Lymphatics: Denies new lymphadenopathy or easy bruising Neurological:Denies numbness, tingling or new weaknesses Behavioral/Psych: Mood is stable, no new changes  All other systems were reviewed with the patient and are negative.  I have reviewed the past medical history, past surgical history, social history and family history with the patient and they are unchanged from previous note.  ALLERGIES:  has No Known Allergies.  MEDICATIONS:  Current Outpatient Prescriptions  Medication Sig Dispense Refill  . cholecalciferol (VITAMIN D) 1000 UNITS tablet Take 2,000 Units by mouth daily.     Marland Kitchen gabapentin (NEURONTIN) 300 MG capsule Take 1 capsule (300 mg total) by mouth 3 (three) times daily. 90 capsule 1  . ibuprofen (ADVIL,MOTRIN) 200 MG tablet Take 200 mg by mouth every 6 (six) hours as needed.    . lidocaine-prilocaine (EMLA) cream Apply 1 application topically as needed. 30 g 6  . lisinopril (PRINIVIL,ZESTRIL) 10 MG tablet Take 1 tablet (10 mg total) by mouth daily. 90 tablet  1  . Melatonin 5 MG TABS Take 10 mg by mouth at bedtime as needed.    Marland Kitchen omeprazole (PRILOSEC) 20 MG capsule Take 20 mg by  mouth 2 (two) times daily before a meal.     No current facility-administered medications for this visit.    PHYSICAL EXAMINATION: ECOG PERFORMANCE STATUS: 0 - Asymptomatic  Filed Vitals:   03/04/16 0838  BP: 144/77  Pulse: 80  Temp: 98.2 F (36.8 C)  Resp: 18   Filed Weights   03/04/16 0838  Weight: 167 lb 4.8 oz (75.887 kg)    GENERAL:alert, no distress and comfortable SKIN: skin color, texture, turgor are normal, no rashes or significant lesions EYES: normal, Conjunctiva are pink and non-injected, sclera clear OROPHARYNX:no exudate, no erythema and lips, buccal mucosa, and tongue normal  NECK: supple, thyroid normal size, non-tender, without nodularity LYMPH:  no palpable lymphadenopathy in the cervical, axillary or inguinal LUNGS: clear to auscultation and percussion with normal breathing effort HEART: regular rate & rhythm and no murmurs and no lower extremity edema ABDOMEN:abdomen soft, non-tender and normal bowel sounds Musculoskeletal:no cyanosis of digits and no clubbing  NEURO: alert & oriented x 3 with fluent speech, no focal motor/sensory deficits  LABORATORY DATA:  I have reviewed the data as listed    Component Value Date/Time   NA 140 03/04/2016 0802   NA 138 06/02/2014 1350   K 4.1 03/04/2016 0802   K 4.4 06/02/2014 1350   CL 98 06/02/2014 1350   CO2 27 03/04/2016 0802   CO2 28 06/02/2014 1350   GLUCOSE 100 03/04/2016 0802   GLUCOSE 85 06/02/2014 1350   BUN 11.7 03/04/2016 0802   BUN 12 06/02/2014 1350   CREATININE 0.9 03/04/2016 0802   CREATININE 0.97 06/02/2014 1350   CALCIUM 9.2 03/04/2016 0802   CALCIUM 9.5 06/02/2014 1350   PROT 6.6 03/04/2016 0802   PROT 7.3 06/02/2014 1350   ALBUMIN 3.8 03/04/2016 0802   ALBUMIN 3.9 06/02/2014 1350   AST 17 03/04/2016 0802   AST 23 06/02/2014 1350   ALT 16 03/04/2016 0802   ALT 27 06/02/2014 1350   ALKPHOS 46 03/04/2016 0802   ALKPHOS 75 06/02/2014 1350   BILITOT 0.40 03/04/2016 0802   BILITOT 0.3  06/02/2014 1350   GFRNONAA 90* 06/02/2014 1350   GFRAA >90 06/02/2014 1350    No results found for: SPEP, UPEP  Lab Results  Component Value Date   WBC 3.7* 03/04/2016   NEUTROABS 2.5 03/04/2016   HGB 13.8 03/04/2016   HCT 41.8 03/04/2016   MCV 93.3 03/04/2016   PLT 189 03/04/2016      Chemistry      Component Value Date/Time   NA 140 03/04/2016 0802   NA 138 06/02/2014 1350   K 4.1 03/04/2016 0802   K 4.4 06/02/2014 1350   CL 98 06/02/2014 1350   CO2 27 03/04/2016 0802   CO2 28 06/02/2014 1350   BUN 11.7 03/04/2016 0802   BUN 12 06/02/2014 1350   CREATININE 0.9 03/04/2016 0802   CREATININE 0.97 06/02/2014 1350      Component Value Date/Time   CALCIUM 9.2 03/04/2016 0802   CALCIUM 9.5 06/02/2014 1350   ALKPHOS 46 03/04/2016 0802   ALKPHOS 75 06/02/2014 1350   AST 17 03/04/2016 0802   AST 23 06/02/2014 1350   ALT 16 03/04/2016 0802   ALT 27 06/02/2014 1350   BILITOT 0.40 03/04/2016 0802   BILITOT 0.3 06/02/2014 1350  RADIOGRAPHIC STUDIES: I have personally reviewed the radiological images as listed and agreed with the findings in the report. Nm Pet Image Restag (ps) Skull Base To Thigh  03/03/2016  CLINICAL DATA:  Subsequent treatment strategy for lymphoma. Follicular lymphoma. EXAM: NUCLEAR MEDICINE PET SKULL BASE TO THIGH TECHNIQUE: 8.3 mCi F-18 FDG was injected intravenously. Full-ring PET imaging was performed from the skull base to thigh after the radiotracer. CT data was obtained and used for attenuation correction and anatomic localization. FASTING BLOOD GLUCOSE:  Value: 107 mg/dl COMPARISON:  PET-CT 03/03/2015, 06/14/2014 FINDINGS: NECK No hypermetabolic lymph nodes in the neck. CHEST No hypermetabolic mediastinal or hilar nodes. No suspicious pulmonary nodules on the CT scan. ABDOMEN/PELVIS No hypermetabolic abdominal or pelvic lymph nodes. Normal metabolic activity in the spleen. There is intense metabolic activity associated with the entire stomach  which is increased from prior. There is mild wall thickening within the stomach which is similar prior. There was diffuse metabolic activity within the stomach on comparison PET-CT of 03/03/2015 although less intense. SKELETON No focal hypermetabolic activity to suggest skeletal metastasis. IMPRESSION: 1. No evidence of lymphoma recurrence on FDG PET CT  scan. 2. Diffuse increase metabolic activity in the stomach. Patient had an upper endoscopy in December 2016 without evidence of malignancy. Therefore, favor findings to represent gastritis. Electronically Signed   By: Suzy Bouchard M.D.   On: 03/03/2016 10:50     ASSESSMENT & PLAN:   Follicular lymphoma grade iiia, lymph nodes of multiple sites ([place]) Clinically, he has complete response to treatment. We will continue on maintenance rituximab every other month and he agreed to proceed. PET/CT scan done May 2017 show no evidence of cancer I will proceed with treatment without delay.  Barrett esophagus  This was discovered by gastroenterologist. He is currently on proton pump inhibitor and is doing well. Recent PET CT scan showed persistent stomach inflammation We discussed dietary modification and medical adherence to proton pump inhibitor twice a day.      All questions were answered. The patient knows to call the clinic with any problems, questions or concerns. No barriers to learning was detected. I spent 15 minutes counseling the patient face to face. The total time spent in the appointment was 20 minutes and more than 50% was on counseling and review of test results     [facility], [place], MD 03/04/2016 5:05 PM

## 2016-03-04 NOTE — Patient Instructions (Signed)
[facility] Discharge Instructions for Patients Receiving Chemotherapy  Today you received the following chemotherapy agents: Rituxan   To help prevent nausea and vomiting after your treatment, we encourage you to take your nausea medication as directed.    If you develop nausea and vomiting that is not controlled by your nausea medication, call the clinic.   BELOW ARE SYMPTOMS THAT SHOULD BE REPORTED IMMEDIATELY:  *FEVER GREATER THAN 100.5 F  *CHILLS WITH OR WITHOUT FEVER  NAUSEA AND VOMITING THAT IS NOT CONTROLLED WITH YOUR NAUSEA MEDICATION  *UNUSUAL SHORTNESS OF BREATH  *UNUSUAL BRUISING OR BLEEDING  TENDERNESS IN MOUTH AND THROAT WITH OR WITHOUT PRESENCE OF ULCERS  *URINARY PROBLEMS  *BOWEL PROBLEMS  UNUSUAL RASH Items with * indicate a potential emergency and should be followed up as soon as possible.  Feel free to call the clinic you have any questions or concerns. The clinic phone number is [phone].  Please show the CHEMO ALERT CARD at check-in to the Emergency Department and triage nurse.   

## 2016-03-04 NOTE — Telephone Encounter (Signed)
Per staff message and POF I have scheduled appts. Advised scheduler of appts. JMW  

## 2016-03-04 NOTE — Telephone Encounter (Signed)
per of to sch pt appt-sent MW email to sch trmt-pt tog et updated copy b4 leaving

## 2016-03-04 NOTE — Assessment & Plan Note (Signed)
This was discovered by gastroenterologist. He is currently on proton pump inhibitor and is doing well. Recent PET CT scan showed persistent stomach inflammation We discussed dietary modification and medical adherence to proton pump inhibitor twice a day.

## 2016-03-04 NOTE — Assessment & Plan Note (Signed)
Clinically, he has complete response to treatment. We will continue on maintenance rituximab every other month and he agreed to proceed. PET/CT scan done May 2017 show no evidence of cancer I will proceed with treatment without delay.

## 2016-03-04 NOTE — Patient Instructions (Signed)

## 2016-03-05 ENCOUNTER — Other Ambulatory Visit: Payer: Self-pay | Admitting: Hematology and Oncology

## 2016-03-24 IMAGING — CT CT NECK W/ CM
3 series · 13 of 20 positions shown, 15 images · IV contrast ([ID] OMNI 300)
Comparison: Chest and C-spine CT 10/07/2011.

ADDENDUM:
Note that a chest CT has been performed today as please see
associated findings.
CLINICAL DATA: Swelling, mass/ lump right supraclavicular region.
Mediastinal mass on chest x-ray. Low-grade fever.

EXAM:
CT NECK WITH CONTRAST
TECHNIQUE: Multidetector CT imaging of the neck was performed using the
standard protocol following the bolus administration of intravenous
contrast.
CONTRAST:  100 mL Omnipaque 300 IV.

[Series 104: axial chest, neck · axial · 0.37mm/px · z∈[-211,-54]mm · 4 of 105 slices shown]
[im 21/105  bone]
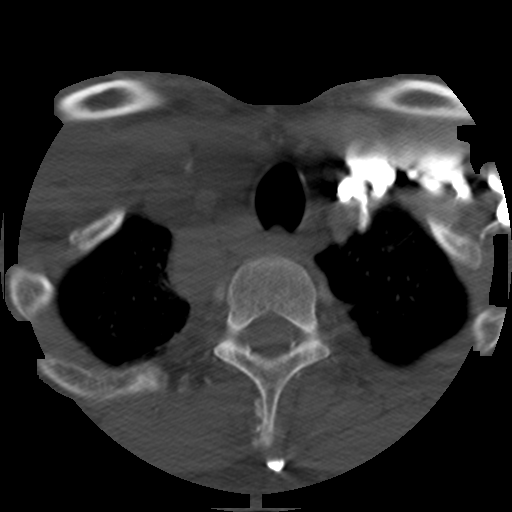
[im 42/105  bone]
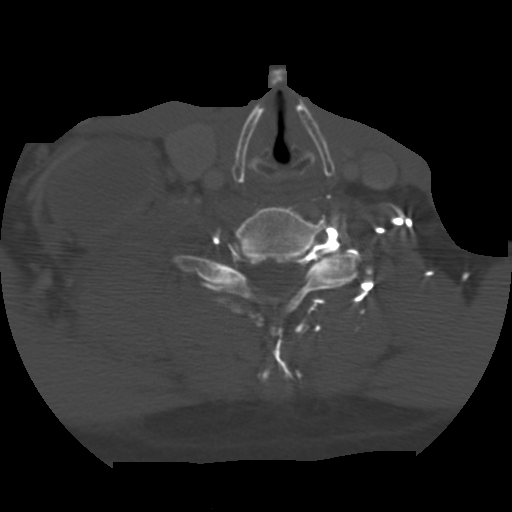
[im 63/105  bone]
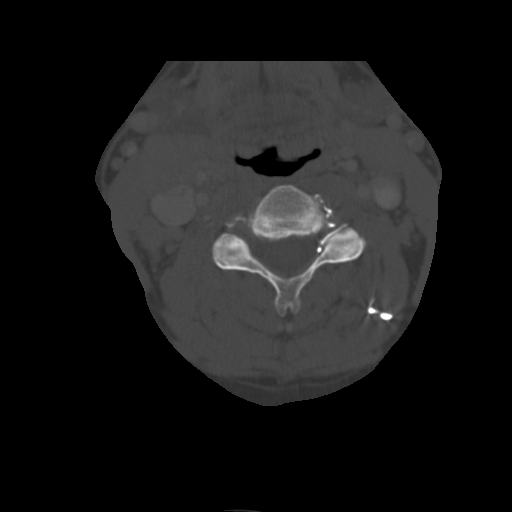
[im 84/105  bone]
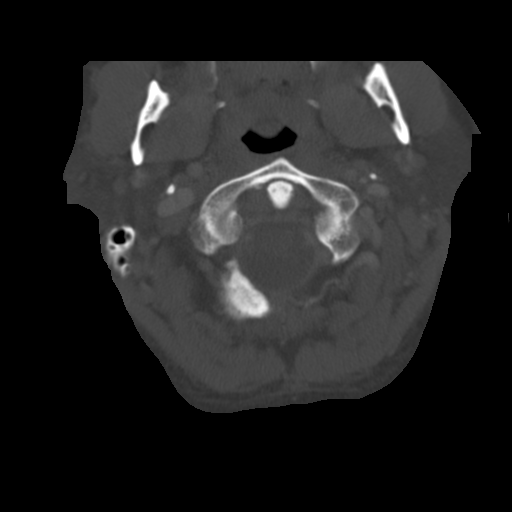

[Series 106: cor neck · coronal · 0.53mm/px · 3 of 109 slices shown]
[im 23/109  bone]
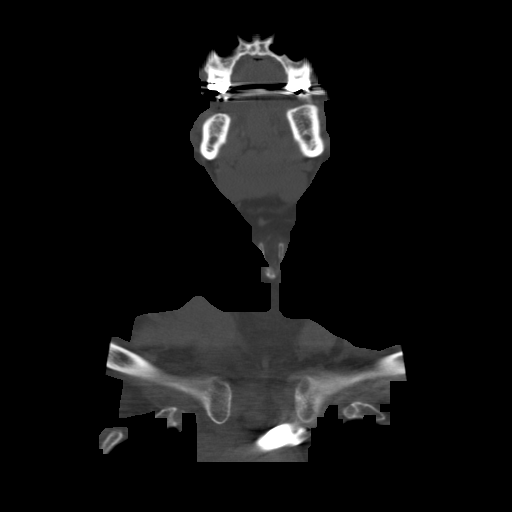
[im 44/109  bone]
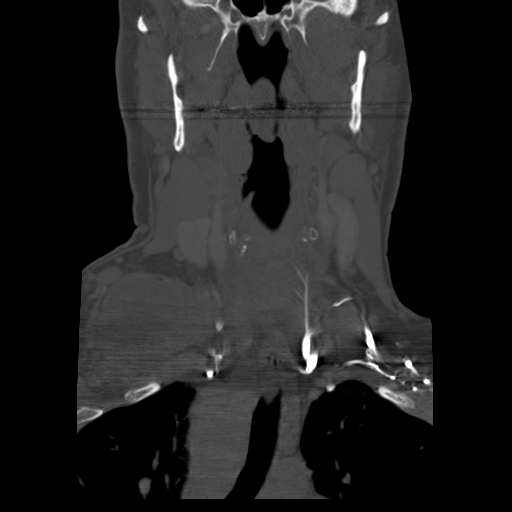
[im 65/109  bone]
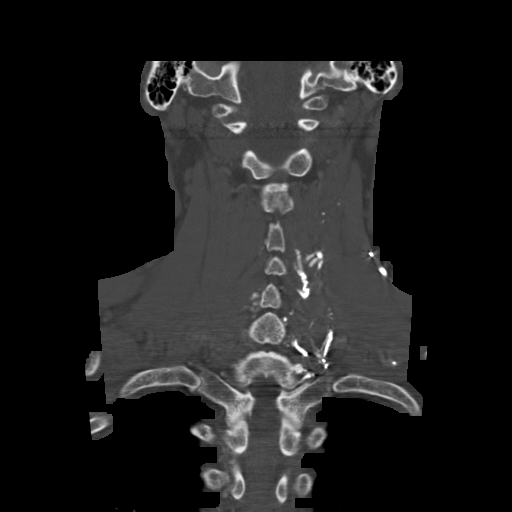

[Series 108: axial neck · axial · 0.43mm/px · z∈[-247,-57]mm · 6 of 137 slices shown, 8 images]
[im 20/137  soft-tissue]
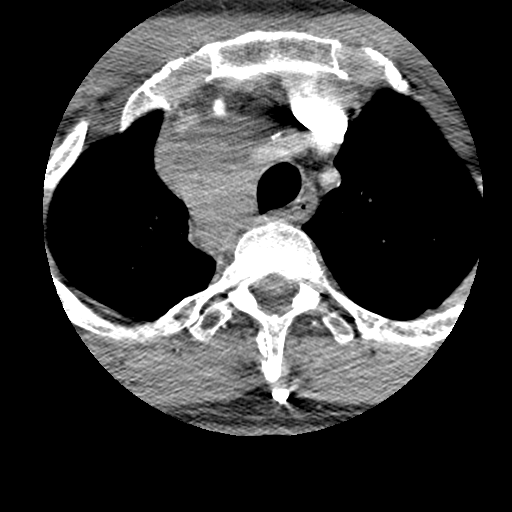
[im 20/137  bone]
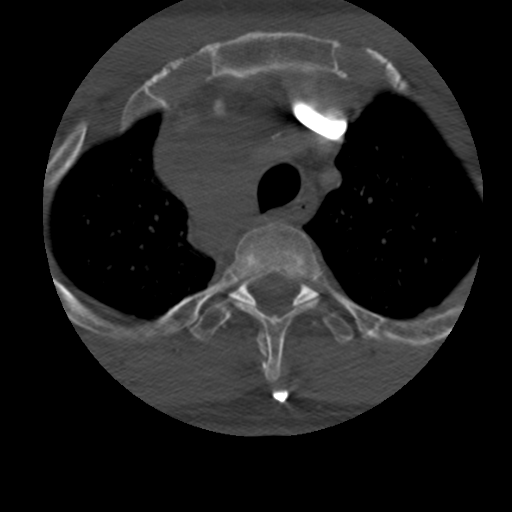
[im 39/137  bone]
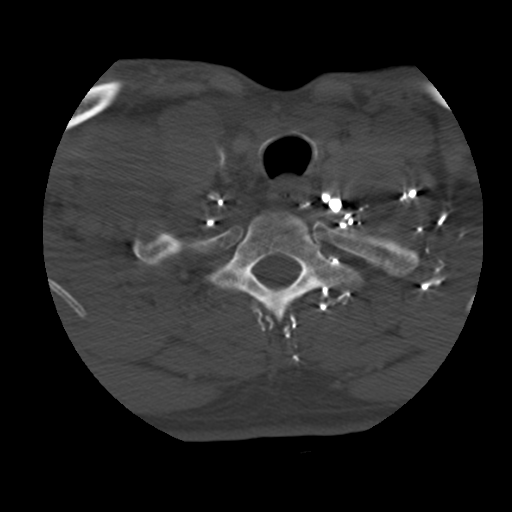
[im 59/137  bone]
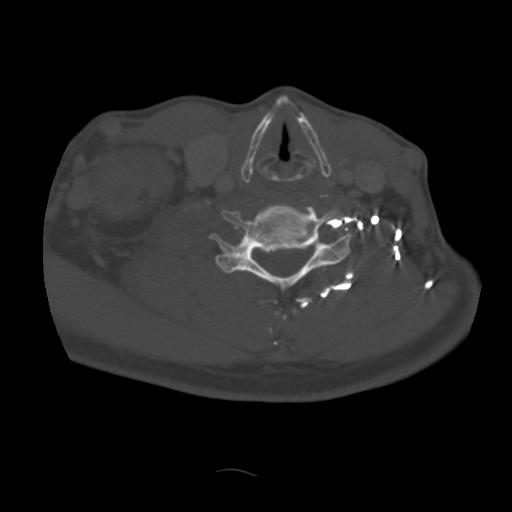
[im 78/137  bone]
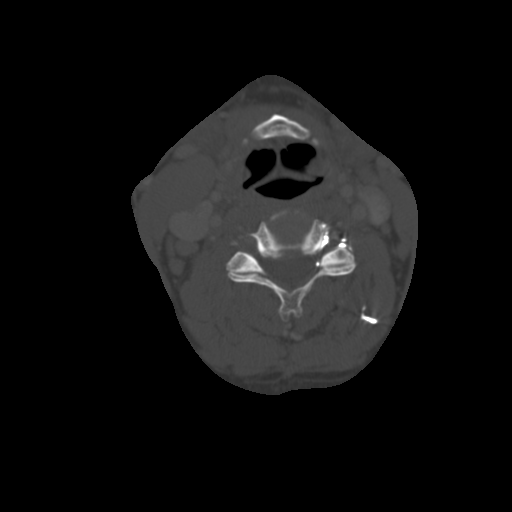
[im 98/137  soft-tissue]
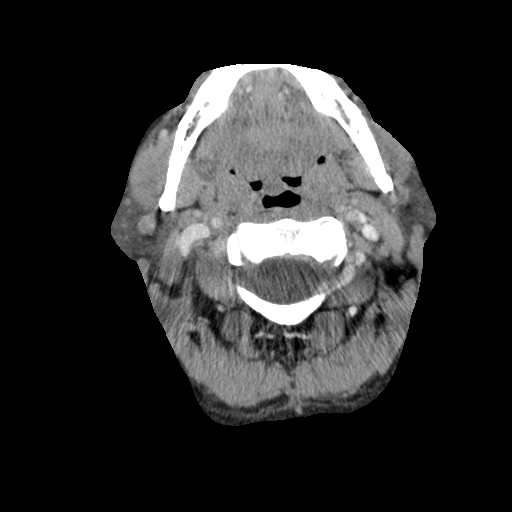
[im 98/137  bone]
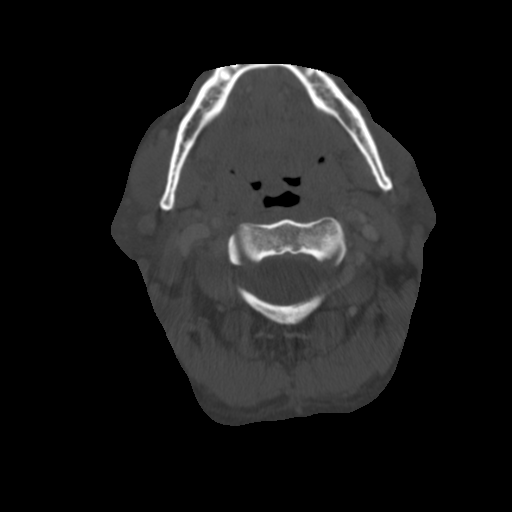
[im 117/137  bone]
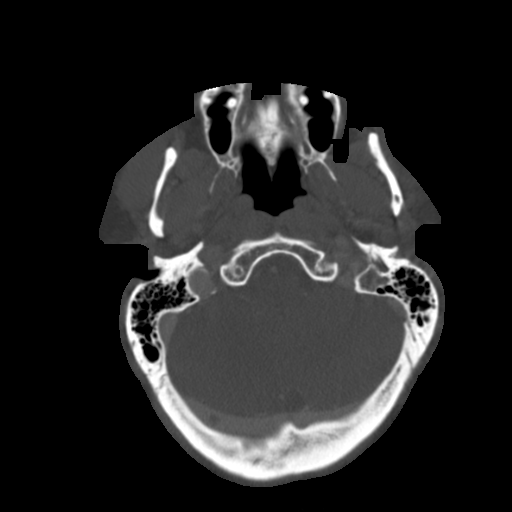

[13 of 20 positions shown; findings below may reference images not displayed]

FINDINGS: Examination demonstrates extensive adenopathy over the visualized
mediastinum and superior mediastinum with prominent right
paratracheal nodal mass measuring 4.2 x 6.4 cm in its AP and
transverse dimension and causing moderate narrowing of the left
brachiocephalic vein just proximal to its junction to the SVC. There
is bulky adenopathy over the anterior neck base at and below the
level of the thyroid. There is a large necrotic right
supraclavicular node measuring 4.1 x 5.1 cm. There is moderate
bilateral cervical chain adenopathy.

Visualized paranasal sinuses are clear. Parotid and submandibular
glands are normal and symmetric. Airway is patent. Visualized lungs
are within normal. There are mild degenerative changes of the spine.
IMPRESSION: Extensive mediastinal adenopathy incompletely evaluated on this
exam. Prominent nodal mass in the right peritracheal region
measuring 4.2 x 6.4 cm and causes moderate narrowing of the left
brachiocephalic vein just proximal to its junction to the SVC.
Extensive associated adenopathy over the neck base, cervical chains
bilaterally and right supraclavicular fossa. The large necrotic node
over the supraclavicular fossa measures 4.1 x 5.1 cm. Findings are
highly suspicious for a malignant process suggesting
lymphomatous/leukemia. Recommend chest CT for complete evaluation of
the thorax.

## 2016-03-24 IMAGING — CT CT HEAD WO/W CM
1 series · 15 of 30 positions shown, 19 images · IV contrast (omnipaque)
Comparison: Cervical spine CT 10/07/2011.

CLINICAL DATA: 58-year-old male with recently discovered
supraclavicular mass, evidence of mediastinal mass on recent chest
radiographs. Low grade fever nonproductive cough. Initial encounter.

EXAM:
CT HEAD WITHOUT AND WITH CONTRAST
TECHNIQUE: Contiguous axial images were obtained from the base of the skull
through the vertex without and with intravenous contrast
CONTRAST:  100 mL Omnipaque 300 in conjunction with contrast
enhanced imaging of the neck and chest reported separately.

[Series 2: head w/o · axial · non-contrast · 0.49mm/px · z∈[+31,+177]mm · 15 of 32 slices shown, 19 images]
[im 2/32  brain]
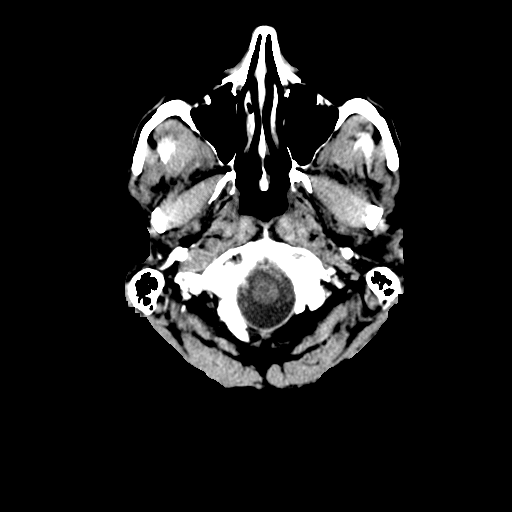
[im 2/32  bone]
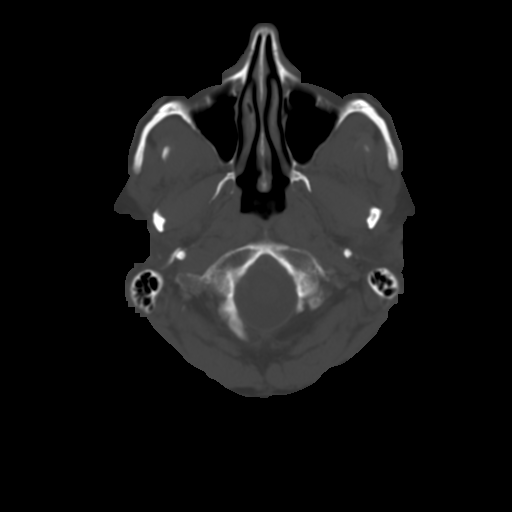
[im 4/32  brain]
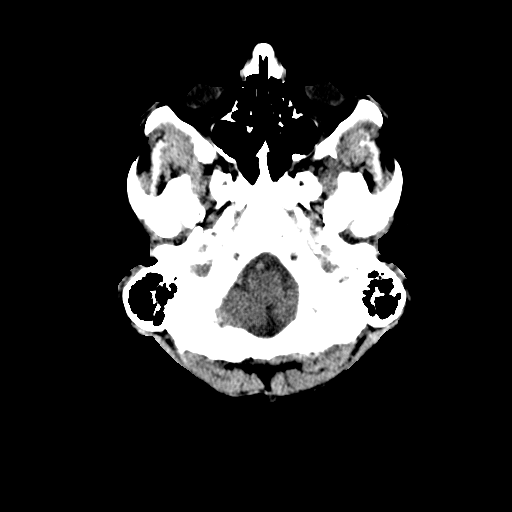
[im 6/32  brain]
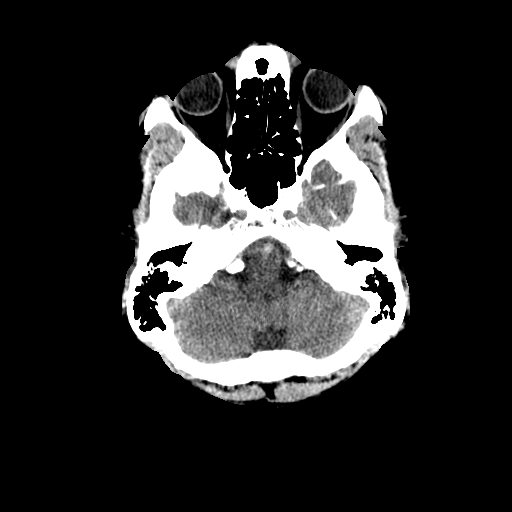
[im 8/32  brain]
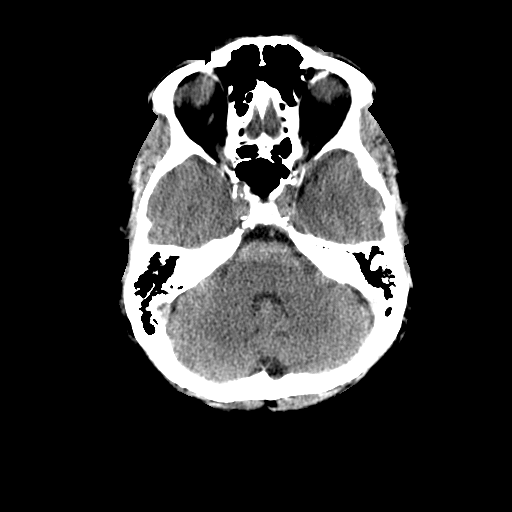
[im 10/32  brain]
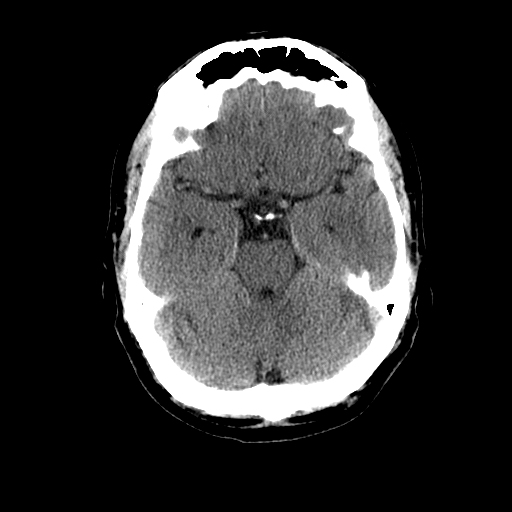
[im 10/32  bone]
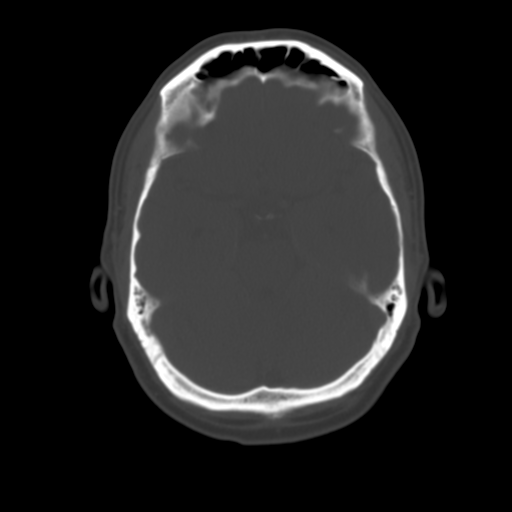
[im 12/32  brain]
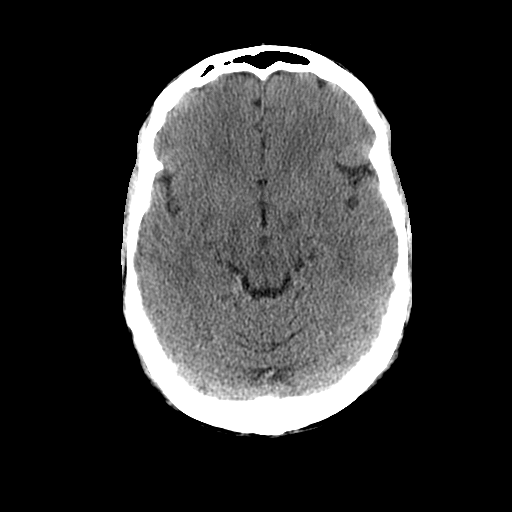
[im 14/32  brain]
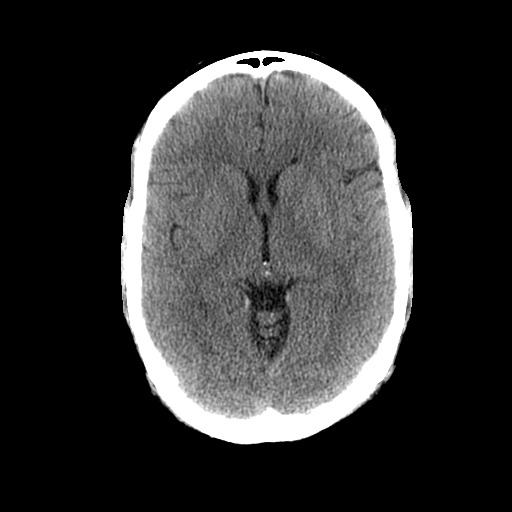
[im 17/32  brain]
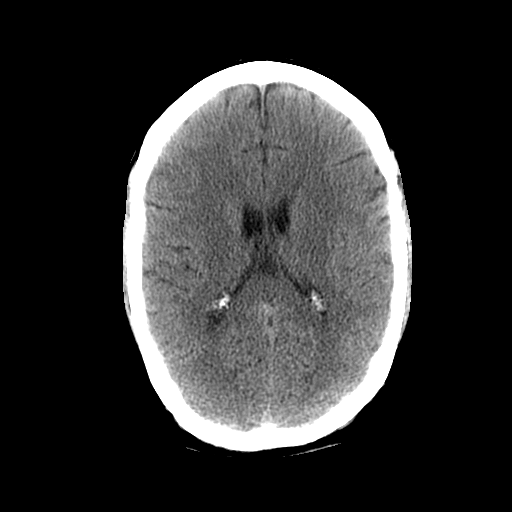
[im 18/32  brain]
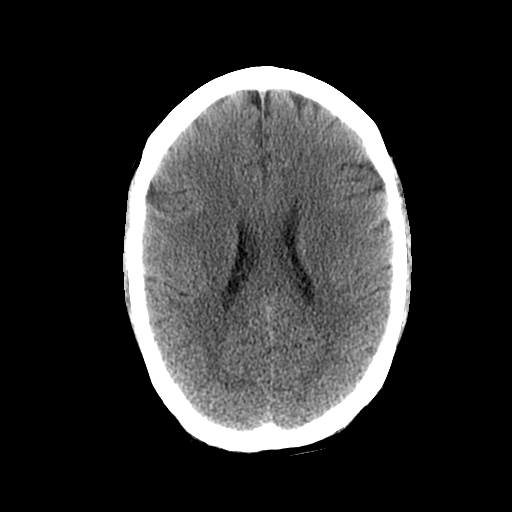
[im 18/32  bone]
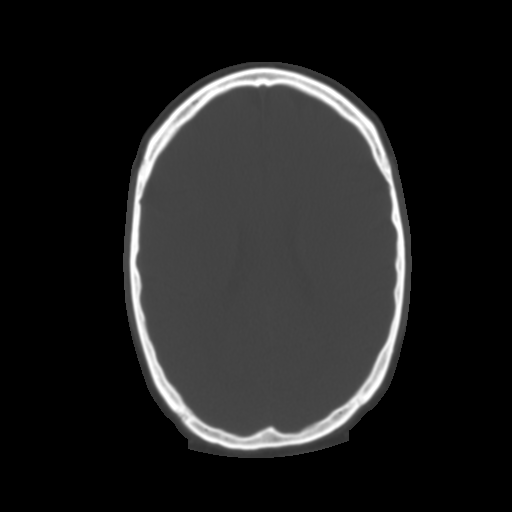
[im 20/32  brain]
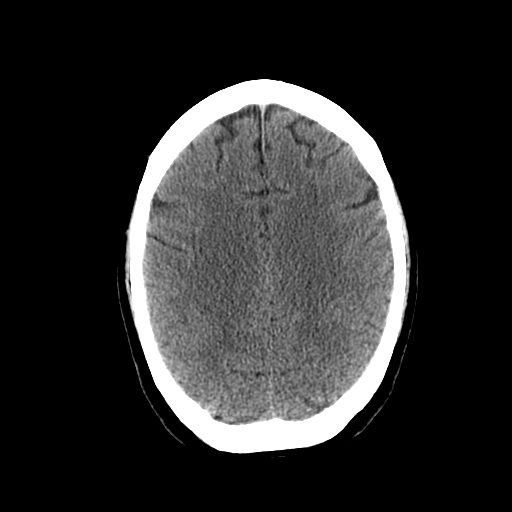
[im 22/32  brain]
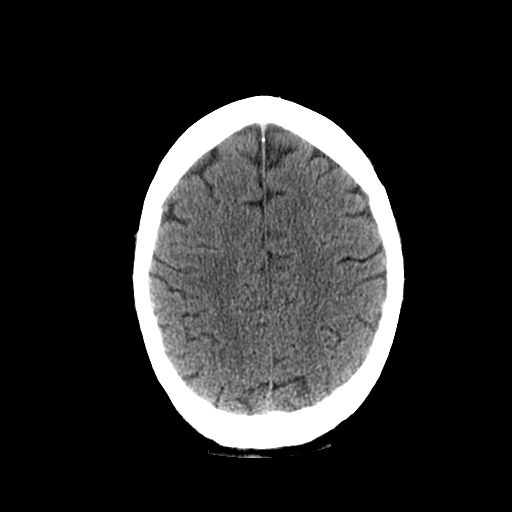
[im 24/32  brain]
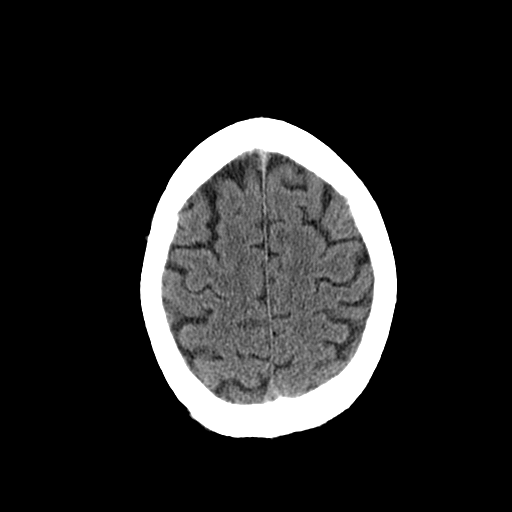
[im 26/32  brain]
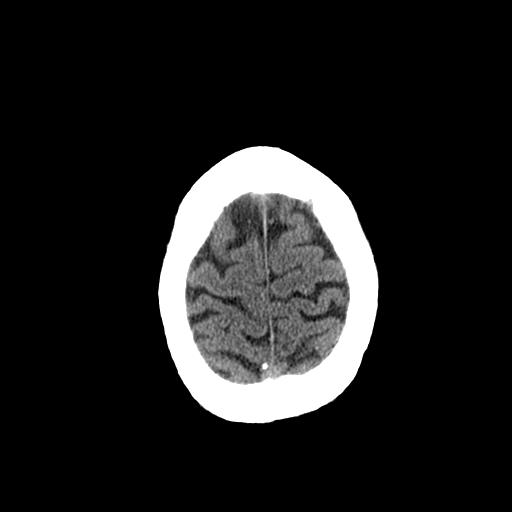
[im 26/32  bone]
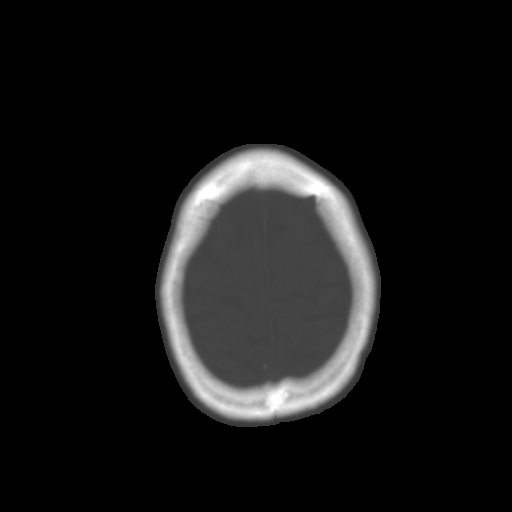
[im 28/32  brain]
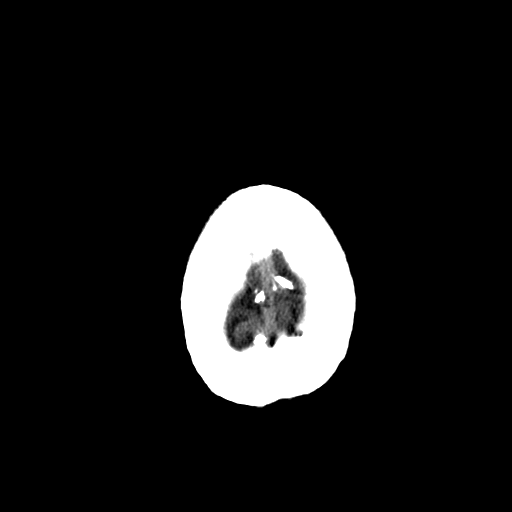
[im 30/32  brain]
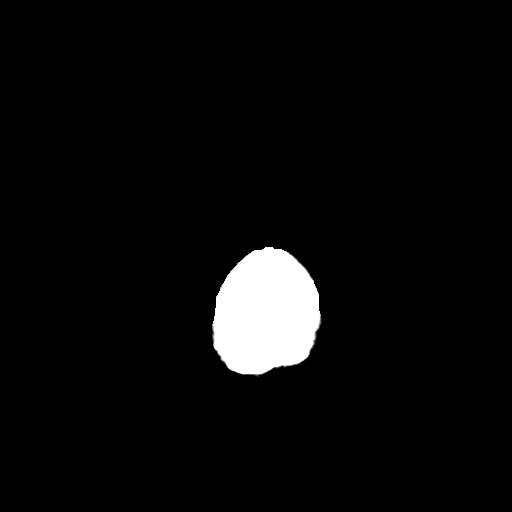

[15 of 30 positions shown; findings below may reference images not displayed]

FINDINGS: Visualized paranasal sinuses and mastoids are clear. No acute or
suspicious skull lesion identified. Visualized orbits and scalp soft
tissues are within normal limits.

Mild Calcified atherosclerosis at the skull base. No midline shift,
ventriculomegaly, mass effect, evidence of mass lesion, intracranial
hemorrhage or evidence of cortically based acute infarction.
Gray-white matter differentiation is within normal limits throughout
the brain. No abnormal enhancement identified. Major intracranial
vascular structures are enhancing.
IMPRESSION: 1.  Normal CT appearance of the brain.
2. See neck and chest CT findings from today reported separately.

## 2016-03-30 IMAGING — US US BIOPSY
1 series · 8 of 8 positions shown · non-contrast
Comparison: none

CLINICAL DATA: Right supraclavicular lymph node mass and contiguous
mediastinal adenopathy.

[Series 1: us biopsy · 0.07mm/px · 8 of 8 slices shown]
[im 1/8]
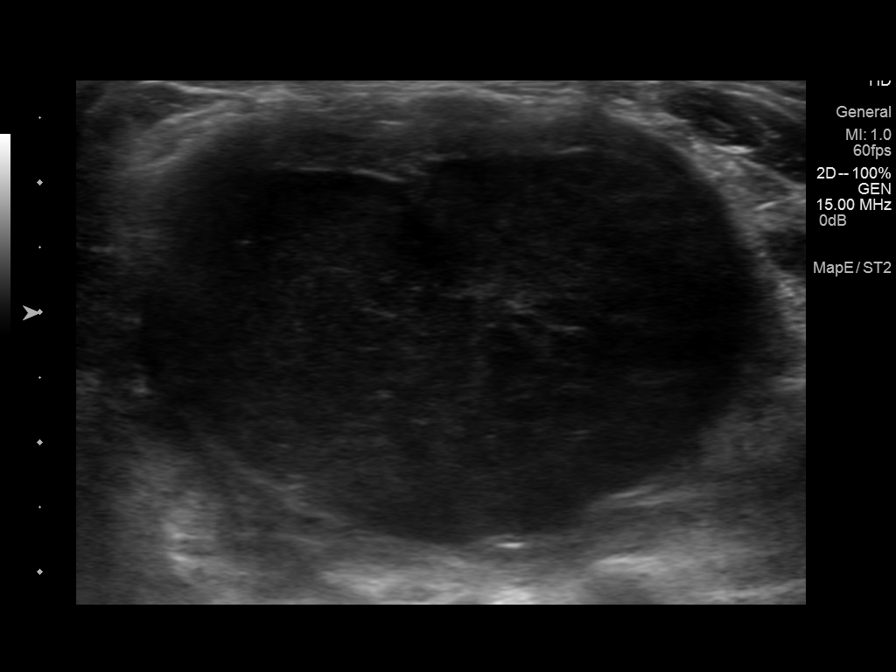
[im 2/8]
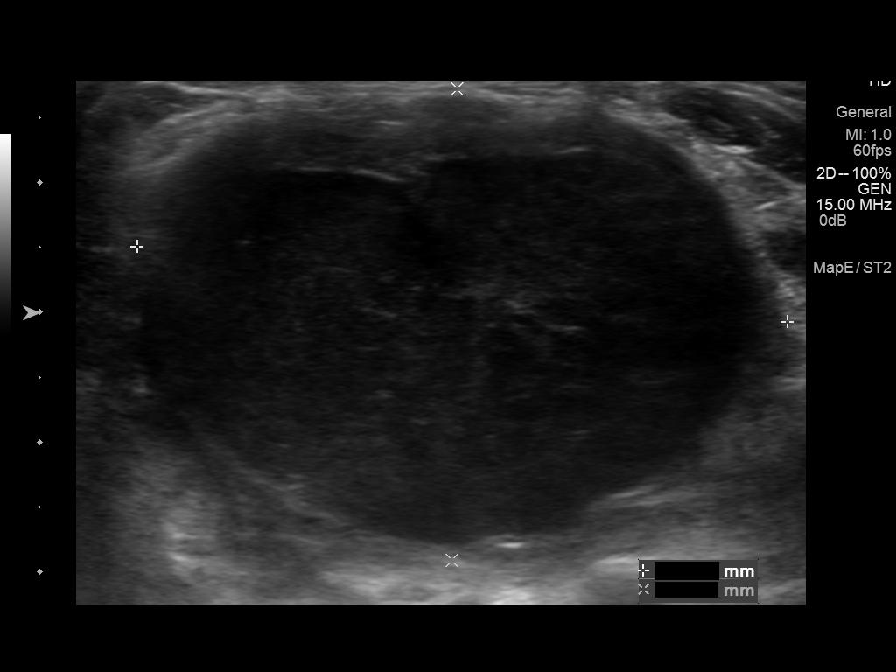
[im 3/8]
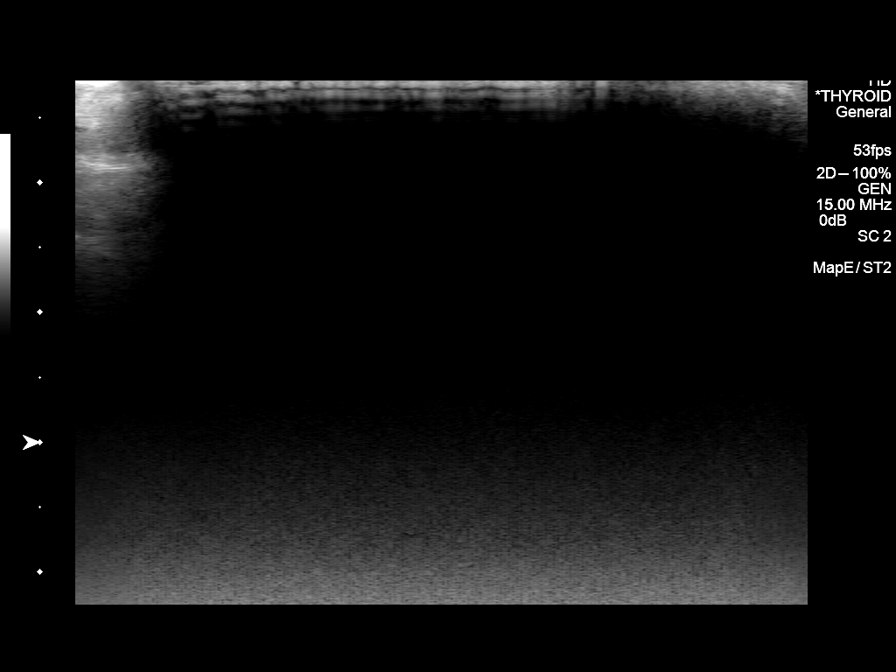
[im 4/8]
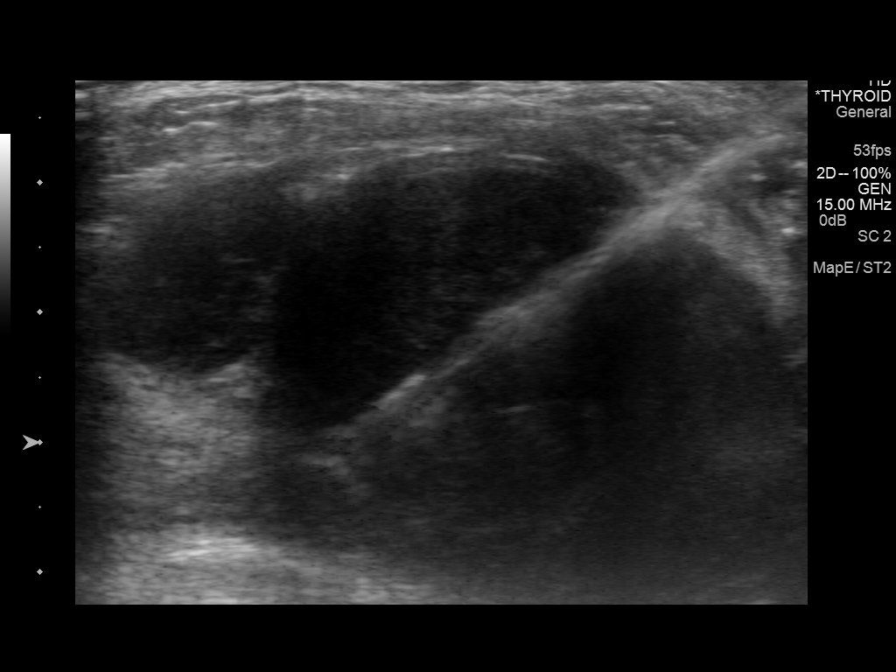
[im 5/8]
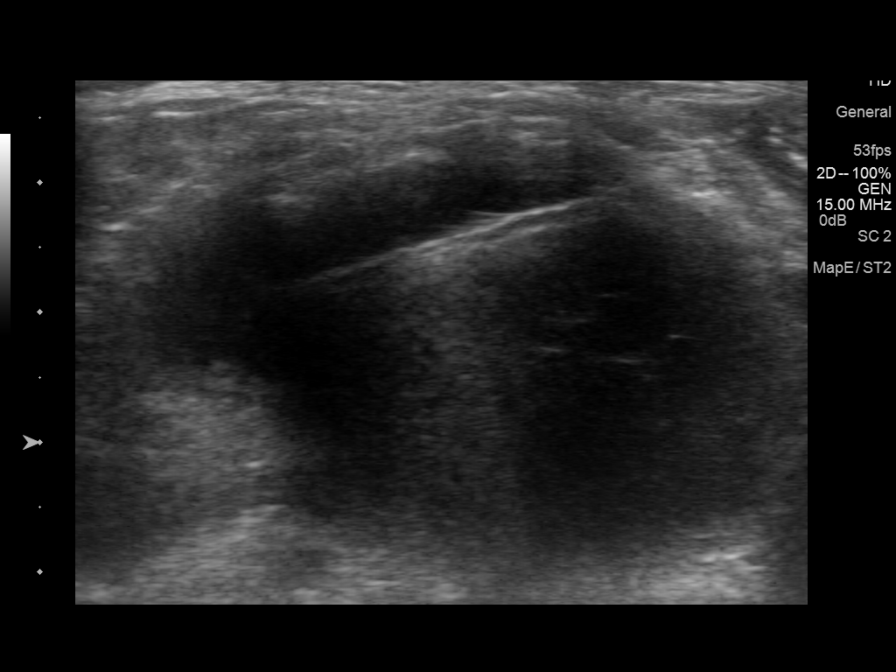
[im 6/8]
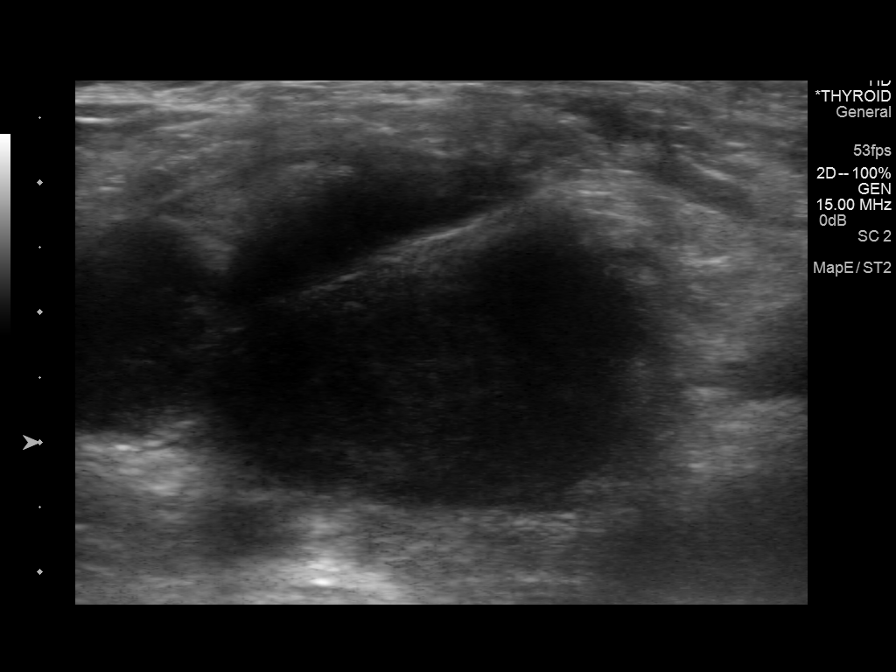
[im 7/8]
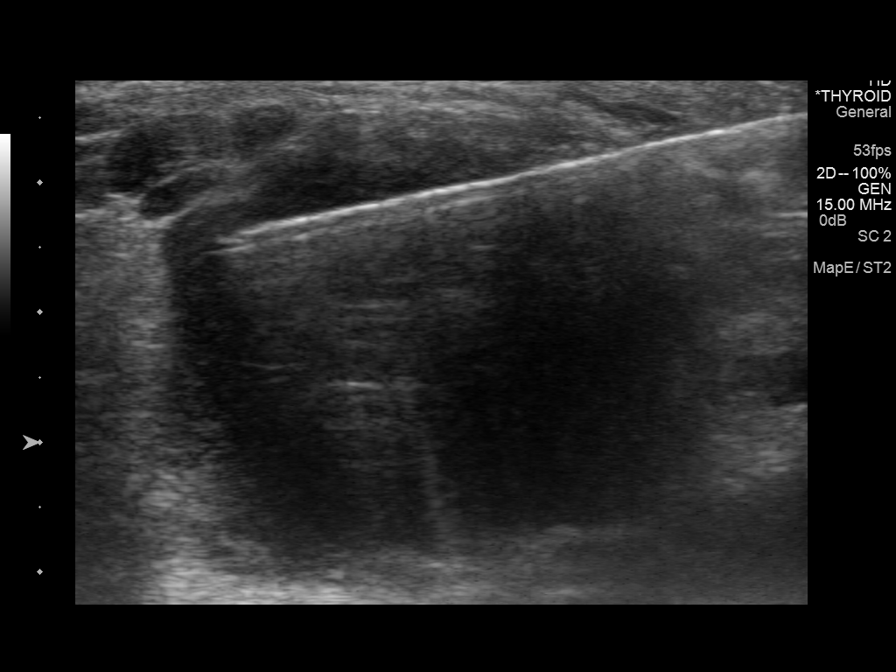
[im 8/8]
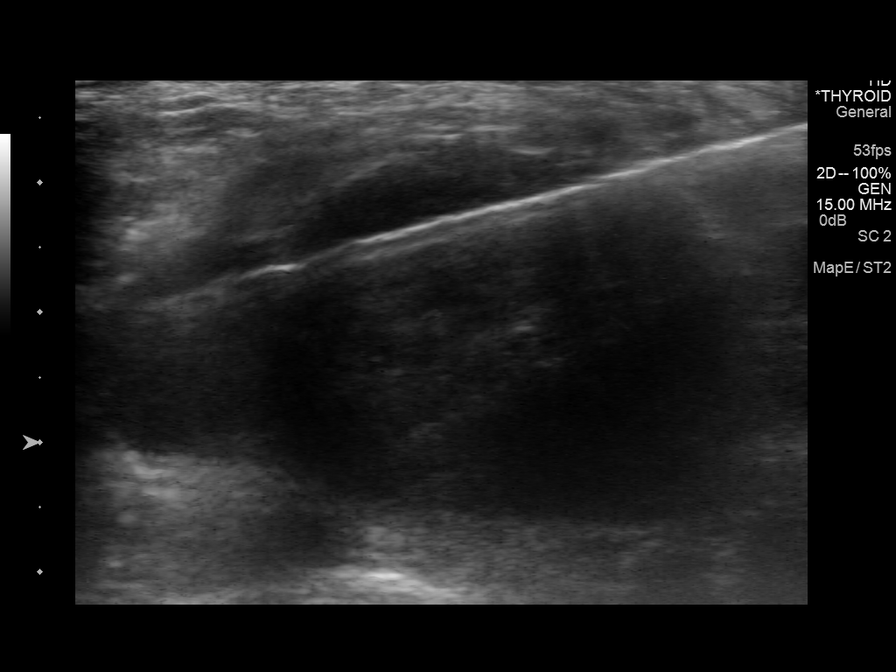

[8 of 8 positions shown; findings below may reference images not displayed]

EXAM:
ULTRASOUND GUIDED CORE BIOPSY OF RIGHT SUPRACLAVICULAR LYMPH NODE
MASS

MEDICATIONS:
1.0 mg IV Versed; 50 mcg IV Fentanyl

Total Moderate Sedation Time: 12 min

PROCEDURE:
The procedure, risks, benefits, and alternatives were explained to
the patient. Questions regarding the procedure were encouraged and
answered. The patient understands and consents to the procedure.

The right neck was prepped with Betadine in a sterile fashion, and a
sterile drape was applied covering the operative field. A sterile
gown and sterile gloves were used for the procedure. Local
anesthesia was provided with 1% Lidocaine.

Right supraclavicular mass was localized. A total of six 18 gauge
core biopsy samples were obtained and submitted in saline.

COMPLICATIONS:
None.
FINDINGS: Right supraclavicular mass measures approximately 5 cm in transverse
diameter. Solid tissue was obtained.
IMPRESSION: Ultrasound-guided core biopsy performed of right supraclavicular
lymph node mass.

## 2016-05-06 ENCOUNTER — Encounter: Payer: Self-pay | Admitting: Hematology and Oncology

## 2016-05-06 ENCOUNTER — Ambulatory Visit: Payer: BLUE CROSS/BLUE SHIELD

## 2016-05-06 ENCOUNTER — Telehealth: Payer: Self-pay | Admitting: Hematology and Oncology

## 2016-05-06 ENCOUNTER — Ambulatory Visit (HOSPITAL_BASED_OUTPATIENT_CLINIC_OR_DEPARTMENT_OTHER): Payer: BLUE CROSS/BLUE SHIELD | Admitting: Hematology and Oncology

## 2016-05-06 ENCOUNTER — Other Ambulatory Visit (HOSPITAL_BASED_OUTPATIENT_CLINIC_OR_DEPARTMENT_OTHER): Payer: BLUE CROSS/BLUE SHIELD

## 2016-05-06 ENCOUNTER — Ambulatory Visit (HOSPITAL_BASED_OUTPATIENT_CLINIC_OR_DEPARTMENT_OTHER): Payer: BLUE CROSS/BLUE SHIELD

## 2016-05-06 VITALS — BP 115/76 | HR 82 | Temp 98.2°F | Resp 16

## 2016-05-06 VITALS — BP 115/73 | HR 74 | Temp 98.2°F | Resp 18 | Wt 167.3 lb

## 2016-05-06 DIAGNOSIS — K2270 Barrett's esophagus without dysplasia: Secondary | ICD-10-CM | POA: Diagnosis not present

## 2016-05-06 DIAGNOSIS — C8238 Follicular lymphoma grade IIIa, lymph nodes of multiple sites: Secondary | ICD-10-CM | POA: Diagnosis not present

## 2016-05-06 DIAGNOSIS — Z5112 Encounter for antineoplastic immunotherapy: Secondary | ICD-10-CM | POA: Diagnosis not present

## 2016-05-06 DIAGNOSIS — Z452 Encounter for adjustment and management of vascular access device: Secondary | ICD-10-CM | POA: Diagnosis not present

## 2016-05-06 DIAGNOSIS — C8230 Follicular lymphoma grade IIIa, unspecified site: Secondary | ICD-10-CM

## 2016-05-06 LAB — COMPREHENSIVE METABOLIC PANEL
ALT: 16 U/L (ref 0–55)
AST: 16 U/L (ref 5–34)
Albumin: 3.9 g/dL (ref 3.5–5.0)
Alkaline Phosphatase: 58 U/L (ref 40–150)
Anion Gap: 8 mEq/L (ref 3–11)
BUN: 16.1 mg/dL (ref 7.0–26.0)
CO2: 27 mEq/L (ref 22–29)
Calcium: 9.4 mg/dL (ref 8.4–10.4)
Chloride: 103 mEq/L (ref 98–109)
Creatinine: 0.9 mg/dL (ref 0.7–1.3)
EGFR: >90 mL/min/{1.73_m2} (ref >90)
Glucose: 95 mg/dl (ref 70–140)
Potassium: 4.4 mEq/L (ref 3.5–5.1)
Sodium: 138 mEq/L (ref 136–145)
Total Bilirubin: 0.54 mg/dL (ref 0.20–1.20)
Total Protein: 6.8 g/dL (ref 6.4–8.3)

## 2016-05-06 LAB — CBC WITH DIFFERENTIAL/PLATELET
BASO%: 0.6 % (ref 0.0–2.0)
Basophils Absolute: 0 10*3/uL (ref 0.0–0.1)
EOS%: 1.3 % (ref 0.0–7.0)
Eosinophils Absolute: 0.1 10*3/uL (ref 0.0–0.5)
HCT: 41.9 % (ref 38.4–49.9)
HGB: 13.8 g/dL (ref 13.0–17.1)
LYMPH%: 18.6 % (ref 14.0–49.0)
MCH: 30.2 pg (ref 27.2–33.4)
MCHC: 32.9 g/dL (ref 32.0–36.0)
MCV: 91.8 fL (ref 79.3–98.0)
MONO#: 0.5 10*3/uL (ref 0.1–0.9)
MONO%: 11.2 % (ref 0.0–14.0)
NEUT#: 3 10*3/uL (ref 1.5–6.5)
NEUT%: 68.3 % (ref 39.0–75.0)
Platelets: 234 10*3/uL (ref 140–400)
RBC: 4.57 10*6/uL (ref 4.20–5.82)
RDW: 12.6 % (ref 11.0–14.6)
WBC: 4.4 10*3/uL (ref 4.0–10.3)
lymph#: 0.8 10*3/uL — ABNORMAL LOW (ref 0.9–3.3)

## 2016-05-06 MED ORDER — ALTEPLASE 2 MG IJ SOLR
2.0000 mg | Freq: Once | INTRAMUSCULAR | Status: AC
  Administered 2016-05-06: 2 mg
  Filled 2016-05-06: qty 2

## 2016-05-06 MED ORDER — HEPARIN SOD (PORK) LOCK FLUSH 100 UNIT/ML IV SOLN
500.0000 [IU] | Freq: Once | INTRAVENOUS | Status: AC | PRN
  Administered 2016-05-06: 500 [IU]
  Filled 2016-05-06: qty 5

## 2016-05-06 MED ORDER — SODIUM CHLORIDE 0.9 % IJ SOLN
10.0000 mL | INTRAMUSCULAR | Status: DC | PRN
  Administered 2016-05-06: 10 mL
  Filled 2016-05-06: qty 10

## 2016-05-06 MED ORDER — SODIUM CHLORIDE 0.9 % IV SOLN
Freq: Once | INTRAVENOUS | Status: AC
  Administered 2016-05-06: 10:00:00 via INTRAVENOUS

## 2016-05-06 MED ORDER — ACETAMINOPHEN 325 MG PO TABS
650.0000 mg | ORAL_TABLET | Freq: Once | ORAL | Status: AC
  Administered 2016-05-06: 650 mg via ORAL

## 2016-05-06 MED ORDER — SODIUM CHLORIDE 0.9 % IJ SOLN
10.0000 mL | INTRAMUSCULAR | Status: AC | PRN
  Administered 2016-05-06: 10 mL
  Filled 2016-05-06: qty 10

## 2016-05-06 MED ORDER — DIPHENHYDRAMINE HCL 25 MG PO CAPS
ORAL_CAPSULE | ORAL | Status: AC
  Filled 2016-05-06: qty 1

## 2016-05-06 MED ORDER — ACETAMINOPHEN 325 MG PO TABS
ORAL_TABLET | ORAL | Status: AC
  Filled 2016-05-06: qty 2

## 2016-05-06 MED ORDER — RITUXIMAB CHEMO INJECTION 500 MG/50ML
375.0000 mg/m2 | Freq: Once | INTRAVENOUS | Status: AC
  Administered 2016-05-06: 700 mg via INTRAVENOUS
  Filled 2016-05-06: qty 50

## 2016-05-06 MED ORDER — DIPHENHYDRAMINE HCL 25 MG PO CAPS
50.0000 mg | ORAL_CAPSULE | Freq: Once | ORAL | Status: AC
  Administered 2016-05-06: 50 mg via ORAL

## 2016-05-06 NOTE — Telephone Encounter (Signed)
Pt will p/u sched in tx room °

## 2016-05-06 NOTE — Assessment & Plan Note (Signed)
This was discovered by gastroenterologist. He is currently on proton pump inhibitor and is doing well. Recent PET CT scan showed persistent stomach inflammation We discussed dietary modification and medical adherence to proton pump inhibitor twice a day.

## 2016-05-06 NOTE — Patient Instructions (Signed)
[facility] Discharge Instructions for Patients Receiving Chemotherapy  Today you received the following chemotherapy agents Rituxan To help prevent nausea and vomiting after your treatment, we encourage you to take your nausea medication as prescribed.  If you develop nausea and vomiting that is not controlled by your nausea medication, call the clinic.   BELOW ARE SYMPTOMS THAT SHOULD BE REPORTED IMMEDIATELY:  *FEVER GREATER THAN 100.5 F  *CHILLS WITH OR WITHOUT FEVER  NAUSEA AND VOMITING THAT IS NOT CONTROLLED WITH YOUR NAUSEA MEDICATION  *UNUSUAL SHORTNESS OF BREATH  *UNUSUAL BRUISING OR BLEEDING  TENDERNESS IN MOUTH AND THROAT WITH OR WITHOUT PRESENCE OF ULCERS  *URINARY PROBLEMS  *BOWEL PROBLEMS  UNUSUAL RASH Items with * indicate a potential emergency and should be followed up as soon as possible.  Feel free to call the clinic you have any questions or concerns. The clinic phone number is [phone].  Please show the CHEMO ALERT CARD at check-in to the Emergency Department and triage nurse.   

## 2016-05-06 NOTE — Assessment & Plan Note (Signed)
Clinically, he has complete response to treatment. We will continue on maintenance rituximab every other month and he agreed to proceed. PET/CT scan done May 2017 show no evidence of cancer I will proceed with treatment without delay.

## 2016-05-06 NOTE — Progress Notes (Signed)
[place] OFFICE PROGRESS NOTE  Patient Care Team: Christain Sacramento, MD as PCP - General (Family Medicine) Jackolyn Confer, MD as Referring Physician (General Surgery) Heath Lark, MD as Consulting Physician (Hematology and Oncology) Carol Ada, MD as Consulting Physician (Gastroenterology)  SUMMARY OF ONCOLOGIC HISTORY: Oncology History   Follicular lymphoma grade 3a   Primary site: Lymphoid Neoplasms   Staging method: AJCC 6th Edition   Clinical free text: Bone marrow is involved on PET; Possible component of DLBCL   Clinical: Stage IV signed by Heath Lark, MD on 06/18/2014  8:05 PM   Summary: Stage IV       Follicular lymphoma grade iiia, lymph nodes of multiple sites ([place])   05/13/2014 Imaging    CT scan of chest and neck showed extensive lymphadenopathy in the neck and medistinum     05/19/2014 Procedure    [id number] US biopsy only showed necrotic tissue     06/03/2014 Surgery    Excisional LN biopsy of right supraclavicular region showed follicular lymphoma grade 3 [id number]     06/14/2014 Imaging    PET CT scan showed diffuse, bulky lymphadenopathy, splenic involvement and bone marrow disease     06/16/2014 Imaging    ECHO showed preserved EF 50%     06/17/2014 Procedure    He has port placement     06/20/2014 - 09/12/2014 Chemotherapy    He received 5 cycles of chemotherapy with R. CHOP along with intrathecal methotrexate. He received 2 doses of IT MTX. He did not receive the final cycle of CHOP due to side effects     08/20/2014 Imaging    Repeat PET/CT scan showed significant improvement with near-complete response to treatment.     10/24/2014 Imaging    Repeat PET CT scan showed near complete resolution of lymphadenopathy     10/31/2014 -  Chemotherapy    He received maintenance Rituxan every 60 days     03/03/2015 Imaging    Repeat PET CT showed no evidence of active disease     09/29/2015 Procedure    He underwent endoscopy which showed  Barrett's esophagus and angiodysplasia     03/03/2016 PET scan    No evidence of lymphoma. Diffuse increase metabolic activity in the stomach. Patient had an upper endoscopy in December 2016 without evidence of malignancy. Therefore, favor findings to represent gastritis      INTERVAL HISTORY: Please see below for problem oriented charting. He is seen prior to cycle 10 of chemotherapy. Denies worsening reflux disease. No recent infection. No new lymphadenopathy  REVIEW OF SYSTEMS:   Constitutional: Denies fevers, chills or abnormal weight loss Eyes: Denies blurriness of vision Ears, nose, mouth, throat, and face: Denies mucositis or sore throat Respiratory: Denies cough, dyspnea or wheezes Cardiovascular: Denies palpitation, chest discomfort or lower extremity swelling Gastrointestinal:  Denies nausea, heartburn or change in bowel habits Skin: Denies abnormal skin rashes Lymphatics: Denies new lymphadenopathy or easy bruising Neurological:Denies numbness, tingling or new weaknesses Behavioral/Psych: Mood is stable, no new changes  All other systems were reviewed with the patient and are negative.  I have reviewed the past medical history, past surgical history, social history and family history with the patient and they are unchanged from previous note.  ALLERGIES:  has No Known Allergies.  MEDICATIONS:  Current Outpatient Prescriptions  Medication Sig Dispense Refill  . cholecalciferol (VITAMIN D) 1000 UNITS tablet Take 2,000 Units by mouth daily.     Marland Kitchen ibuprofen (ADVIL,MOTRIN) 200 MG tablet  Take 200 mg by mouth every 6 (six) hours as needed.    . lidocaine-prilocaine (EMLA) cream Apply 1 application topically as needed. 30 g 6  . lisinopril (PRINIVIL,ZESTRIL) 10 MG tablet TAKE 1 TABLET (10 MG TOTAL) BY MOUTH DAILY. 90 tablet 1  . Melatonin 5 MG TABS Take 10 mg by mouth at bedtime as needed.    Marland Kitchen omeprazole (PRILOSEC) 20 MG capsule Take 20 mg by mouth 2 (two) times daily before  a meal.     No current facility-administered medications for this visit.     PHYSICAL EXAMINATION: ECOG PERFORMANCE STATUS: 0 - Asymptomatic  Vitals:   05/06/16 0905  BP: 115/73  Pulse: 74  Resp: 18  Temp: 98.2 F (36.8 C)   Filed Weights   05/06/16 0905  Weight: 167 lb 4.8 oz (75.9 kg)    GENERAL:alert, no distress and comfortable SKIN: skin color, texture, turgor are normal, no rashes or significant lesions EYES: normal, Conjunctiva are pink and non-injected, sclera clear OROPHARYNX:no exudate, no erythema and lips, buccal mucosa, and tongue normal  NECK: supple, thyroid normal size, non-tender, without nodularity LYMPH:  no palpable lymphadenopathy in the cervical, axillary or inguinal LUNGS: clear to auscultation and percussion with normal breathing effort HEART: regular rate & rhythm and no murmurs and no lower extremity edema ABDOMEN:abdomen soft, non-tender and normal bowel sounds Musculoskeletal:no cyanosis of digits and no clubbing  NEURO: alert & oriented x 3 with fluent speech, no focal motor/sensory deficits  LABORATORY DATA:  I have reviewed the data as listed    Component Value Date/Time   NA 138 05/06/2016 0828   K 4.4 05/06/2016 0828   CL 98 06/02/2014 1350   CO2 27 05/06/2016 0828   GLUCOSE 95 05/06/2016 0828   BUN 16.1 05/06/2016 0828   CREATININE 0.9 05/06/2016 0828   CALCIUM 9.4 05/06/2016 0828   PROT 6.8 05/06/2016 0828   ALBUMIN 3.9 05/06/2016 0828   AST 16 05/06/2016 0828   ALT 16 05/06/2016 0828   ALKPHOS 58 05/06/2016 0828   BILITOT 0.54 05/06/2016 0828   GFRNONAA 90 (L) 06/02/2014 1350   GFRAA >90 06/02/2014 1350    No results found for: SPEP, UPEP  Lab Results  Component Value Date   WBC 4.4 05/06/2016   NEUTROABS 3.0 05/06/2016   HGB 13.8 05/06/2016   HCT 41.9 05/06/2016   MCV 91.8 05/06/2016   PLT 234 05/06/2016      Chemistry      Component Value Date/Time   NA 138 05/06/2016 0828   K 4.4 05/06/2016 0828   CL 98  06/02/2014 1350   CO2 27 05/06/2016 0828   BUN 16.1 05/06/2016 0828   CREATININE 0.9 05/06/2016 0828      Component Value Date/Time   CALCIUM 9.4 05/06/2016 0828   ALKPHOS 58 05/06/2016 0828   AST 16 05/06/2016 0828   ALT 16 05/06/2016 0828   BILITOT 0.54 05/06/2016 0828       ASSESSMENT & PLAN:  Follicular lymphoma grade iiia, lymph nodes of multiple sites ([place]) Clinically, he has complete response to treatment. We will continue on maintenance rituximab every other month and he agreed to proceed. PET/CT scan done May 2017 show no evidence of cancer I will proceed with treatment without delay.  Barrett esophagus  This was discovered by gastroenterologist. He is currently on proton pump inhibitor and is doing well. Recent PET CT scan showed persistent stomach inflammation We discussed dietary modification and medical adherence to proton pump inhibitor  twice a day.    No orders of the defined types were placed in this encounter.  All questions were answered. The patient knows to call the clinic with any problems, questions or concerns. No barriers to learning was detected. I spent 15 minutes counseling the patient face to face. The total time spent in the appointment was 20 minutes and more than 50% was on counseling and review of test results     [facility], [place], MD 05/06/2016 6:41 PM

## 2016-05-06 NOTE — Progress Notes (Signed)
Pt reported to flush room for port access and lab draw. Port flushes with ease. No blood return. Pt sent to lab to have peripheral draw. Per Dr. Pollyann Samples nurse pt will get cath flo instilled at md appt.

## 2016-06-17 ENCOUNTER — Encounter: Payer: Self-pay | Admitting: Hematology and Oncology

## 2016-06-17 NOTE — Progress Notes (Signed)
Patient called triage about renewing assistance. Advised triage to transfer to me. Spoke with patient who states he received his first bill for Rituxan and he had never received one before and he also has two more treatments and his assistance expired yesterday. Reviewed self-pay in billing and saw where he was billed for date of service. Advised patient that I would email the billing department to have them submit date of service to [facility] for payment. I sent the email as I was talking to the patient. Also asked patient if he had met his deductible and if he had an OOP to meet. Patient wasn't sure if he had met those or not. Advised patient to check with his insurance to find out and then we would know if he needed to re-enroll for assistance or not. Asked patient when his plan renewed and he said 7/1. Advised patient due to the recent insurance renewal, he may not have met his amounts and may possible get billed, therefore I would re-enroll in the copay assistance. Asked patient information over the phone to complete application. Patient approved for Rituxan through [facility] from 06/17/16-06/16/17 for $25,000. A copy of the approval letter and copay card will be mailed to the patient and I also have a copy if he does not receive by mail. I emailed a copy to billing as well as placed a copy to be scanned by HIM. Patient was given my name and number for any additional financial questions or concerns.

## 2016-07-08 ENCOUNTER — Ambulatory Visit: Payer: BLUE CROSS/BLUE SHIELD

## 2016-07-08 ENCOUNTER — Other Ambulatory Visit (HOSPITAL_BASED_OUTPATIENT_CLINIC_OR_DEPARTMENT_OTHER): Payer: BLUE CROSS/BLUE SHIELD

## 2016-07-08 ENCOUNTER — Ambulatory Visit (HOSPITAL_BASED_OUTPATIENT_CLINIC_OR_DEPARTMENT_OTHER): Payer: BLUE CROSS/BLUE SHIELD | Admitting: Hematology and Oncology

## 2016-07-08 ENCOUNTER — Ambulatory Visit (HOSPITAL_BASED_OUTPATIENT_CLINIC_OR_DEPARTMENT_OTHER): Payer: BLUE CROSS/BLUE SHIELD

## 2016-07-08 ENCOUNTER — Telehealth: Payer: Self-pay

## 2016-07-08 ENCOUNTER — Encounter: Payer: Self-pay | Admitting: Hematology and Oncology

## 2016-07-08 VITALS — BP 119/84 | HR 73 | Temp 98.5°F | Resp 18

## 2016-07-08 VITALS — BP 123/76 | HR 74 | Temp 98.3°F | Resp 18 | Ht 70.0 in | Wt 168.7 lb

## 2016-07-08 DIAGNOSIS — Z5112 Encounter for antineoplastic immunotherapy: Secondary | ICD-10-CM | POA: Diagnosis not present

## 2016-07-08 DIAGNOSIS — K2270 Barrett's esophagus without dysplasia: Secondary | ICD-10-CM

## 2016-07-08 DIAGNOSIS — Z23 Encounter for immunization: Secondary | ICD-10-CM | POA: Diagnosis not present

## 2016-07-08 DIAGNOSIS — C8238 Follicular lymphoma grade IIIa, lymph nodes of multiple sites: Secondary | ICD-10-CM | POA: Diagnosis not present

## 2016-07-08 DIAGNOSIS — C8230 Follicular lymphoma grade IIIa, unspecified site: Secondary | ICD-10-CM

## 2016-07-08 LAB — COMPREHENSIVE METABOLIC PANEL
ALT: 20 U/L (ref 0–55)
AST: 21 U/L (ref 5–34)
Albumin: 3.7 g/dL (ref 3.5–5.0)
Alkaline Phosphatase: 56 U/L (ref 40–150)
Anion Gap: 7 mEq/L (ref 3–11)
BUN: 13.2 mg/dL (ref 7.0–26.0)
CO2: 28 mEq/L (ref 22–29)
Calcium: 9.1 mg/dL (ref 8.4–10.4)
Chloride: 104 mEq/L (ref 98–109)
Creatinine: 0.9 mg/dL (ref 0.7–1.3)
EGFR: >90 mL/min/{1.73_m2} (ref >90)
Glucose: 94 mg/dl (ref 70–140)
Potassium: 4.2 mEq/L (ref 3.5–5.1)
Sodium: 139 mEq/L (ref 136–145)
Total Bilirubin: 0.4 mg/dL (ref 0.20–1.20)
Total Protein: 6.6 g/dL (ref 6.4–8.3)

## 2016-07-08 LAB — CBC WITH DIFFERENTIAL/PLATELET
BASO%: 0.7 % (ref 0.0–2.0)
Basophils Absolute: 0 10*3/uL (ref 0.0–0.1)
EOS%: 2.3 % (ref 0.0–7.0)
Eosinophils Absolute: 0.1 10*3/uL (ref 0.0–0.5)
HCT: 39.8 % (ref 38.4–49.9)
HGB: 13.2 g/dL (ref 13.0–17.1)
LYMPH%: 19 % (ref 14.0–49.0)
MCH: 29.5 pg (ref 27.2–33.4)
MCHC: 33.2 g/dL (ref 32.0–36.0)
MCV: 89 fL (ref 79.3–98.0)
MONO#: 0.6 10*3/uL (ref 0.1–0.9)
MONO%: 12.7 % (ref 0.0–14.0)
NEUT#: 2.8 10*3/uL (ref 1.5–6.5)
NEUT%: 65.3 % (ref 39.0–75.0)
Platelets: 252 10*3/uL (ref 140–400)
RBC: 4.47 10*6/uL (ref 4.20–5.82)
RDW: 13.3 % (ref 11.0–14.6)
WBC: 4.4 10*3/uL (ref 4.0–10.3)
lymph#: 0.8 10*3/uL — ABNORMAL LOW (ref 0.9–3.3)

## 2016-07-08 MED ORDER — SODIUM CHLORIDE 0.9 % IV SOLN
Freq: Once | INTRAVENOUS | Status: AC
  Administered 2016-07-08: 09:00:00 via INTRAVENOUS

## 2016-07-08 MED ORDER — DIPHENHYDRAMINE HCL 25 MG PO CAPS
ORAL_CAPSULE | ORAL | Status: AC
  Filled 2016-07-08: qty 2

## 2016-07-08 MED ORDER — SODIUM CHLORIDE 0.9 % IJ SOLN
10.0000 mL | INTRAMUSCULAR | Status: DC | PRN
  Administered 2016-07-08: 10 mL
  Filled 2016-07-08: qty 10

## 2016-07-08 MED ORDER — DIPHENHYDRAMINE HCL 25 MG PO CAPS
50.0000 mg | ORAL_CAPSULE | Freq: Once | ORAL | Status: AC
  Administered 2016-07-08: 50 mg via ORAL

## 2016-07-08 MED ORDER — ACETAMINOPHEN 325 MG PO TABS
650.0000 mg | ORAL_TABLET | Freq: Once | ORAL | Status: AC
  Administered 2016-07-08: 650 mg via ORAL

## 2016-07-08 MED ORDER — SODIUM CHLORIDE 0.9 % IJ SOLN
10.0000 mL | INTRAMUSCULAR | Status: AC | PRN
  Administered 2016-07-08: 10 mL
  Filled 2016-07-08: qty 10

## 2016-07-08 MED ORDER — HEPARIN SOD (PORK) LOCK FLUSH 100 UNIT/ML IV SOLN
500.0000 [IU] | Freq: Once | INTRAVENOUS | Status: AC | PRN
  Administered 2016-07-08: 500 [IU]
  Filled 2016-07-08: qty 5

## 2016-07-08 MED ORDER — INFLUENZA VAC SPLIT QUAD 0.5 ML IM SUSY
0.5000 mL | PREFILLED_SYRINGE | Freq: Once | INTRAMUSCULAR | Status: AC
  Administered 2016-07-08: 0.5 mL via INTRAMUSCULAR
  Filled 2016-07-08: qty 0.5

## 2016-07-08 MED ORDER — SODIUM CHLORIDE 0.9 % IV SOLN
375.0000 mg/m2 | Freq: Once | INTRAVENOUS | Status: AC
  Administered 2016-07-08: 700 mg via INTRAVENOUS
  Filled 2016-07-08: qty 50

## 2016-07-08 MED ORDER — ACETAMINOPHEN 325 MG PO TABS
ORAL_TABLET | ORAL | Status: AC
  Filled 2016-07-08: qty 2

## 2016-07-08 NOTE — Assessment & Plan Note (Signed)
Clinically, he has complete response to treatment. We will continue on maintenance rituximab every other month and he agreed to proceed. PET/CT scan done May 2017 show no evidence of cancer I will proceed with treatment without delay.

## 2016-07-08 NOTE — Assessment & Plan Note (Signed)
This was discovered by gastroenterologist. He is currently on proton pump inhibitor and is doing well. Recent PET CT scan showed persistent stomach inflammation We discussed dietary modification and medical adherence to proton pump inhibitor twice a day.

## 2016-07-08 NOTE — Progress Notes (Signed)
[place] OFFICE PROGRESS NOTE  Patient Care Team: Christain Sacramento, MD as PCP - General (Family Medicine) Jackolyn Confer, MD as Referring Physician (General Surgery) Heath Lark, MD as Consulting Physician (Hematology and Oncology) Carol Ada, MD as Consulting Physician (Gastroenterology)  SUMMARY OF ONCOLOGIC HISTORY: Oncology History   Follicular lymphoma grade 3a   Primary site: Lymphoid Neoplasms   Staging method: AJCC 6th Edition   Clinical free text: Bone marrow is involved on PET; Possible component of DLBCL   Clinical: Stage IV signed by Heath Lark, MD on 06/18/2014  8:05 PM   Summary: Stage IV       Follicular lymphoma grade iiia, lymph nodes of multiple sites ([place])   05/13/2014 Imaging    CT scan of chest and neck showed extensive lymphadenopathy in the neck and medistinum      05/19/2014 Procedure    [id number] US biopsy only showed necrotic tissue      06/03/2014 Surgery    Excisional LN biopsy of right supraclavicular region showed follicular lymphoma grade 3 [id number]      06/14/2014 Imaging    PET CT scan showed diffuse, bulky lymphadenopathy, splenic involvement and bone marrow disease      06/16/2014 Imaging    ECHO showed preserved EF 50%      06/17/2014 Procedure    He has port placement      06/20/2014 - 09/12/2014 Chemotherapy    He received 5 cycles of chemotherapy with R. CHOP along with intrathecal methotrexate. He received 2 doses of IT MTX. He did not receive the final cycle of CHOP due to side effects      08/20/2014 Imaging    Repeat PET/CT scan showed significant improvement with near-complete response to treatment.      10/24/2014 Imaging    Repeat PET CT scan showed near complete resolution of lymphadenopathy      10/31/2014 -  Chemotherapy    He received maintenance Rituxan every 60 days      03/03/2015 Imaging    Repeat PET CT showed no evidence of active disease      09/29/2015 Procedure    He underwent  endoscopy which showed Barrett's esophagus and angiodysplasia      03/03/2016 PET scan    No evidence of lymphoma. Diffuse increase metabolic activity in the stomach. Patient had an upper endoscopy in December 2016 without evidence of malignancy. Therefore, favor findings to represent gastritis       INTERVAL HISTORY: Please see below for problem oriented charting. He returns for cycle #11 of treatment. He is doing well. No new lymphadenopathy. Denies recent infection. Denies recent reflux or GI symptoms  REVIEW OF SYSTEMS:   Constitutional: Denies fevers, chills or abnormal weight loss Eyes: Denies blurriness of vision Ears, nose, mouth, throat, and face: Denies mucositis or sore throat Respiratory: Denies cough, dyspnea or wheezes Cardiovascular: Denies palpitation, chest discomfort or lower extremity swelling Gastrointestinal:  Denies nausea, heartburn or change in bowel habits Skin: Denies abnormal skin rashes Lymphatics: Denies new lymphadenopathy or easy bruising Neurological:Denies numbness, tingling or new weaknesses Behavioral/Psych: Mood is stable, no new changes  All other systems were reviewed with the patient and are negative.  I have reviewed the past medical history, past surgical history, social history and family history with the patient and they are unchanged from previous note.  ALLERGIES:  has No Known Allergies.  MEDICATIONS:  Current Outpatient Prescriptions  Medication Sig Dispense Refill  . cholecalciferol (VITAMIN D) 1000 UNITS  tablet Take 2,000 Units by mouth daily.     Marland Kitchen ibuprofen (ADVIL,MOTRIN) 200 MG tablet Take 200 mg by mouth every 6 (six) hours as needed.    . lidocaine-prilocaine (EMLA) cream Apply 1 application topically as needed. 30 g 6  . lisinopril (PRINIVIL,ZESTRIL) 10 MG tablet TAKE 1 TABLET (10 MG TOTAL) BY MOUTH DAILY. 90 tablet 1  . Melatonin 5 MG TABS Take 10 mg by mouth at bedtime as needed.    Marland Kitchen omeprazole (PRILOSEC) 20 MG capsule  Take 20 mg by mouth 2 (two) times daily before a meal.     No current facility-administered medications for this visit.    Facility-Administered Medications Ordered in Other Visits  Medication Dose Route Frequency Provider Last Rate Last Dose  . 0.9 %  sodium chloride infusion   Intravenous Once Heath Lark, MD      . acetaminophen (TYLENOL) tablet 650 mg  650 mg Oral Once Heath Lark, MD      . diphenhydrAMINE (BENADRYL) capsule 50 mg  50 mg Oral Once Heath Lark, MD      . heparin lock flush 100 unit/mL  500 Units Intracatheter Once PRN Heath Lark, MD      . riTUXimab (RITUXAN) 700 mg in sodium chloride 0.9 % 180 mL chemo infusion  375 mg/m2 (Treatment Plan Recorded) Intravenous Once Heath Lark, MD      . sodium chloride 0.9 % injection 10 mL  10 mL Intracatheter PRN Heath Lark, MD        PHYSICAL EXAMINATION: ECOG PERFORMANCE STATUS: 0 - Asymptomatic  Vitals:   07/08/16 0825  BP: 123/76  Pulse: 74  Resp: 18  Temp: 98.3 F (36.8 C)   Filed Weights   07/08/16 0825  Weight: 168 lb 11.2 oz (76.5 kg)    GENERAL:alert, no distress and comfortable SKIN: skin color, texture, turgor are normal, no rashes or significant lesions EYES: normal, Conjunctiva are pink and non-injected, sclera clear OROPHARYNX:no exudate, no erythema and lips, buccal mucosa, and tongue normal  NECK: supple, thyroid normal size, non-tender, without nodularity LYMPH:  no palpable lymphadenopathy in the cervical, axillary or inguinal LUNGS: clear to auscultation and percussion with normal breathing effort HEART: regular rate & rhythm and no murmurs and no lower extremity edema ABDOMEN:abdomen soft, non-tender and normal bowel sounds Musculoskeletal:no cyanosis of digits and no clubbing  NEURO: alert & oriented x 3 with fluent speech, no focal motor/sensory deficits  LABORATORY DATA:  I have reviewed the data as listed    Component Value Date/Time   NA 139 07/08/2016 0800   K 4.2 07/08/2016 0800   CL 98  06/02/2014 1350   CO2 28 07/08/2016 0800   GLUCOSE 94 07/08/2016 0800   BUN 13.2 07/08/2016 0800   CREATININE 0.9 07/08/2016 0800   CALCIUM 9.1 07/08/2016 0800   PROT 6.6 07/08/2016 0800   ALBUMIN 3.7 07/08/2016 0800   AST 21 07/08/2016 0800   ALT 20 07/08/2016 0800   ALKPHOS 56 07/08/2016 0800   BILITOT 0.40 07/08/2016 0800   GFRNONAA 90 (L) 06/02/2014 1350   GFRAA >90 06/02/2014 1350    No results found for: SPEP, UPEP  Lab Results  Component Value Date   WBC 4.4 07/08/2016   NEUTROABS 2.8 07/08/2016   HGB 13.2 07/08/2016   HCT 39.8 07/08/2016   MCV 89.0 07/08/2016   PLT 252 07/08/2016      Chemistry      Component Value Date/Time   NA 139 07/08/2016 0800  K 4.2 07/08/2016 0800   CL 98 06/02/2014 1350   CO2 28 07/08/2016 0800   BUN 13.2 07/08/2016 0800   CREATININE 0.9 07/08/2016 0800      Component Value Date/Time   CALCIUM 9.1 07/08/2016 0800   ALKPHOS 56 07/08/2016 0800   AST 21 07/08/2016 0800   ALT 20 07/08/2016 0800   BILITOT 0.40 07/08/2016 0800      ASSESSMENT & PLAN:  Follicular lymphoma grade iiia, lymph nodes of multiple sites ([place]) Clinically, he has complete response to treatment. We will continue on maintenance rituximab every other month and he agreed to proceed. PET/CT scan done May 2017 show no evidence of cancer I will proceed with treatment without delay.  Barrett esophagus  This was discovered by gastroenterologist. He is currently on proton pump inhibitor and is doing well. Recent PET CT scan showed persistent stomach inflammation We discussed dietary modification and medical adherence to proton pump inhibitor twice a day.   No orders of the defined types were placed in this encounter.  All questions were answered. The patient knows to call the clinic with any problems, questions or concerns. No barriers to learning was detected. I spent 15 minutes counseling the patient face to face. The total time spent in the appointment was  20 minutes and more than 50% was on counseling and review of test results     Heath Lark, MD 07/08/2016 9:08 AM

## 2016-07-08 NOTE — Telephone Encounter (Signed)
Appointments made,calendar  And avs printed for patient

## 2016-07-08 NOTE — Patient Instructions (Addendum)
[facility] Discharge Instructions for Patients Receiving Chemotherapy  Today you received the following chemotherapy agents Rituxan To help prevent nausea and vomiting after your treatment, we encourage you to take your nausea medication as prescribed.  If you develop nausea and vomiting that is not controlled by your nausea medication, call the clinic.   BELOW ARE SYMPTOMS THAT SHOULD BE REPORTED IMMEDIATELY:  *FEVER GREATER THAN 100.5 F  *CHILLS WITH OR WITHOUT FEVER  NAUSEA AND VOMITING THAT IS NOT CONTROLLED WITH YOUR NAUSEA MEDICATION  *UNUSUAL SHORTNESS OF BREATH  *UNUSUAL BRUISING OR BLEEDING  TENDERNESS IN MOUTH AND THROAT WITH OR WITHOUT PRESENCE OF ULCERS  *URINARY PROBLEMS  *BOWEL PROBLEMS  UNUSUAL RASH Items with * indicate a potential emergency and should be followed up as soon as possible.  Feel free to call the clinic you have any questions or concerns. The clinic phone number is [phone].  Please show the CHEMO ALERT CARD at check-in to the Emergency Department and triage nurse.   

## 2016-07-29 ENCOUNTER — Telehealth: Payer: Self-pay | Admitting: *Deleted

## 2016-07-29 ENCOUNTER — Encounter: Payer: Self-pay | Admitting: *Deleted

## 2016-07-29 NOTE — Telephone Encounter (Signed)
Pt states he is currently being required to work 7 days a week, 70 hours. Pt states he is currently taking rituxan, to finish in December. Is requesting a letter stating he can only work 45 hours per week.

## 2016-07-29 NOTE — Telephone Encounter (Signed)
Pt states he is currently being required to work 7 days a week, 70 hours. Pt states he is currently taking rituxan, to finish in December. Is requesting a letter stating he can only work 45 hours per week.  Letter written and placed on Dr. Calton Dach desk for her signature.  I read letter to pt over the phone and he agreed with what it says.  I will call him when it is ready to pick up.

## 2016-08-01 ENCOUNTER — Telehealth: Payer: Self-pay | Admitting: *Deleted

## 2016-08-01 DIAGNOSIS — C8238 Follicular lymphoma grade IIIa, lymph nodes of multiple sites: Secondary | ICD-10-CM

## 2016-08-01 NOTE — Telephone Encounter (Signed)
He needs neurology eval Cameo, please place urgent consult. He should not be driving until further eval

## 2016-08-01 NOTE — Telephone Encounter (Signed)
Called pt regarding reports of "black outs."  Pt states he has "blacked out" and fell one time after blacking out.  Happens when he goes from sitting to standing or when he is bent over and then straightens back up..  Happened a few days after last Rituxan Tx but then happened again yesterday.  States he continues to take Lisinopril at night but has not been checking his blood pressure.  Advised pt to buy BP cuff and monitor BP at home.  He may not need to be taking Lisinopril anymore if his BP is low.  Also drink plenty of fluids.  Stand slowly from sitting or bending position.  Instructed Dr. Lurlean Leyden advised him not to drive until he is evaluated by Neurology.  Urgent referral placed for the black outs and c/o neuropathy.  Pt verbalized understanding.  Will call back tomorrow to check on his blood pressures.

## 2016-08-01 NOTE — Telephone Encounter (Signed)
Wife called stating that patient has been experiencing "black-out" episodes since last treatment (rituxan- 10/6). A couple of days after treatment, patient blacked out but was able to catch himself (this happened a couple of times). Patient was recently at work and had a "black-out" episode,but this time he fell. Patient is starting to experience numbness and tingling in his hands/feet, which is constant now. Patient states that he is also experiencing fatigue.

## 2016-08-02 ENCOUNTER — Ambulatory Visit (INDEPENDENT_AMBULATORY_CARE_PROVIDER_SITE_OTHER): Payer: BLUE CROSS/BLUE SHIELD | Admitting: Neurology

## 2016-08-02 ENCOUNTER — Other Ambulatory Visit (INDEPENDENT_AMBULATORY_CARE_PROVIDER_SITE_OTHER): Payer: BLUE CROSS/BLUE SHIELD

## 2016-08-02 ENCOUNTER — Telehealth: Payer: Self-pay

## 2016-08-02 ENCOUNTER — Encounter: Payer: Self-pay | Admitting: Neurology

## 2016-08-02 ENCOUNTER — Telehealth: Payer: Self-pay | Admitting: *Deleted

## 2016-08-02 VITALS — HR 87 | Ht 70.0 in | Wt 164.0 lb

## 2016-08-02 DIAGNOSIS — R200 Anesthesia of skin: Secondary | ICD-10-CM

## 2016-08-02 DIAGNOSIS — R202 Paresthesia of skin: Secondary | ICD-10-CM

## 2016-08-02 DIAGNOSIS — R55 Syncope and collapse: Secondary | ICD-10-CM | POA: Diagnosis not present

## 2016-08-02 LAB — TSH: TSH: 0.84 u[IU]/mL (ref 0.35–4.50)

## 2016-08-02 LAB — VITAMIN B12: Vitamin B-12: 956 pg/mL — ABNORMAL HIGH (ref 211–911)

## 2016-08-02 NOTE — Progress Notes (Addendum)
NEUROLOGY CONSULTATION NOTE  Erik Crosby Crosby MRN: [id number] DOB: 1956-04-22  Referring provider: Dr. Alvy Bimler Primary care provider: Dr. Redmond Pulling  Reason for consult:  neuropathy  HISTORY OF PRESENT ILLNESS: Erik Crosby Crosby is Erik Crosby 60 year old right-handed man with follicular lymphoma who presents for  neuropathy.  History obtained by patient and Heme-Onc notes.  Mr. Erik Crosby as diagnosed with follicular lymphoma grade iiia and underwent R. CHOP with intrathecal methotrexate in late 2015, immediately followed by Rituxan for maintenance therapy.  Clinically, he has had complete response to treatment.  PET scan in June showed no evidence of lymphoma.  He began having symptoms of neuropathy towards the end of his chemotherapy in late 2015.  He reported numbness and tingling involving his hands and feet.  Symptoms lasted about 6 months after finishing R. CHOP with methotrexate.   He reports recurrence of numbness and tingling following his last dose of Rituxan in early October, however it is now worse.  He reports paresthesias involving the entire left upper extremity from fingers/hand up to shoulder.  He also reports pain in the left trapezius.  He also has numbness and tingling in the last 4 digits of his right hand and thumb.  The paresthesias are constant and not positional.  He feels Erik Crosby little weak in hand grip.  Other than pain in the left trapezius, he denies neck pain or shooting pain down the arms.  He denies difficulty with balance.    He has had Erik Crosby couple of presyncopal episodes, which occur when getting up too fast.  On Sunday, he was bent over moving Erik Crosby heavy pallet.  When he quickly stood up, he had blacking out of vision, fell and passed out for Erik Crosby couple of seconds.  He was told to stop his lisinopril.    Labs from 07/08/16 include CBC with WBC 4.4, HGB 13.2, HCT 39.8, PLT 252 and absolute lymphocyte count of 0.8; CMP shows Na 139, K 4.2, glucose 94, BUN 13.2, Cr 0.9, total bili 0.40,  ALP 56, AST 21 and ALT 20.  PAST MEDICAL HISTORY: Past Medical History:  Diagnosis Date  . Bone pain 06/23/2014  . Cancer ([place])   . Constipation 07/11/2014  . Follicular lymphoma grade 3a ([place]) 06/12/2014  . GERD (gastroesophageal reflux disease)    occ-takes vinagar  . Headache 07/11/2014  . Insomnia 06/18/2014  . Nausea with vomiting 08/22/2014  . Other fatigue 03/04/2015  . Spinal headache 07/11/2014  . Wears glasses     PAST SURGICAL HISTORY: Past Surgical History:  Procedure Laterality Date  . APPENDECTOMY    . COLONOSCOPY    . LYMPH NODE BIOPSY Right 06/03/2014   Procedure: RIGHT SUPRACLAVICULAR LYMPH NODE BIOPSY;  Surgeon: Odis Hollingshead, MD;  Location: [facility];  Service: General;  Laterality: Right;  . PORTACATH PLACEMENT N/Erik Crosby 06/17/2014   Procedure: ULTRASOUND GUIDED PORT-Erik Crosby-CATH INSERTION;  Surgeon: Jackolyn Confer, MD;  Location: [facility];  Service: General;  Laterality: N/Erik Crosby;    MEDICATIONS: Current Outpatient Prescriptions on File Prior to Visit  Medication Sig Dispense Refill  . cholecalciferol (VITAMIN D) 1000 UNITS tablet Take 2,000 Units by mouth daily.     Marland Kitchen ibuprofen (ADVIL,MOTRIN) 200 MG tablet Take 200 mg by mouth every 6 (six) hours as needed.    . lidocaine-prilocaine (EMLA) cream Apply 1 application topically as needed. 30 g 6  . Melatonin 5 MG TABS Take 10 mg by mouth at bedtime as needed.    Marland Kitchen omeprazole (PRILOSEC) 20 MG  capsule Take 20 mg by mouth 2 (two) times daily before Erik Crosby meal.    . lisinopril (PRINIVIL,ZESTRIL) 10 MG tablet TAKE 1 TABLET (10 MG TOTAL) BY MOUTH DAILY. 90 tablet 1   No current facility-administered medications on file prior to visit.     ALLERGIES: No Known Allergies  FAMILY HISTORY: Family History  Problem Relation Age of Onset  . Cancer Mother     colon ca & pancreatic ca  . Heart disease Father     SOCIAL HISTORY: Social History   Social History  . Marital status: Married    Spouse name: N/Erik Crosby  . Number of  children: N/Erik Crosby  . Years of education: N/Erik Crosby   Occupational History  . Not on file.   Social History Main Topics  . Smoking status: Never Smoker  . Smokeless tobacco: Never Used  . Alcohol use No  . Drug use: No  . Sexual activity: Not on file   Other Topics Concern  . Not on file   Social History Narrative  . No narrative on file    REVIEW OF SYSTEMS: Constitutional: No fevers, chills, or sweats, no generalized fatigue, change in appetite Eyes: No visual changes, double vision, eye pain Ear, nose and throat: No hearing loss, ear pain, nasal congestion, sore throat Cardiovascular: No chest pain, palpitations Respiratory:  No shortness of breath at rest or with exertion, wheezes GastrointestinaI: No nausea, vomiting, diarrhea, abdominal pain, fecal incontinence Genitourinary:  No dysuria, urinary retention or frequency Musculoskeletal:  No neck pain, back pain Integumentary: No rash, pruritus, skin lesions Neurological: as above Psychiatric: No depression, insomnia, anxiety Endocrine: No palpitations, fatigue, diaphoresis, mood swings, change in appetite, change in weight, increased thirst Hematologic/Lymphatic:  No purpura, petechiae. Allergic/Immunologic: no itchy/runny eyes, nasal congestion, recent allergic reactions, rashes  PHYSICAL EXAM: Vitals:   08/02/16 0844  Pulse: 87   General: No acute distress.  Patient appears well-groomed.  Head:  Normocephalic/atraumatic Eyes:  fundi examined but not visualized Neck: supple, no paraspinal tenderness, full range of motion Back: No paraspinal tenderness Heart: regular rate and rhythm Lungs: Clear to auscultation bilaterally. Vascular: No carotid bruits. Neurological Exam: Mental status: alert and oriented to person, place, and time, recent and remote memory intact, fund of knowledge intact, attention and concentration intact, speech fluent and not dysarthric, language intact. Cranial nerves: CN I: not tested CN II:  pupils equal, round and reactive to light, visual fields intact CN III, IV, VI:  full range of motion, no nystagmus, no ptosis CN V: facial sensation intact CN VII: upper and lower face symmetric CN VIII: hearing intact CN IX, X: gag intact, uvula midline CN XI: sternocleidomastoid and trapezius muscles intact CN XII: tongue midline Bulk & Tone: normal, no fasciculations. Motor:  5/5 throughout  Sensation:  Pinprick sensation reveals no decreased sensation but instead reveals hyperesthesia of the entire left hand, forearm and lateral upper arm, as well as the 5th digit of his right hand.  Vibration sensation intact. Deep Tendon Reflexes:  2+ throughout except 3+ in patellars (symmetric), toes downgoing.  Finger to nose testing:  Without dysmetria.  Heel to shin:  Without dysmetria.  Gait:  Normal station and stride.  Able to turn and tandem walk. Romberg negative.  IMPRESSION: Paresthesias, possible neuropathy.  Unusual side effect with Rituxan. Syncope.  Likely orthostatic vs vasovagal.  PLAN: We will check TSH and B12.  If B12 is low, will supplement and see if symptoms resolve.  If labs unremarkable, recommend NCV-EMG.  Thank  you for allowing me to take part in the care of this patient.  Metta Clines, DO  CC:  Heath Lark, MD  Kathryne Eriksson, MD

## 2016-08-02 NOTE — Patient Instructions (Signed)
1.  We will check TSH and B12 level.  If B12 is low, we will start you on supplements 2.  If numbness does not resolve within 3 to 6 months (or if gets worse sooner than that), then contact me and we can set you up for a nerve study.

## 2016-08-02 NOTE — Telephone Encounter (Signed)
Pt wishes to speak to Oncologist before scheduling test. Will call back with decision. Will close encounter for now.

## 2016-08-02 NOTE — Progress Notes (Signed)
Chart forwarded.  

## 2016-08-02 NOTE — Progress Notes (Signed)
They sound orthostatic to me, so I don't think MRI is needed at this time.  I will addend my note to include this.

## 2016-08-02 NOTE — Telephone Encounter (Signed)
-----   Message from Pieter Partridge, DO sent at 08/02/2016 12:39 PM EDT ----- TSH and B12 are fine.  My recommendation is NCV-EMG to evaluate for neuropathy (focus on left upper and lower extremities)

## 2016-08-03 NOTE — Telephone Encounter (Signed)
Pt saw Neurologist yesterday.  He says he stopped taking Lisinopril but has not checked his BP yet.  He has not had any further blacking out episodes.  Instructed pt to monitor his BP daily and if it is high off the lisinopril he needs to go to PCP to manage.  Follow w/ Neuro as scheduled.  He verbalized understanding.

## 2016-08-29 ENCOUNTER — Telehealth: Payer: Self-pay | Admitting: *Deleted

## 2016-08-29 NOTE — Telephone Encounter (Signed)
Pt reports feels a new lump in left groin.  It is not painful and he first noticed it this past Friday but unsure how long it has been there.  Pt sees Dr. Alvy Bimler this Friday.  Instructed pt to keep appt this Friday 12/1 as scheduled.  If Dr. Alvy Bimler has any other instructions I will call him back tomorrow. He verbalized understanding.

## 2016-08-30 NOTE — Telephone Encounter (Signed)
Nothing to add. I will examine him first and determine if he needs imaging

## 2016-09-02 ENCOUNTER — Telehealth: Payer: Self-pay | Admitting: Hematology and Oncology

## 2016-09-02 ENCOUNTER — Ambulatory Visit (HOSPITAL_BASED_OUTPATIENT_CLINIC_OR_DEPARTMENT_OTHER): Payer: BLUE CROSS/BLUE SHIELD

## 2016-09-02 ENCOUNTER — Ambulatory Visit: Payer: BLUE CROSS/BLUE SHIELD

## 2016-09-02 ENCOUNTER — Ambulatory Visit (HOSPITAL_BASED_OUTPATIENT_CLINIC_OR_DEPARTMENT_OTHER): Payer: BLUE CROSS/BLUE SHIELD | Admitting: Hematology and Oncology

## 2016-09-02 ENCOUNTER — Other Ambulatory Visit (HOSPITAL_BASED_OUTPATIENT_CLINIC_OR_DEPARTMENT_OTHER): Payer: BLUE CROSS/BLUE SHIELD

## 2016-09-02 ENCOUNTER — Encounter: Payer: Self-pay | Admitting: Hematology and Oncology

## 2016-09-02 VITALS — BP 156/94 | HR 79 | Temp 98.8°F | Resp 18

## 2016-09-02 VITALS — BP 158/94 | HR 74 | Temp 98.8°F | Resp 18 | Ht 70.0 in | Wt 168.0 lb

## 2016-09-02 DIAGNOSIS — K644 Residual hemorrhoidal skin tags: Secondary | ICD-10-CM | POA: Diagnosis not present

## 2016-09-02 DIAGNOSIS — C8238 Follicular lymphoma grade IIIa, lymph nodes of multiple sites: Secondary | ICD-10-CM

## 2016-09-02 DIAGNOSIS — Z5112 Encounter for antineoplastic immunotherapy: Secondary | ICD-10-CM

## 2016-09-02 DIAGNOSIS — R590 Localized enlarged lymph nodes: Secondary | ICD-10-CM | POA: Diagnosis not present

## 2016-09-02 DIAGNOSIS — C8230 Follicular lymphoma grade IIIa, unspecified site: Secondary | ICD-10-CM

## 2016-09-02 LAB — CBC WITH DIFFERENTIAL/PLATELET
BASO%: 0.5 % (ref 0.0–2.0)
Basophils Absolute: 0 10*3/uL (ref 0.0–0.1)
EOS%: 1.1 % (ref 0.0–7.0)
Eosinophils Absolute: 0 10*3/uL (ref 0.0–0.5)
HCT: 40.3 % (ref 38.4–49.9)
HGB: 13.2 g/dL (ref 13.0–17.1)
LYMPH%: 18.2 % (ref 14.0–49.0)
MCH: 29 pg (ref 27.2–33.4)
MCHC: 32.7 g/dL (ref 32.0–36.0)
MCV: 88.7 fL (ref 79.3–98.0)
MONO#: 0.6 10*3/uL (ref 0.1–0.9)
MONO%: 12.8 % (ref 0.0–14.0)
NEUT#: 3.1 10*3/uL (ref 1.5–6.5)
NEUT%: 67.4 % (ref 39.0–75.0)
Platelets: 245 10*3/uL (ref 140–400)
RBC: 4.55 10*6/uL (ref 4.20–5.82)
RDW: 13.5 % (ref 11.0–14.6)
WBC: 4.6 10*3/uL (ref 4.0–10.3)
lymph#: 0.8 10*3/uL — ABNORMAL LOW (ref 0.9–3.3)

## 2016-09-02 LAB — COMPREHENSIVE METABOLIC PANEL
ALT: 33 U/L (ref 0–55)
AST: 27 U/L (ref 5–34)
Albumin: 3.7 g/dL (ref 3.5–5.0)
Alkaline Phosphatase: 55 U/L (ref 40–150)
Anion Gap: 8 mEq/L (ref 3–11)
BUN: 15.1 mg/dL (ref 7.0–26.0)
CO2: 27 mEq/L (ref 22–29)
Calcium: 9 mg/dL (ref 8.4–10.4)
Chloride: 103 mEq/L (ref 98–109)
Creatinine: 0.8 mg/dL (ref 0.7–1.3)
EGFR: >90 mL/min/{1.73_m2} (ref >90)
Glucose: 92 mg/dl (ref 70–140)
Potassium: 4.1 mEq/L (ref 3.5–5.1)
Sodium: 137 mEq/L (ref 136–145)
Total Bilirubin: 0.37 mg/dL (ref 0.20–1.20)
Total Protein: 6.5 g/dL (ref 6.4–8.3)

## 2016-09-02 MED ORDER — SODIUM CHLORIDE 0.9 % IJ SOLN
10.0000 mL | INTRAMUSCULAR | Status: AC | PRN
  Administered 2016-09-02: 10 mL
  Filled 2016-09-02: qty 10

## 2016-09-02 MED ORDER — METRONIDAZOLE 500 MG PO TABS: 500.0000 mg | ORAL_TABLET | Freq: Three times a day (TID) | ORAL | 0 refills | Status: DC

## 2016-09-02 MED ORDER — ACETAMINOPHEN 325 MG PO TABS
ORAL_TABLET | ORAL | Status: AC
  Filled 2016-09-02: qty 2

## 2016-09-02 MED ORDER — ACETAMINOPHEN 325 MG PO TABS
650.0000 mg | ORAL_TABLET | Freq: Once | ORAL | Status: AC
  Administered 2016-09-02: 650 mg via ORAL

## 2016-09-02 MED ORDER — HEPARIN SOD (PORK) LOCK FLUSH 100 UNIT/ML IV SOLN
500.0000 [IU] | Freq: Once | INTRAVENOUS | Status: AC | PRN
  Administered 2016-09-02: 500 [IU]
  Filled 2016-09-02: qty 5

## 2016-09-02 MED ORDER — DIPHENHYDRAMINE HCL 25 MG PO CAPS
ORAL_CAPSULE | ORAL | Status: AC
  Filled 2016-09-02: qty 2

## 2016-09-02 MED ORDER — SODIUM CHLORIDE 0.9 % IJ SOLN
10.0000 mL | INTRAMUSCULAR | Status: DC | PRN
Start: 2016-09-02 — End: 2016-09-02
  Administered 2016-09-02: 10 mL
  Filled 2016-09-02: qty 10

## 2016-09-02 MED ORDER — SODIUM CHLORIDE 0.9 % IV SOLN
375.0000 mg/m2 | Freq: Once | INTRAVENOUS | Status: AC
  Administered 2016-09-02: 700 mg via INTRAVENOUS
  Filled 2016-09-02: qty 20

## 2016-09-02 MED ORDER — CIPROFLOXACIN HCL 500 MG PO TABS: 500.0000 mg | ORAL_TABLET | Freq: Two times a day (BID) | ORAL | 0 refills | Status: DC

## 2016-09-02 MED ORDER — SODIUM CHLORIDE 0.9 % IV SOLN
Freq: Once | INTRAVENOUS | Status: AC
  Administered 2016-09-02: 10:00:00 via INTRAVENOUS

## 2016-09-02 MED ORDER — DIPHENHYDRAMINE HCL 25 MG PO CAPS
50.0000 mg | ORAL_CAPSULE | Freq: Once | ORAL | Status: AC
  Administered 2016-09-02: 50 mg via ORAL

## 2016-09-02 NOTE — Patient Instructions (Signed)
[facility] Discharge Instructions for Patients Receiving Chemotherapy  Today you received the following chemotherapy agents Rituxan To help prevent nausea and vomiting after your treatment, we encourage you to take your nausea medication as prescribed.  If you develop nausea and vomiting that is not controlled by your nausea medication, call the clinic.   BELOW ARE SYMPTOMS THAT SHOULD BE REPORTED IMMEDIATELY:  *FEVER GREATER THAN 100.5 F  *CHILLS WITH OR WITHOUT FEVER  NAUSEA AND VOMITING THAT IS NOT CONTROLLED WITH YOUR NAUSEA MEDICATION  *UNUSUAL SHORTNESS OF BREATH  *UNUSUAL BRUISING OR BLEEDING  TENDERNESS IN MOUTH AND THROAT WITH OR WITHOUT PRESENCE OF ULCERS  *URINARY PROBLEMS  *BOWEL PROBLEMS  UNUSUAL RASH Items with * indicate a potential emergency and should be followed up as soon as possible.  Feel free to call the clinic you have any questions or concerns. The clinic phone number is [phone].  Please show the CHEMO ALERT CARD at check-in to the Emergency Department and triage nurse.   

## 2016-09-02 NOTE — Assessment & Plan Note (Signed)
He had recent inflamed hemorrhoids that is bothering him. That could cause inguinal lymphadenopathy I recommend a trial of oral antibiotic therapy and he agreed to proceed

## 2016-09-02 NOTE — Telephone Encounter (Signed)
Appointments scheduled per 09/02/16 los. A copy of the AVS report and appointment schedule was given to patient,per 09/02/16 los.  ° ° °

## 2016-09-02 NOTE — Assessment & Plan Note (Signed)
The inguinal adenopathy is new. It could be infectious in nature versus cancer recurrence. I will order a PET CT scan for evaluation

## 2016-09-02 NOTE — Progress Notes (Signed)
[facility] OFFICE PROGRESS NOTE  Patient Care Team: Christain Sacramento, MD as PCP - General (Family Medicine) Jackolyn Confer, MD as Referring Physician (General Surgery) Heath Lark, MD as Consulting Physician (Hematology and Oncology) Carol Ada, MD as Consulting Physician (Gastroenterology)  SUMMARY OF ONCOLOGIC HISTORY: Oncology History   Follicular lymphoma grade 3a   Primary site: Lymphoid Neoplasms   Staging method: AJCC 6th Edition   Clinical free text: Bone marrow is involved on PET; Possible component of DLBCL   Clinical: Stage IV signed by Heath Lark, MD on 06/18/2014  8:05 PM   Summary: Stage IV       Follicular lymphoma grade iiia, lymph nodes of multiple sites ([place])   05/13/2014 Imaging    CT scan of chest and neck showed extensive lymphadenopathy in the neck and medistinum      05/19/2014 Procedure    [id number] US biopsy only showed necrotic tissue      06/03/2014 Surgery    Excisional LN biopsy of right supraclavicular region showed follicular lymphoma grade 3 [id number]      06/14/2014 Imaging    PET CT scan showed diffuse, bulky lymphadenopathy, splenic involvement and bone marrow disease      06/16/2014 Imaging    ECHO showed preserved EF 50%      06/17/2014 Procedure    He has port placement      06/20/2014 - 09/12/2014 Chemotherapy    He received 5 cycles of chemotherapy with R. CHOP along with intrathecal methotrexate. He received 2 doses of IT MTX. He did not receive the final cycle of CHOP due to side effects      08/20/2014 Imaging    Repeat PET/CT scan showed significant improvement with near-complete response to treatment.      10/24/2014 Imaging    Repeat PET CT scan showed near complete resolution of lymphadenopathy      10/31/2014 -  Chemotherapy    He received maintenance Rituxan every 60 days      03/03/2015 Imaging    Repeat PET CT showed no evidence of active disease      09/29/2015 Procedure    He underwent  endoscopy which showed Barrett's esophagus and angiodysplasia      03/03/2016 PET scan    No evidence of lymphoma. Diffuse increase metabolic activity in the stomach. Patient had an upper endoscopy in December 2016 without evidence of malignancy. Therefore, favor findings to represent gastritis       INTERVAL HISTORY: Please see below for problem oriented charting. He is seen for treatment today. Over the past 2 weeks, he has intermittent inflamed hemorrhoidal bleeding. He also noted new onset of left inguinal lymphadenopathy. It was not painful for touch. He denied other lymphadenopathy He denies recent syncopal episode or recurrence of dizziness  REVIEW OF SYSTEMS:   Constitutional: Denies fevers, chills or abnormal weight loss Eyes: Denies blurriness of vision Ears, nose, mouth, throat, and face: Denies mucositis or sore throat Respiratory: Denies cough, dyspnea or wheezes Cardiovascular: Denies palpitation, chest discomfort or lower extremity swelling Gastrointestinal:  Denies nausea, heartburn or change in bowel habits Skin: Denies abnormal skin rashes Neurological:Denies numbness, tingling or new weaknesses Behavioral/Psych: Mood is stable, no new changes  All other systems were reviewed with the patient and are negative.  I have reviewed the past medical history, past surgical history, social history and family history with the patient and they are unchanged from previous note.  ALLERGIES:  has No Known Allergies.  MEDICATIONS:  Current Outpatient Prescriptions  Medication Sig Dispense Refill  . cholecalciferol (VITAMIN D) 1000 UNITS tablet Take 2,000 Units by mouth daily.     Marland Kitchen ibuprofen (ADVIL,MOTRIN) 200 MG tablet Take 200 mg by mouth every 6 (six) hours as needed.    . lidocaine-prilocaine (EMLA) cream Apply 1 application topically as needed. 30 g 6  . lisinopril (PRINIVIL,ZESTRIL) 10 MG tablet TAKE 1 TABLET (10 MG TOTAL) BY MOUTH DAILY. 90 tablet 1  . Melatonin 5 MG  TABS Take 10 mg by mouth at bedtime as needed.    Marland Kitchen omeprazole (PRILOSEC) 20 MG capsule Take 20 mg by mouth 2 (two) times daily before a meal.    . ciprofloxacin (CIPRO) 500 MG tablet Take 1 tablet (500 mg total) by mouth 2 (two) times daily. 14 tablet 0  . metroNIDAZOLE (FLAGYL) 500 MG tablet Take 1 tablet (500 mg total) by mouth 3 (three) times daily. 21 tablet 0   No current facility-administered medications for this visit.    Facility-Administered Medications Ordered in Other Visits  Medication Dose Route Frequency Provider Last Rate Last Dose  . heparin lock flush 100 unit/mL  500 Units Intracatheter Once PRN Heath Lark, MD      . sodium chloride 0.9 % injection 10 mL  10 mL Intracatheter PRN Heath Lark, MD        PHYSICAL EXAMINATION: ECOG PERFORMANCE STATUS: 1 - Symptomatic but completely ambulatory  Vitals:   09/02/16 0858  BP: (!) 158/94  Pulse: 74  Resp: 18  Temp: 98.8 F (37.1 C)   Filed Weights   09/02/16 0858  Weight: 168 lb (76.2 kg)    GENERAL:alert, no distress and comfortable SKIN: skin color, texture, turgor are normal, no rashes or significant lesions EYES: normal, Conjunctiva are pink and non-injected, sclera clear OROPHARYNX:no exudate, no erythema and lips, buccal mucosa, and tongue normal  NECK: supple, thyroid normal size, non-tender, without nodularity LYMPH:  He had new palpable lymphadenopathy in the left inguinal region. No evidence of cervical or axillary lymphadenopathy LUNGS: clear to auscultation and percussion with normal breathing effort HEART: regular rate & rhythm and no murmurs and no lower extremity edema ABDOMEN:abdomen soft, non-tender and normal bowel sounds Musculoskeletal:no cyanosis of digits and no clubbing  NEURO: alert & oriented x 3 with fluent speech, no focal motor/sensory deficits  LABORATORY DATA:  I have reviewed the data as listed    Component Value Date/Time   NA 137 09/02/2016 0837   K 4.1 09/02/2016 0837   CL 98  06/02/2014 1350   CO2 27 09/02/2016 0837   GLUCOSE 92 09/02/2016 0837   BUN 15.1 09/02/2016 0837   CREATININE 0.8 09/02/2016 0837   CALCIUM 9.0 09/02/2016 0837   PROT 6.5 09/02/2016 0837   ALBUMIN 3.7 09/02/2016 0837   AST 27 09/02/2016 0837   ALT 33 09/02/2016 0837   ALKPHOS 55 09/02/2016 0837   BILITOT 0.37 09/02/2016 0837   GFRNONAA 90 (L) 06/02/2014 1350   GFRAA >90 06/02/2014 1350    No results found for: SPEP, UPEP  Lab Results  Component Value Date   WBC 4.6 09/02/2016   NEUTROABS 3.1 09/02/2016   HGB 13.2 09/02/2016   HCT 40.3 09/02/2016   MCV 88.7 09/02/2016   PLT 245 09/02/2016      Chemistry      Component Value Date/Time   NA 137 09/02/2016 0837   K 4.1 09/02/2016 0837   CL 98 06/02/2014 1350   CO2 27 09/02/2016 0837  BUN 15.1 09/02/2016 0837   CREATININE 0.8 09/02/2016 0837      Component Value Date/Time   CALCIUM 9.0 09/02/2016 0837   ALKPHOS 55 09/02/2016 0837   AST 27 09/02/2016 0837   ALT 33 09/02/2016 0837   BILITOT 0.37 09/02/2016 0837      ASSESSMENT & PLAN:  Follicular lymphoma grade iiia, lymph nodes of multiple sites ([place]) Clinically, he has complete response to treatment. We will continue on maintenance rituximab every other month and he agreed to proceed. PET/CT scan done May 2017 show no evidence of cancer However, he has new lymphadenopathy. It could be infectious in nature versus cancer recurrence. I plan to order a PET CT scan for further evaluation.  LAD (lymphadenopathy), inguinal The inguinal adenopathy is new. It could be infectious in nature versus cancer recurrence. I will order a PET CT scan for evaluation  Inflamed external hemorrhoid He had recent inflamed hemorrhoids that is bothering him. That could cause inguinal lymphadenopathy I recommend a trial of oral antibiotic therapy and he agreed to proceed   Orders Placed This Encounter  Procedures  . NM PET Image Restag (PS) Skull Base To Thigh    Standing  Status:   Future    Standing Expiration Date:   10/07/2017    Order Specific Question:   Reason for exam:    Answer:   new lymphadenopathy left inguinal region, exclude lymphoma relapse    Order Specific Question:   Preferred imaging location?    Answer:   [facility]   All questions were answered. The patient knows to call the clinic with any problems, questions or concerns. No barriers to learning was detected. I spent 25 minutes counseling the patient face to face. The total time spent in the appointment was 30 minutes and more than 50% was on counseling and review of test results     Heath Lark, MD 09/02/2016 10:20 AM

## 2016-09-02 NOTE — Assessment & Plan Note (Signed)
Clinically, he has complete response to treatment. We will continue on maintenance rituximab every other month and he agreed to proceed. PET/CT scan done May 2017 show no evidence of cancer However, he has new lymphadenopathy. It could be infectious in nature versus cancer recurrence. I plan to order a PET CT scan for further evaluation.

## 2016-09-12 ENCOUNTER — Other Ambulatory Visit: Payer: Self-pay | Admitting: Hematology and Oncology

## 2016-09-12 ENCOUNTER — Ambulatory Visit: Payer: BLUE CROSS/BLUE SHIELD | Admitting: Hematology and Oncology

## 2016-09-13 ENCOUNTER — Encounter (HOSPITAL_COMMUNITY)
Admission: RE | Admit: 2016-09-13 | Discharge: 2016-09-13 | Disposition: A | Discharge status: Home / Self Care | Payer: BLUE CROSS/BLUE SHIELD | Source: Ambulatory Visit | Attending: Hematology and Oncology | Admitting: Hematology and Oncology

## 2016-09-13 DIAGNOSIS — C8238 Follicular lymphoma grade IIIa, lymph nodes of multiple sites: Secondary | ICD-10-CM | POA: Insufficient documentation

## 2016-09-13 LAB — GLUCOSE, CAPILLARY: Glucose-Capillary: 81 mg/dL (ref 65–99)

## 2016-09-13 MED ORDER — FLUDEOXYGLUCOSE F - 18 (FDG) INJECTION
8.2000 | Freq: Once | INTRAVENOUS | Status: AC | PRN
  Administered 2016-09-13: 8.2 via INTRAVENOUS

## 2016-09-14 ENCOUNTER — Other Ambulatory Visit: Payer: Self-pay | Admitting: Hematology and Oncology

## 2016-09-14 ENCOUNTER — Telehealth: Payer: Self-pay | Admitting: Hematology and Oncology

## 2016-09-14 NOTE — Telephone Encounter (Signed)
I reviewed the PET CT scan result with the patient over the telephone. His bowel issues had resolved. He has persistent lymphadenopathy in the left inguinal region, unchanged. I reviewed the imaging study myself which show no evidence to suggest recurrence of lymphoma. The patient is delighted with the news. I will cancel his appointment tomorrow and reschedule for follow-up in 6 months. The patient will call me when he is ready to have the port removed

## 2016-09-15 ENCOUNTER — Ambulatory Visit: Payer: BLUE CROSS/BLUE SHIELD | Admitting: Hematology and Oncology

## 2016-09-20 ENCOUNTER — Ambulatory Visit: Payer: BLUE CROSS/BLUE SHIELD | Admitting: Hematology and Oncology

## 2017-03-16 ENCOUNTER — Other Ambulatory Visit (HOSPITAL_BASED_OUTPATIENT_CLINIC_OR_DEPARTMENT_OTHER): Payer: BLUE CROSS/BLUE SHIELD

## 2017-03-16 ENCOUNTER — Encounter: Payer: Self-pay | Admitting: Hematology and Oncology

## 2017-03-16 ENCOUNTER — Ambulatory Visit (HOSPITAL_BASED_OUTPATIENT_CLINIC_OR_DEPARTMENT_OTHER): Payer: BLUE CROSS/BLUE SHIELD | Admitting: Hematology and Oncology

## 2017-03-16 ENCOUNTER — Telehealth: Payer: Self-pay | Admitting: Hematology and Oncology

## 2017-03-16 VITALS — BP 150/69 | HR 93 | Temp 98.7°F | Resp 20 | Ht 70.0 in | Wt 174.6 lb

## 2017-03-16 DIAGNOSIS — C8238 Follicular lymphoma grade IIIa, lymph nodes of multiple sites: Secondary | ICD-10-CM

## 2017-03-16 DIAGNOSIS — R5383 Other fatigue: Secondary | ICD-10-CM | POA: Diagnosis not present

## 2017-03-16 DIAGNOSIS — I10 Essential (primary) hypertension: Secondary | ICD-10-CM | POA: Diagnosis not present

## 2017-03-16 DIAGNOSIS — C8230 Follicular lymphoma grade IIIa, unspecified site: Secondary | ICD-10-CM

## 2017-03-16 DIAGNOSIS — R635 Abnormal weight gain: Secondary | ICD-10-CM | POA: Diagnosis not present

## 2017-03-16 LAB — CBC WITH DIFFERENTIAL/PLATELET
BASO%: 0.2 % (ref 0.0–2.0)
Basophils Absolute: 0 10*3/uL (ref 0.0–0.1)
EOS%: 1.4 % (ref 0.0–7.0)
Eosinophils Absolute: 0.1 10*3/uL (ref 0.0–0.5)
HCT: 40.6 % (ref 38.4–49.9)
HGB: 13.3 g/dL (ref 13.0–17.1)
LYMPH%: 20.5 % (ref 14.0–49.0)
MCH: 30.1 pg (ref 27.2–33.4)
MCHC: 32.8 g/dL (ref 32.0–36.0)
MCV: 91.9 fL (ref 79.3–98.0)
MONO#: 0.6 10*3/uL (ref 0.1–0.9)
MONO%: 11.4 % (ref 0.0–14.0)
NEUT#: 3.4 10*3/uL (ref 1.5–6.5)
NEUT%: 66.5 % (ref 39.0–75.0)
Platelets: 218 10*3/uL (ref 140–400)
RBC: 4.42 10*6/uL (ref 4.20–5.82)
RDW: 13.3 % (ref 11.0–14.6)
WBC: 5.1 10*3/uL (ref 4.0–10.3)
lymph#: 1 10*3/uL (ref 0.9–3.3)

## 2017-03-16 LAB — COMPREHENSIVE METABOLIC PANEL
ALT: 26 U/L (ref 0–55)
AST: 23 U/L (ref 5–34)
Albumin: 3.7 g/dL (ref 3.5–5.0)
Alkaline Phosphatase: 59 U/L (ref 40–150)
Anion Gap: 9 mEq/L (ref 3–11)
BUN: 12.7 mg/dL (ref 7.0–26.0)
CO2: 27 mEq/L (ref 22–29)
Calcium: 9.2 mg/dL (ref 8.4–10.4)
Chloride: 104 mEq/L (ref 98–109)
Creatinine: 0.9 mg/dL (ref 0.7–1.3)
EGFR: >90 mL/min/{1.73_m2} (ref >90)
Glucose: 91 mg/dl (ref 70–140)
Potassium: 4.1 mEq/L (ref 3.5–5.1)
Sodium: 139 mEq/L (ref 136–145)
Total Bilirubin: 0.49 mg/dL (ref 0.20–1.20)
Total Protein: 6.7 g/dL (ref 6.4–8.3)

## 2017-03-16 NOTE — Assessment & Plan Note (Signed)
He continued to have chronic fatigue, along with recent weight gain and mood changes, recommend TSH in his next visit He agreed

## 2017-03-16 NOTE — Progress Notes (Signed)
[facility] OFFICE PROGRESS NOTE  Patient Care Team: Christain Sacramento, MD as PCP - General (Family Medicine) Jackolyn Confer, MD as Referring Physician (General Surgery) Heath Lark, MD as Consulting Physician (Hematology and Oncology) Carol Ada, MD as Consulting Physician (Gastroenterology)  SUMMARY OF ONCOLOGIC HISTORY: Oncology History   Follicular lymphoma grade 3a   Primary site: Lymphoid Neoplasms   Staging method: AJCC 6th Edition   Clinical free text: Bone marrow is involved on PET; Possible component of DLBCL   Clinical: Stage IV signed by Heath Lark, MD on 06/18/2014  8:05 PM   Summary: Stage IV       Follicular lymphoma grade iiia, lymph nodes of multiple sites ([place])   05/13/2014 Imaging    CT scan of chest and neck showed extensive lymphadenopathy in the neck and medistinum      05/19/2014 Procedure    [id number] US biopsy only showed necrotic tissue      06/03/2014 Surgery    Excisional LN biopsy of right supraclavicular region showed follicular lymphoma grade 3 [id number]      06/14/2014 Imaging    PET CT scan showed diffuse, bulky lymphadenopathy, splenic involvement and bone marrow disease      06/16/2014 Imaging    ECHO showed preserved EF 50%      06/17/2014 Procedure    He has port placement      06/20/2014 - 09/12/2014 Chemotherapy    He received 5 cycles of chemotherapy with R. CHOP along with intrathecal methotrexate. He received 2 doses of IT MTX. He did not receive the final cycle of CHOP due to side effects      08/20/2014 Imaging    Repeat PET/CT scan showed significant improvement with near-complete response to treatment.      10/24/2014 Imaging    Repeat PET CT scan showed near complete resolution of lymphadenopathy      10/31/2014 - 09/02/2016 Chemotherapy    He received maintenance Rituxan every 60 days for 2 years      03/03/2015 Imaging    Repeat PET CT showed no evidence of active disease      09/29/2015 Procedure     He underwent endoscopy which showed Barrett's esophagus and angiodysplasia      03/03/2016 PET scan    No evidence of lymphoma. Diffuse increase metabolic activity in the stomach. Patient had an upper endoscopy in December 2016 without evidence of malignancy. Therefore, favor findings to represent gastritis      09/13/2016 PET scan    No findings suspicious for lymphoma. Specifically, no suspicious left inguinal lymphadenopathy.         INTERVAL HISTORY: Please see below for problem oriented charting. He returns for further follow-up He has some mild congestion, resolving with over-the-counter remedies He denies new lymphadenopathy Denies recent weight loss In fact, he has gained some weight and complain of fatigue He has changed his job recently He has some mood changes but denies depression or suicidal ideation  REVIEW OF SYSTEMS:   Constitutional: Denies fevers, chills or abnormal weight loss Eyes: Denies blurriness of vision Ears, nose, mouth, throat, and face: Denies mucositis or sore throat Respiratory: Denies cough, dyspnea or wheezes Cardiovascular: Denies palpitation, chest discomfort or lower extremity swelling Gastrointestinal:  Denies nausea, heartburn or change in bowel habits Skin: Denies abnormal skin rashes Lymphatics: Denies new lymphadenopathy or easy bruising Neurological:Denies numbness, tingling or new weaknesses Behavioral/Psych: Mood is stable, no new changes  All other systems were reviewed with the patient  and are negative.  I have reviewed the past medical history, past surgical history, social history and family history with the patient and they are unchanged from previous note.  ALLERGIES:  has No Known Allergies.  MEDICATIONS:  Current Outpatient Prescriptions  Medication Sig Dispense Refill  . cholecalciferol (VITAMIN D) 1000 UNITS tablet Take 2,000 Units by mouth daily.     Marland Kitchen ibuprofen (ADVIL,MOTRIN) 200 MG tablet Take 200 mg by mouth  every 6 (six) hours as needed.    Marland Kitchen lisinopril (PRINIVIL,ZESTRIL) 10 MG tablet TAKE 1 TABLET (10 MG TOTAL) BY MOUTH DAILY. 90 tablet 1  . Melatonin 5 MG TABS Take 10 mg by mouth at bedtime as needed.    Marland Kitchen omeprazole (PRILOSEC) 20 MG capsule Take 20 mg by mouth 2 (two) times daily before a meal.     No current facility-administered medications for this visit.     PHYSICAL EXAMINATION: ECOG PERFORMANCE STATUS: 1 - Symptomatic but completely ambulatory  Vitals:   03/16/17 0943  BP: (!) 150/69  Pulse: 93  Resp: 20  Temp: 98.7 F (37.1 C)   Filed Weights   03/16/17 0943  Weight: 174 lb 9.6 oz (79.2 kg)    GENERAL:alert, no distress and comfortable SKIN: skin color, texture, turgor are normal, no rashes or significant lesions EYES: normal, Conjunctiva are pink and non-injected, sclera clear OROPHARYNX:no exudate, no erythema and lips, buccal mucosa, and tongue normal  NECK: supple, thyroid normal size, non-tender, without nodularity LYMPH:  no palpable lymphadenopathy in the cervical, axillary or inguinal LUNGS: clear to auscultation and percussion with normal breathing effort HEART: regular rate & rhythm and no murmurs and no lower extremity edema ABDOMEN:abdomen soft, non-tender and normal bowel sounds Musculoskeletal:no cyanosis of digits and no clubbing  NEURO: alert & oriented x 3 with fluent speech, no focal motor/sensory deficits  LABORATORY DATA:  I have reviewed the data as listed    Component Value Date/Time   NA 139 03/16/2017 0920   K 4.1 03/16/2017 0920   CL 98 06/02/2014 1350   CO2 27 03/16/2017 0920   GLUCOSE 91 03/16/2017 0920   BUN 12.7 03/16/2017 0920   CREATININE 0.9 03/16/2017 0920   CALCIUM 9.2 03/16/2017 0920   PROT 6.7 03/16/2017 0920   ALBUMIN 3.7 03/16/2017 0920   AST 23 03/16/2017 0920   ALT 26 03/16/2017 0920   ALKPHOS 59 03/16/2017 0920   BILITOT 0.49 03/16/2017 0920   GFRNONAA 90 (L) 06/02/2014 1350   GFRAA >90 06/02/2014 1350    No  results found for: SPEP, UPEP  Lab Results  Component Value Date   WBC 5.1 03/16/2017   NEUTROABS 3.4 03/16/2017   HGB 13.3 03/16/2017   HCT 40.6 03/16/2017   MCV 91.9 03/16/2017   PLT 218 03/16/2017      Chemistry      Component Value Date/Time   NA 139 03/16/2017 0920   K 4.1 03/16/2017 0920   CL 98 06/02/2014 1350   CO2 27 03/16/2017 0920   BUN 12.7 03/16/2017 0920   CREATININE 0.9 03/16/2017 0920      Component Value Date/Time   CALCIUM 9.2 03/16/2017 0920   ALKPHOS 59 03/16/2017 0920   AST 23 03/16/2017 0920   ALT 26 03/16/2017 0920   BILITOT 0.49 03/16/2017 0920       ASSESSMENT & PLAN:  Follicular lymphoma grade iiia, lymph nodes of multiple sites ([place]) Clinically, he has complete response to treatment. Clinically, he had no signs of cancer I recommend  observation with history, physical examination and blood work every 6 months I recommend port removal  Essential hypertension His blood pressure remain mildly elevated. He will continue lisinopril. I recommend dietary modification and exercise. I recommend close follow-up with primary care doctor for medication adjustment  Other fatigue He continued to have chronic fatigue, along with recent weight gain and mood changes, recommend TSH in his next visit He agreed   Orders Placed This Encounter  Procedures  . IR Removal Tun Access W/ Port W/O FL    Standing Status:   Future    Standing Expiration Date:   05/16/2018    Order Specific Question:   Reason for exam:    Answer:   no need port    Order Specific Question:   Preferred Imaging Location?    Answer:   [facility]  . CBC with Differential/Platelet    Standing Status:   Future    Standing Expiration Date:   04/20/2018  . Comprehensive metabolic panel    Standing Status:   Future    Standing Expiration Date:   04/20/2018  . TSH    Standing Status:   Future    Standing Expiration Date:   04/20/2018   All questions were answered. The  patient knows to call the clinic with any problems, questions or concerns. No barriers to learning was detected. I spent 15 minutes counseling the patient face to face. The total time spent in the appointment was 20 minutes and more than 50% was on counseling and review of test results     Heath Lark, MD 03/16/2017 4:36 PM

## 2017-03-16 NOTE — Assessment & Plan Note (Addendum)
Clinically, he has complete response to treatment. Clinically, he had no signs of cancer I recommend observation with history, physical examination and blood work every 6 months I recommend port removal

## 2017-03-16 NOTE — Telephone Encounter (Signed)
Scheduled appt per 6/14 los - Gave patient AVS and calender.  

## 2017-03-16 NOTE — Assessment & Plan Note (Signed)
His blood pressure remain mildly elevated. He will continue lisinopril. I recommend dietary modification and exercise. I recommend close follow-up with primary care doctor for medication adjustment

## 2017-03-17 ENCOUNTER — Other Ambulatory Visit: Payer: Self-pay

## 2017-03-17 MED ORDER — LISINOPRIL 10 MG PO TABS: 10.0000 mg | ORAL_TABLET | Freq: Every day | ORAL | 0 refills | Status: DC

## 2017-06-30 ENCOUNTER — Telehealth: Payer: Self-pay

## 2017-06-30 NOTE — Telephone Encounter (Signed)
Called and left message for patient to call nurse back. Left message that we have a Rx request for Lisinopril from [facility]. Ask who is his PCP and who is following him for his hypertension.

## 2017-07-19 ENCOUNTER — Telehealth: Payer: Self-pay | Admitting: *Deleted

## 2017-07-19 NOTE — Telephone Encounter (Signed)
Spoke with Erik Crosby regarding PCP and lisinopril. He says Erik Crosby is the only one who was addressing his BP/lisinopril. He has been out for a few weeks. BP range 140/78-140/85. He will get re-established with [facility], but not sure how long it will take to get an appt. Next appt with Erik Crosby is December 14.

## 2017-07-20 ENCOUNTER — Other Ambulatory Visit: Payer: Self-pay

## 2017-07-20 MED ORDER — LISINOPRIL 10 MG PO TABS: 10.0000 mg | ORAL_TABLET | Freq: Every day | ORAL | 0 refills | Status: DC

## 2017-07-20 NOTE — Telephone Encounter (Signed)
Please proceed to refill Lisinopril for 60 days, no refills

## 2017-09-15 ENCOUNTER — Telehealth: Payer: Self-pay | Admitting: Hematology and Oncology

## 2017-09-15 ENCOUNTER — Other Ambulatory Visit (HOSPITAL_BASED_OUTPATIENT_CLINIC_OR_DEPARTMENT_OTHER): Payer: BLUE CROSS/BLUE SHIELD

## 2017-09-15 ENCOUNTER — Encounter: Payer: Self-pay | Admitting: Hematology and Oncology

## 2017-09-15 ENCOUNTER — Ambulatory Visit (HOSPITAL_BASED_OUTPATIENT_CLINIC_OR_DEPARTMENT_OTHER): Payer: BLUE CROSS/BLUE SHIELD | Admitting: Hematology and Oncology

## 2017-09-15 VITALS — BP 126/78 | HR 89 | Temp 98.7°F | Resp 16 | Ht 70.0 in | Wt 173.7 lb

## 2017-09-15 DIAGNOSIS — K2270 Barrett's esophagus without dysplasia: Secondary | ICD-10-CM | POA: Diagnosis not present

## 2017-09-15 DIAGNOSIS — Z23 Encounter for immunization: Secondary | ICD-10-CM | POA: Diagnosis not present

## 2017-09-15 DIAGNOSIS — I10 Essential (primary) hypertension: Secondary | ICD-10-CM | POA: Diagnosis not present

## 2017-09-15 DIAGNOSIS — R5383 Other fatigue: Secondary | ICD-10-CM

## 2017-09-15 DIAGNOSIS — C8238 Follicular lymphoma grade IIIa, lymph nodes of multiple sites: Secondary | ICD-10-CM | POA: Diagnosis not present

## 2017-09-15 LAB — CBC WITH DIFFERENTIAL/PLATELET
BASO%: 0.2 % (ref 0.0–2.0)
Basophils Absolute: 0 10*3/uL (ref 0.0–0.1)
EOS%: 1.5 % (ref 0.0–7.0)
Eosinophils Absolute: 0.1 10*3/uL (ref 0.0–0.5)
HCT: 43 % (ref 38.4–49.9)
HGB: 13.8 g/dL (ref 13.0–17.1)
LYMPH%: 26 % (ref 14.0–49.0)
MCH: 28.9 pg (ref 27.2–33.4)
MCHC: 32.1 g/dL (ref 32.0–36.0)
MCV: 90.1 fL (ref 79.3–98.0)
MONO#: 0.4 10*3/uL (ref 0.1–0.9)
MONO%: 10.6 % (ref 0.0–14.0)
NEUT#: 2.5 10*3/uL (ref 1.5–6.5)
NEUT%: 61.7 % (ref 39.0–75.0)
Platelets: 240 10*3/uL (ref 140–400)
RBC: 4.77 10*6/uL (ref 4.20–5.82)
RDW: 13.9 % (ref 11.0–14.6)
WBC: 4.1 10*3/uL (ref 4.0–10.3)
lymph#: 1.1 10*3/uL (ref 0.9–3.3)

## 2017-09-15 LAB — COMPREHENSIVE METABOLIC PANEL
ALT: 20 U/L (ref 0–55)
AST: 20 U/L (ref 5–34)
Albumin: 4.1 g/dL (ref 3.5–5.0)
Alkaline Phosphatase: 49 U/L (ref 40–150)
Anion Gap: 10 mEq/L (ref 3–11)
BUN: 12.1 mg/dL (ref 7.0–26.0)
CO2: 25 mEq/L (ref 22–29)
Calcium: 9.2 mg/dL (ref 8.4–10.4)
Chloride: 103 mEq/L (ref 98–109)
Creatinine: 0.9 mg/dL (ref 0.7–1.3)
EGFR: >60 mL/min/{1.73_m2} (ref >60)
Glucose: 91 mg/dl (ref 70–140)
Potassium: 4.3 mEq/L (ref 3.5–5.1)
Sodium: 137 mEq/L (ref 136–145)
Total Bilirubin: 0.44 mg/dL (ref 0.20–1.20)
Total Protein: 6.9 g/dL (ref 6.4–8.3)

## 2017-09-15 LAB — TSH: TSH: 0.839 m(IU)/L (ref 0.320–4.118)

## 2017-09-15 MED ORDER — INFLUENZA VAC SPLIT QUAD 0.5 ML IM SUSY
0.5000 mL | PREFILLED_SYRINGE | Freq: Once | INTRAMUSCULAR | Status: AC
  Administered 2017-09-15: 0.5 mL via INTRAMUSCULAR
  Filled 2017-09-15: qty 0.5

## 2017-09-15 MED ORDER — LISINOPRIL 10 MG PO TABS: 10.0000 mg | ORAL_TABLET | Freq: Every day | ORAL | 0 refills | Status: DC

## 2017-09-15 NOTE — Telephone Encounter (Signed)
Scheduled appt per 12/14 los - patient aware of appts - did not want AVS or calender printed

## 2017-09-15 NOTE — Assessment & Plan Note (Addendum)
Clinically, he has complete response to treatment. Clinically, he had no signs of cancer I recommend observation with history, physical examination and blood work every 6 months I recommend port removal We discussed the importance of preventive care and reviewed the vaccination programs. He does not have any prior allergic reactions to influenza vaccination. He agrees to proceed with influenza vaccination today and we will administer it today at the clinic.

## 2017-09-15 NOTE — Patient Instructions (Signed)
Preventing Influenza, Adult Influenza, more commonly known as "the flu," is a viral infection that mainly affects the respiratory tract. The respiratory tract includes structures that help you breathe, such as the lungs, nose, and throat. The flu causes many common cold symptoms, as well as a high fever and body aches. The flu spreads easily from person to person (is contagious). The flu is most common from December through March. This is called flu season.You can catch the flu virus by:  Breathing in droplets from an infected person's cough or sneeze.  Touching something that was recently contaminated with the virus and then touching your mouth, nose, or eyes.  What can I do to lower my risk? You can decrease your risk of getting the flu by:  Getting a flu shot (influenza vaccination) every year. This is the best way to prevent the flu. A flu shot is recommended for everyone age 6 months and older. ? It is best to get a flu shot in the fall, as soon as it is available. Getting a flu shot during winter or spring instead is still a good idea. Flu season can last into early spring. ? Preventing the flu through vaccination requires getting a new flu shot every year. This is because the flu virus changes slightly (mutates) from one year to the next. Even if a flu shot does not completely protect you from all flu virus mutations, it can reduce the severity of your illness and prevent dangerous complications of the flu. ? If you are pregnant, you can and should get a flu shot. ? If you have had a reaction to the shot in the past or if you are allergic to eggs, check with your health care provider before getting a flu shot. ? Sometimes the vaccine is available as a nasal spray. In some years, the nasal spray has not been as effective against the flu virus. Check with your health care provider if you have questions about this.  Practicing good health habits. This is especially important during flu  season. ? Avoid contact with people who are sick with flu or cold symptoms. ? Wash your hands with soap and water often. If soap and water are not available, use hand sanitizer. ? Avoid touching your hands to your face, especially when you have not washed your hands recently. ? Use a disinfectant to clean surfaces at home and at work that may be contaminated with the flu virus. ? Keep your body's disease-fighting system (immune system) in good shape by eating a healthy diet, drinking plenty of fluids, getting enough sleep, and exercising regularly.  If you do get the flu, avoid spreading it to others by:  Staying home until your symptoms have been gone for at least one day.  Covering your mouth and nose with your elbow when you cough or sneeze.  Avoiding close contact with others, especially babies and elderly people.  Why are these changes important? Getting a flu shot and practicing good health habits protects you as well as other people. If you get the flu, your friends, family, and co-workers are also at risk of getting it, because it spreads so easily to others. Each year, about 2 out of every 10 people get the flu. Having the flu can lead to complications, such as pneumonia, ear infection, and sinus infection. The flu also can be deadly, especially for babies, people older than age 65, and people who have serious long-term diseases. How is this treated? Most people recover   from the flu by resting at home and drinking plenty of fluids. However, a prescription antiviral medicine may reduce your flu symptoms and may make your flu go away sooner. This medicine must be started within a few days of getting flu symptoms. You can talk with your health care provider about whether you need an antiviral medicine. Antiviral medicine may be prescribed for people who are at risk for more serious flu symptoms. This includes people who:  Are older than age 65.  Are pregnant.  Have a condition that  makes the flu worse or more dangerous.  Where to find more information:  Centers for Disease Control and Prevention: www.cdc.gov/flu/index.htm  Flu.gov: www.flu.gov/prevention-vaccination  American Academy of Family [employer]icians: familydoctor.org/familydoctor/en/kids/vaccines/preventing-the-flu.html Contact a health care provider if:  You have influenza and you develop new symptoms.  You have: ? Chest pain. ? Diarrhea. ? A fever.  Your cough gets worse, or you produce more mucus. Summary  The best way to prevent the flu is to get a flu shot every year in the fall.  Even if you get the flu after you have received the yearly vaccine, your flu may be milder and go away sooner because of your flu shot.  If you get the flu, antiviral medicines that are started with a few days of symptoms may reduce your flu symptoms and may make your flu go away sooner.  You can also help prevent the flu by practicing good health habits. This information is not intended to replace advice given to you by your health care provider. Make sure you discuss any questions you have with your health care provider. Document Released: 10/04/2015 Document Revised: 05/28/2016 Document Reviewed: 05/28/2016 Elsevier Interactive Patient Education  2018 Elsevier Inc.  

## 2017-09-15 NOTE — Assessment & Plan Note (Signed)
His blood pressure is well controlled He will continue lisinopril. I recommend dietary modification and exercise. I recommend close follow-up with primary care doctor for medication adjustment

## 2017-09-15 NOTE — Assessment & Plan Note (Signed)
This was discovered by gastroenterologist. He is currently on proton pump inhibitor and is doing well. We discussed dietary modification

## 2017-09-15 NOTE — Progress Notes (Signed)
[facility] OFFICE PROGRESS NOTE  Patient Care Team: Christain Sacramento, MD as PCP - General (Family Medicine) Jackolyn Confer, MD as Referring Physician (General Surgery) Heath Lark, MD as Consulting Physician (Hematology and Oncology) Carol Ada, MD as Consulting Physician (Gastroenterology)  SUMMARY OF ONCOLOGIC HISTORY: Oncology History   Follicular lymphoma grade 3a   Primary site: Lymphoid Neoplasms   Staging method: AJCC 6th Edition   Clinical free text: Bone marrow is involved on PET; Possible component of DLBCL   Clinical: Stage IV signed by Heath Lark, MD on 06/18/2014  8:05 PM   Summary: Stage IV       Follicular lymphoma grade iiia, lymph nodes of multiple sites ([place])   05/13/2014 Imaging    CT scan of chest and neck showed extensive lymphadenopathy in the neck and medistinum      05/19/2014 Procedure    [id number] US biopsy only showed necrotic tissue      06/03/2014 Surgery    Excisional LN biopsy of right supraclavicular region showed follicular lymphoma grade 3 [id number]      06/14/2014 Imaging    PET CT scan showed diffuse, bulky lymphadenopathy, splenic involvement and bone marrow disease      06/16/2014 Imaging    ECHO showed preserved EF 50%      06/17/2014 Procedure    He has port placement      06/20/2014 - 09/12/2014 Chemotherapy    He received 5 cycles of chemotherapy with R. CHOP along with intrathecal methotrexate. He received 2 doses of IT MTX. He did not receive the final cycle of CHOP due to side effects      08/20/2014 Imaging    Repeat PET/CT scan showed significant improvement with near-complete response to treatment.      10/24/2014 Imaging    Repeat PET CT scan showed near complete resolution of lymphadenopathy      10/31/2014 - 09/02/2016 Chemotherapy    He received maintenance Rituxan every 60 days for 2 years      03/03/2015 Imaging    Repeat PET CT showed no evidence of active disease      09/29/2015 Procedure     He underwent endoscopy which showed Barrett's esophagus and angiodysplasia      03/03/2016 PET scan    No evidence of lymphoma. Diffuse increase metabolic activity in the stomach. Patient had an upper endoscopy in December 2016 without evidence of malignancy. Therefore, favor findings to represent gastritis      09/13/2016 PET scan    No findings suspicious for lymphoma. Specifically, no suspicious left inguinal lymphadenopathy.         INTERVAL HISTORY: Please see below for problem oriented charting. He returns for further follow-up He denies new lymphadenopathy No recent infection He is due for influenza vaccination Appetite is stable, no recent weight loss or abnormal night sweats  REVIEW OF SYSTEMS:   Constitutional: Denies fevers, chills or abnormal weight loss Eyes: Denies blurriness of vision Ears, nose, mouth, throat, and face: Denies mucositis or sore throat Respiratory: Denies cough, dyspnea or wheezes Cardiovascular: Denies palpitation, chest discomfort or lower extremity swelling Gastrointestinal:  Denies nausea, heartburn or change in bowel habits Skin: Denies abnormal skin rashes Lymphatics: Denies new lymphadenopathy or easy bruising Neurological:Denies numbness, tingling or new weaknesses Behavioral/Psych: Mood is stable, no new changes  All other systems were reviewed with the patient and are negative.  I have reviewed the past medical history, past surgical history, social history and family history with the  patient and they are unchanged from previous note.  ALLERGIES:  has No Known Allergies.  MEDICATIONS:  Current Outpatient Medications  Medication Sig Dispense Refill  . cholecalciferol (VITAMIN D) 1000 UNITS tablet Take 2,000 Units by mouth daily.     Marland Kitchen ibuprofen (ADVIL,MOTRIN) 200 MG tablet Take 200 mg by mouth every 6 (six) hours as needed.    Marland Kitchen lisinopril (PRINIVIL,ZESTRIL) 10 MG tablet Take 1 tablet (10 mg total) by mouth daily. 90 tablet 0   . Melatonin 5 MG TABS Take 10 mg by mouth at bedtime as needed.    Marland Kitchen omeprazole (PRILOSEC) 20 MG capsule Take 20 mg by mouth 2 (two) times daily before a meal.     No current facility-administered medications for this visit.     PHYSICAL EXAMINATION: ECOG PERFORMANCE STATUS: 0 - Asymptomatic  Vitals:   09/15/17 0950  BP: 126/78  Pulse: 89  Resp: 16  Temp: 98.7 F (37.1 C)  SpO2: 100%   Filed Weights   09/15/17 0950  Weight: 173 lb 11.2 oz (78.8 kg)    GENERAL:alert, no distress and comfortable SKIN: skin color, texture, turgor are normal, no rashes or significant lesions EYES: normal, Conjunctiva are pink and non-injected, sclera clear OROPHARYNX:no exudate, no erythema and lips, buccal mucosa, and tongue normal  NECK: supple, thyroid normal size, non-tender, without nodularity LYMPH:  no palpable lymphadenopathy in the cervical, axillary or inguinal LUNGS: clear to auscultation and percussion with normal breathing effort HEART: regular rate & rhythm and no murmurs and no lower extremity edema ABDOMEN:abdomen soft, non-tender and normal bowel sounds Musculoskeletal:no cyanosis of digits and no clubbing  NEURO: alert & oriented x 3 with fluent speech, no focal motor/sensory deficits  LABORATORY DATA:  I have reviewed the data as listed    Component Value Date/Time   NA 137 09/15/2017 0935   K 4.3 09/15/2017 0935   CL 98 06/02/2014 1350   CO2 25 09/15/2017 0935   GLUCOSE 91 09/15/2017 0935   BUN 12.1 09/15/2017 0935   CREATININE 0.9 09/15/2017 0935   CALCIUM 9.2 09/15/2017 0935   PROT 6.9 09/15/2017 0935   ALBUMIN 4.1 09/15/2017 0935   AST 20 09/15/2017 0935   ALT 20 09/15/2017 0935   ALKPHOS 49 09/15/2017 0935   BILITOT 0.44 09/15/2017 0935   GFRNONAA 90 (L) 06/02/2014 1350   GFRAA >90 06/02/2014 1350    No results found for: SPEP, UPEP  Lab Results  Component Value Date   WBC 4.1 09/15/2017   NEUTROABS 2.5 09/15/2017   HGB 13.8 09/15/2017   HCT 43.0  09/15/2017   MCV 90.1 09/15/2017   PLT 240 09/15/2017      Chemistry      Component Value Date/Time   NA 137 09/15/2017 0935   K 4.3 09/15/2017 0935   CL 98 06/02/2014 1350   CO2 25 09/15/2017 0935   BUN 12.1 09/15/2017 0935   CREATININE 0.9 09/15/2017 0935      Component Value Date/Time   CALCIUM 9.2 09/15/2017 0935   ALKPHOS 49 09/15/2017 0935   AST 20 09/15/2017 0935   ALT 20 09/15/2017 0935   BILITOT 0.44 09/15/2017 0935      ASSESSMENT & PLAN:  Follicular lymphoma grade iiia, lymph nodes of multiple sites ([place]) Clinically, he has complete response to treatment. Clinically, he had no signs of cancer I recommend observation with history, physical examination and blood work every 6 months I recommend port removal We discussed the importance of preventive care and  reviewed the vaccination programs. He does not have any prior allergic reactions to influenza vaccination. He agrees to proceed with influenza vaccination today and we will administer it today at the clinic.   Barrett esophagus  This was discovered by gastroenterologist. He is currently on proton pump inhibitor and is doing well. We discussed dietary modification   Essential hypertension His blood pressure is well controlled He will continue lisinopril. I recommend dietary modification and exercise. I recommend close follow-up with primary care doctor for medication adjustment   No orders of the defined types were placed in this encounter.  All questions were answered. The patient knows to call the clinic with any problems, questions or concerns. No barriers to learning was detected. I spent 15 minutes counseling the patient face to face. The total time spent in the appointment was 20 minutes and more than 50% was on counseling and review of test results     Heath Lark, MD 09/15/2017 5:17 PM

## 2017-12-17 ENCOUNTER — Other Ambulatory Visit: Payer: Self-pay | Admitting: Hematology and Oncology

## 2018-03-16 ENCOUNTER — Telehealth: Payer: Self-pay | Admitting: Hematology and Oncology

## 2018-03-16 ENCOUNTER — Encounter: Payer: Self-pay | Admitting: Hematology and Oncology

## 2018-03-16 ENCOUNTER — Inpatient Hospital Stay: Payer: BLUE CROSS/BLUE SHIELD | Attending: Hematology and Oncology | Admitting: Hematology and Oncology

## 2018-03-16 ENCOUNTER — Other Ambulatory Visit: Payer: Self-pay | Admitting: Hematology and Oncology

## 2018-03-16 ENCOUNTER — Inpatient Hospital Stay: Payer: BLUE CROSS/BLUE SHIELD

## 2018-03-16 VITALS — BP 124/81 | HR 72 | Temp 98.7°F | Resp 18 | Ht 70.0 in | Wt 165.2 lb

## 2018-03-16 DIAGNOSIS — C8238 Follicular lymphoma grade IIIa, lymph nodes of multiple sites: Secondary | ICD-10-CM

## 2018-03-16 DIAGNOSIS — K2270 Barrett's esophagus without dysplasia: Secondary | ICD-10-CM | POA: Insufficient documentation

## 2018-03-16 DIAGNOSIS — Z8572 Personal history of non-Hodgkin lymphomas: Secondary | ICD-10-CM | POA: Diagnosis not present

## 2018-03-16 DIAGNOSIS — R634 Abnormal weight loss: Secondary | ICD-10-CM | POA: Insufficient documentation

## 2018-03-16 DIAGNOSIS — D72819 Decreased white blood cell count, unspecified: Secondary | ICD-10-CM | POA: Insufficient documentation

## 2018-03-16 LAB — COMPREHENSIVE METABOLIC PANEL
ALT: 16 U/L (ref 0–55)
AST: 19 U/L (ref 5–34)
Albumin: 4.2 g/dL (ref 3.5–5.0)
Alkaline Phosphatase: 52 U/L (ref 40–150)
Anion gap: 10 (ref 3–11)
BUN: 14 mg/dL (ref 7–26)
CO2: 27 mmol/L (ref 22–29)
Calcium: 9.3 mg/dL (ref 8.4–10.4)
Chloride: 102 mmol/L (ref 98–109)
Creatinine, Ser: 0.92 mg/dL (ref 0.70–1.30)
GFR calc Af Amer: >60 mL/min (ref >60)
GFR calc non Af Amer: >60 mL/min (ref >60)
Glucose, Bld: 86 mg/dL (ref 70–140)
Potassium: 4.1 mmol/L (ref 3.5–5.1)
Sodium: 139 mmol/L (ref 136–145)
Total Bilirubin: 0.4 mg/dL (ref 0.2–1.2)
Total Protein: 7 g/dL (ref 6.4–8.3)

## 2018-03-16 LAB — CBC WITH DIFFERENTIAL/PLATELET
Basophils Absolute: 0 10*3/uL (ref 0.0–0.1)
Basophils Relative: 1 %
Eosinophils Absolute: 0.1 10*3/uL (ref 0.0–0.5)
Eosinophils Relative: 2 %
HCT: 43.7 % (ref 38.4–49.9)
Hemoglobin: 13.9 g/dL (ref 13.0–17.1)
Lymphocytes Relative: 27 %
Lymphs Abs: 1 10*3/uL (ref 0.9–3.3)
MCH: 29.1 pg (ref 27.2–33.4)
MCHC: 31.8 g/dL — ABNORMAL LOW (ref 32.0–36.0)
MCV: 91.6 fL (ref 79.3–98.0)
Monocytes Absolute: 0.4 10*3/uL (ref 0.1–0.9)
Monocytes Relative: 10 %
Neutro Abs: 2.3 10*3/uL (ref 1.5–6.5)
Neutrophils Relative %: 60 %
Platelets: 215 10*3/uL (ref 140–400)
RBC: 4.77 MIL/uL (ref 4.20–5.82)
RDW: 13.1 % (ref 11.0–14.6)
WBC: 3.8 10*3/uL — ABNORMAL LOW (ref 4.0–10.3)

## 2018-03-16 LAB — LACTATE DEHYDROGENASE: LDH: 192 U/L (ref 125–245)

## 2018-03-16 NOTE — Assessment & Plan Note (Signed)
I am concerned about unexplained weight loss He had mild leukopenia and abdominal hernia I recommend CT scan of the chest, abdomen and pelvis to exclude cancer recurrence and he agreed If CT scan is negative, I will get his port removed

## 2018-03-16 NOTE — Assessment & Plan Note (Signed)
He has unexplained weight loss since the last time I saw him His appetite is reduced I am concerned about cancer recurrence and recommend imaging study and he agreed to proceed

## 2018-03-16 NOTE — Progress Notes (Signed)
[facility] OFFICE PROGRESS NOTE  Patient Care Team: Christain Sacramento, MD as PCP - General (Family Medicine) Jackolyn Confer, MD as Referring Physician (General Surgery) Heath Lark, MD as Consulting Physician (Hematology and Oncology) Carol Ada, MD as Consulting Physician (Gastroenterology)  ASSESSMENT & PLAN:  Follicular lymphoma grade iiia, lymph nodes of multiple sites [facility]) I am concerned about unexplained weight loss He had mild leukopenia and abdominal hernia I recommend CT scan of the chest, abdomen and pelvis to exclude cancer recurrence and he agreed If CT scan is negative, I will get his port removed  Chronic leukopenia He has leukopenia of unknown etiology with associated weight loss I plan CT imaging to exclude cancer recurrence and he agreed to proceed  Weight loss He has unexplained weight loss since the last time I saw him His appetite is reduced I am concerned about cancer recurrence and recommend imaging study and he agreed to proceed   Orders Placed This Encounter  Procedures  . CT CHEST W CONTRAST    Standing Status:   Future    Standing Expiration Date:   03/17/2019    Order Specific Question:   If indicated for the ordered procedure, I authorize the administration of contrast media per Radiology protocol    Answer:   Yes    Order Specific Question:   Preferred imaging location?    Answer:   [facility]    Order Specific Question:   Radiology Contrast Protocol - do NOT remove file path    Answer:   \\[email or web address]  . CT ABDOMEN PELVIS W CONTRAST    Standing Status:   Future    Standing Expiration Date:   03/17/2019    Order Specific Question:   If indicated for the ordered procedure, I authorize the administration of contrast media per Radiology protocol    Answer:   Yes    Order Specific Question:   Preferred imaging location?    Answer:   [facility]    Order Specific Question:   Radiology  Contrast Protocol - do NOT remove file path    Answer:   \\[email or web address]    INTERVAL HISTORY: Please see below for problem oriented charting. He returns for further follow-up He denies new lymphadenopathy He has some reduced appetite and 10 pound weight loss since last time I saw him He also have abdominal hernia but no imaging study has been repeated recently He denies recent infection  SUMMARY OF ONCOLOGIC HISTORY: Oncology History   Follicular lymphoma grade 3a   Primary site: Lymphoid Neoplasms   Staging method: AJCC 6th Edition   Clinical free text: Bone marrow is involved on PET; Possible component of DLBCL   Clinical: Stage IV signed by Heath Lark, MD on 06/18/2014  8:05 PM   Summary: Stage IV       Follicular lymphoma grade iiia, lymph nodes of multiple sites ([place])   05/13/2014 Imaging    CT scan of chest and neck showed extensive lymphadenopathy in the neck and medistinum      05/19/2014 Procedure    [id number] US biopsy only showed necrotic tissue      06/03/2014 Surgery    Excisional LN biopsy of right supraclavicular region showed follicular lymphoma grade 3 [id number]      06/14/2014 Imaging    PET CT scan showed diffuse, bulky lymphadenopathy, splenic involvement and bone marrow disease      06/16/2014 Imaging    ECHO showed preserved EF 50%  06/17/2014 Procedure    He has port placement      06/20/2014 - 09/12/2014 Chemotherapy    He received 5 cycles of chemotherapy with R. CHOP along with intrathecal methotrexate. He received 2 doses of IT MTX. He did not receive the final cycle of CHOP due to side effects      08/20/2014 Imaging    Repeat PET/CT scan showed significant improvement with near-complete response to treatment.      10/24/2014 Imaging    Repeat PET CT scan showed near complete resolution of lymphadenopathy      10/31/2014 - 09/02/2016 Chemotherapy    He received maintenance Rituxan every 60 days for 2  years      03/03/2015 Imaging    Repeat PET CT showed no evidence of active disease      09/29/2015 Procedure    He underwent endoscopy which showed Barrett's esophagus and angiodysplasia      03/03/2016 PET scan    No evidence of lymphoma. Diffuse increase metabolic activity in the stomach. Patient had an upper endoscopy in December 2016 without evidence of malignancy. Therefore, favor findings to represent gastritis      09/13/2016 PET scan    No findings suspicious for lymphoma. Specifically, no suspicious left inguinal lymphadenopathy.        03/08/2018 Procedure    EGD showed Barrett's esophagus       REVIEW OF SYSTEMS:   Constitutional: Denies fevers, chills Eyes: Denies blurriness of vision Ears, nose, mouth, throat, and face: Denies mucositis or sore throat Respiratory: Denies cough, dyspnea or wheezes Cardiovascular: Denies palpitation, chest discomfort or lower extremity swelling Gastrointestinal:  Denies nausea, heartburn or change in bowel habits Skin: Denies abnormal skin rashes Lymphatics: Denies new lymphadenopathy or easy bruising Neurological:Denies numbness, tingling or new weaknesses Behavioral/Psych: Mood is stable, no new changes  All other systems were reviewed with the patient and are negative.  I have reviewed the past medical history, past surgical history, social history and family history with the patient and they are unchanged from previous note.  ALLERGIES:  has No Known Allergies.  MEDICATIONS:  Current Outpatient Medications  Medication Sig Dispense Refill  . cholecalciferol (VITAMIN D) 1000 UNITS tablet Take 2,000 Units by mouth daily.     Marland Kitchen ibuprofen (ADVIL,MOTRIN) 200 MG tablet Take 200 mg by mouth every 6 (six) hours as needed.    Marland Kitchen lisinopril (PRINIVIL,ZESTRIL) 10 MG tablet Take 1 tablet (10 mg total) by mouth daily. 90 tablet 0  . Melatonin 5 MG TABS Take 10 mg by mouth at bedtime as needed.    Marland Kitchen omeprazole (PRILOSEC) 20 MG capsule  Take 20 mg by mouth 2 (two) times daily before a meal.     No current facility-administered medications for this visit.     PHYSICAL EXAMINATION: ECOG PERFORMANCE STATUS: 1 - Symptomatic but completely ambulatory  Vitals:   03/16/18 1036  BP: 124/81  Pulse: 72  Resp: 18  Temp: 98.7 F (37.1 C)  SpO2: 100%   Filed Weights   03/16/18 1036  Weight: 165 lb 3.2 oz (74.9 kg)    GENERAL:alert, no distress and comfortable SKIN: skin color, texture, turgor are normal, no rashes or significant lesions EYES: normal, Conjunctiva are pink and non-injected, sclera clear OROPHARYNX:no exudate, no erythema and lips, buccal mucosa, and tongue normal  NECK: supple, thyroid normal size, non-tender, without nodularity LYMPH:  no palpable lymphadenopathy in the cervical, axillary or inguinal LUNGS: clear to auscultation and percussion with normal breathing effort  HEART: regular rate & rhythm and no murmurs and no lower extremity edema ABDOMEN:abdomen soft, non-tender and normal bowel sounds Musculoskeletal:no cyanosis of digits and no clubbing  NEURO: alert & oriented x 3 with fluent speech, no focal motor/sensory deficits  LABORATORY DATA:  I have reviewed the data as listed    Component Value Date/Time   NA 139 03/16/2018 0952   NA 137 09/15/2017 0935   K 4.1 03/16/2018 0952   K 4.3 09/15/2017 0935   CL 102 03/16/2018 0952   CO2 27 03/16/2018 0952   CO2 25 09/15/2017 0935   GLUCOSE 86 03/16/2018 0952   GLUCOSE 91 09/15/2017 0935   BUN 14 03/16/2018 0952   BUN 12.1 09/15/2017 0935   CREATININE 0.92 03/16/2018 0952   CREATININE 0.9 09/15/2017 0935   CALCIUM 9.3 03/16/2018 0952   CALCIUM 9.2 09/15/2017 0935   PROT 7.0 03/16/2018 0952   PROT 6.9 09/15/2017 0935   ALBUMIN 4.2 03/16/2018 0952   ALBUMIN 4.1 09/15/2017 0935   AST 19 03/16/2018 0952   AST 20 09/15/2017 0935   ALT 16 03/16/2018 0952   ALT 20 09/15/2017 0935   ALKPHOS 52 03/16/2018 0952   ALKPHOS 49 09/15/2017 0935    BILITOT 0.4 03/16/2018 0952   BILITOT 0.44 09/15/2017 0935   GFRNONAA >60 03/16/2018 0952   GFRAA >60 03/16/2018 0952    No results found for: SPEP, UPEP  Lab Results  Component Value Date   WBC 3.8 (L) 03/16/2018   NEUTROABS 2.3 03/16/2018   HGB 13.9 03/16/2018   HCT 43.7 03/16/2018   MCV 91.6 03/16/2018   PLT 215 03/16/2018      Chemistry      Component Value Date/Time   NA 139 03/16/2018 0952   NA 137 09/15/2017 0935   K 4.1 03/16/2018 0952   K 4.3 09/15/2017 0935   CL 102 03/16/2018 0952   CO2 27 03/16/2018 0952   CO2 25 09/15/2017 0935   BUN 14 03/16/2018 0952   BUN 12.1 09/15/2017 0935   CREATININE 0.92 03/16/2018 0952   CREATININE 0.9 09/15/2017 0935      Component Value Date/Time   CALCIUM 9.3 03/16/2018 0952   CALCIUM 9.2 09/15/2017 0935   ALKPHOS 52 03/16/2018 0952   ALKPHOS 49 09/15/2017 0935   AST 19 03/16/2018 0952   AST 20 09/15/2017 0935   ALT 16 03/16/2018 0952   ALT 20 09/15/2017 0935   BILITOT 0.4 03/16/2018 0952   BILITOT 0.44 09/15/2017 0935      All questions were answered. The patient knows to call the clinic with any problems, questions or concerns. No barriers to learning was detected.  I spent 15 minutes counseling the patient face to face. The total time spent in the appointment was 20 minutes and more than 50% was on counseling and review of test results  Heath Lark, MD 03/16/2018 1:07 PM

## 2018-03-16 NOTE — Telephone Encounter (Signed)
Gave patient avs and calendar of upcoming June 2020 appointments.  °

## 2018-03-16 NOTE — Assessment & Plan Note (Signed)
He has leukopenia of unknown etiology with associated weight loss I plan CT imaging to exclude cancer recurrence and he agreed to proceed

## 2018-03-28 ENCOUNTER — Ambulatory Visit (HOSPITAL_COMMUNITY)
Admission: RE | Admit: 2018-03-28 | Discharge: 2018-03-28 | Disposition: A | Discharge status: Home / Self Care | Payer: BLUE CROSS/BLUE SHIELD | Source: Ambulatory Visit | Attending: Hematology and Oncology | Admitting: Hematology and Oncology

## 2018-03-28 DIAGNOSIS — I2510 Atherosclerotic heart disease of native coronary artery without angina pectoris: Secondary | ICD-10-CM | POA: Insufficient documentation

## 2018-03-28 DIAGNOSIS — K449 Diaphragmatic hernia without obstruction or gangrene: Secondary | ICD-10-CM | POA: Insufficient documentation

## 2018-03-28 DIAGNOSIS — D72819 Decreased white blood cell count, unspecified: Secondary | ICD-10-CM | POA: Diagnosis not present

## 2018-03-28 DIAGNOSIS — R634 Abnormal weight loss: Secondary | ICD-10-CM | POA: Diagnosis not present

## 2018-03-28 DIAGNOSIS — C8238 Follicular lymphoma grade IIIa, lymph nodes of multiple sites: Secondary | ICD-10-CM | POA: Diagnosis present

## 2018-03-28 DIAGNOSIS — I700 Atherosclerosis of aorta: Secondary | ICD-10-CM | POA: Insufficient documentation

## 2018-03-28 MED ORDER — IOPAMIDOL (ISOVUE-300) INJECTION 61%
100.0000 mL | Freq: Once | INTRAVENOUS | Status: AC | PRN
  Administered 2018-03-28: 100 mL via INTRAVENOUS

## 2018-03-28 MED ORDER — IOPAMIDOL (ISOVUE-300) INJECTION 61%
INTRAVENOUS | Status: AC
  Filled 2018-03-28: qty 100

## 2018-03-29 ENCOUNTER — Telehealth: Payer: Self-pay

## 2018-03-29 NOTE — Telephone Encounter (Signed)
Called and given below message. He verbalized understanding. 

## 2018-03-29 NOTE — Telephone Encounter (Signed)
-----   Message from Heath Lark, MD sent at 03/29/2018 12:12 PM EDT ----- Regarding: Ct negative Let him know CT is negative I recommend port removal. It was put in by general surgery Dr. Zella Richer. Please call his office to schedule port removal (unless patient does not want it removed, he has to call when he is ready to set it up)

## 2018-11-14 ENCOUNTER — Telehealth: Payer: Self-pay

## 2018-11-14 NOTE — Telephone Encounter (Signed)
NOTES ON FILE 

## 2018-11-23 ENCOUNTER — Encounter: Payer: Self-pay | Admitting: Cardiology

## 2018-12-12 ENCOUNTER — Encounter: Payer: Self-pay | Admitting: Cardiology

## 2018-12-12 ENCOUNTER — Other Ambulatory Visit: Payer: Self-pay

## 2018-12-12 ENCOUNTER — Ambulatory Visit: Payer: BLUE CROSS/BLUE SHIELD | Admitting: Cardiology

## 2018-12-12 ENCOUNTER — Encounter (INDEPENDENT_AMBULATORY_CARE_PROVIDER_SITE_OTHER): Payer: Self-pay

## 2018-12-12 VITALS — BP 128/80 | HR 92 | Ht 70.0 in | Wt 173.8 lb

## 2018-12-12 DIAGNOSIS — R079 Chest pain, unspecified: Secondary | ICD-10-CM | POA: Diagnosis not present

## 2018-12-12 DIAGNOSIS — I10 Essential (primary) hypertension: Secondary | ICD-10-CM | POA: Diagnosis not present

## 2018-12-12 DIAGNOSIS — R0609 Other forms of dyspnea: Secondary | ICD-10-CM

## 2018-12-12 DIAGNOSIS — R0600 Dyspnea, unspecified: Secondary | ICD-10-CM

## 2018-12-12 NOTE — Progress Notes (Signed)
Cardiology Office Note    Date:  12/12/2018   ID:  Erik Crosby, DOB Nov 15, 1955, MRN [id number]  PCP:  Christain Sacramento, MD  Cardiologist:  Fransico Him, MD   Chief Complaint  Patient presents with  . Chest Pain  . Shortness of Breath    History of Present Illness:  Erik Crosby is a 63 y.o. male who is being seen today for the evaluation of chest pain at the request of Jimmye Norman, Breejante J, *.  This is a 63 year old male with a history of hypertension, GERD with Barrett's esophagus and dysplasia who is referred by his PCP for evaluation of shortness of breath and chest pain.  Denies any history of tobacco use but does have a family history of CAD although at a later age in life.  His father died of an MI at 85 years of age.  He says that he has been having episodes of chest discomfort and shortness of breath.  He has a history of non-Hodgkin's lymphoma in the past and said it feels similar to what he had had at that time but he just had a follow-up with oncology and a CT scan that showed no evidence of disease.  He says his symptoms only come on with exertion and resolve with rest.  He says that the vague discomfort in his chest that it is really hard for him to describe.  Occasionally he will get diaphoretic with the episodes and they are also associated with intermittent left arm and hand tingling.  He will notice this when he is walking up stairs or hill and will get associated shortness of breath.  He denies any PND, orthopnea, lower extremity edema, palpitations, dizziness or syncope.  Past Medical History:  Diagnosis Date  . AK (actinic keratosis)   . Barrett's esophagus without dysplasia   . Bone pain 06/23/2014  . Cancer ([place])   . Constipation 07/11/2014  . Follicular lymphoma grade 3a ([place]) 06/12/2014  . GERD (gastroesophageal reflux disease)    occ-takes vinagar  . Headache 07/11/2014  . Hypertension   . Insomnia 06/18/2014  . Nausea with vomiting 08/22/2014  .  Other fatigue 03/04/2015  . Spinal headache 07/11/2014  . Wears glasses     Past Surgical History:  Procedure Laterality Date  . APPENDECTOMY    . COLONOSCOPY    . LYMPH NODE BIOPSY Right 06/03/2014   Procedure: RIGHT SUPRACLAVICULAR LYMPH NODE BIOPSY;  Surgeon: Odis Hollingshead, MD;  Location: [facility];  Service: General;  Laterality: Right;  . PORTACATH PLACEMENT N/A 06/17/2014   Procedure: ULTRASOUND GUIDED PORT-A-CATH INSERTION;  Surgeon: Jackolyn Confer, MD;  Location: [facility];  Service: General;  Laterality: N/A;    Current Medications: Current Meds  Medication Sig  . cholecalciferol (VITAMIN D) 1000 UNITS tablet Take 2,000 Units by mouth daily.   Marland Kitchen ibuprofen (ADVIL,MOTRIN) 200 MG tablet Take 200 mg by mouth every 6 (six) hours as needed.  Marland Kitchen lisinopril (PRINIVIL,ZESTRIL) 20 MG tablet Take 20 mg by mouth daily.  . Melatonin 5 MG TABS Take 10 mg by mouth at bedtime as needed.  Marland Kitchen omeprazole (PRILOSEC) 20 MG capsule Take 20 mg by mouth 2 (two) times daily before a meal.    Allergies:   Patient has no known allergies.   Social History   Socioeconomic History  . Marital status: Married    Spouse name: Not on file  . Number of children: Not on file  . Years of education:  Not on file  . Highest education level: Not on file  Occupational History  . Not on file  Social Needs  . Financial resource strain: Not on file  . Food insecurity:    Worry: Not on file    Inability: Not on file  . Transportation needs:    Medical: Not on file    Non-medical: Not on file  Tobacco Use  . Smoking status: Never Smoker  . Smokeless tobacco: Never Used  Substance and Sexual Activity  . Alcohol use: No  . Drug use: No  . Sexual activity: Not on file  Lifestyle  . Physical activity:    Days per week: Not on file    Minutes per session: Not on file  . Stress: Not on file  Relationships  . Social connections:    Talks on phone: Not on file    Gets together: Not on file     Attends religious service: Not on file    Active member of club or organization: Not on file    Attends meetings of clubs or organizations: Not on file    Relationship status: Not on file  Other Topics Concern  . Not on file  Social History Narrative  . Not on file     Family History:  The patient's family history includes Cancer in his mother; Colon cancer in an other family member; Diabetes in an other family member; Heart disease in his father; Hypertension in an other family member.   ROS:   Please see the history of present illness.    ROS All other systems reviewed and are negative.  No flowsheet data found.     PHYSICAL EXAM:   VS:  BP 128/80   Pulse 92   Ht 5\' 10"  (1.778 m)   Wt 173 lb 12.8 oz (78.8 kg)   SpO2 97%   BMI 24.94 kg/m    GEN: Well nourished, well developed, in no acute distress  HEENT: normal  Neck: no JVD, carotid bruits, or masses Cardiac: RRR; no murmurs, rubs, or gallops,no edema.  Intact distal pulses bilaterally.  Respiratory:  clear to auscultation bilaterally, normal work of breathing GI: soft, nontender, nondistended, + BS MS: no deformity or atrophy  Skin: warm and dry, no rash Neuro:  Alert and Oriented x 3, Strength and sensation are intact Psych: euthymic mood, full affect  Wt Readings from Last 3 Encounters:  12/12/18 173 lb 12.8 oz (78.8 kg)  03/16/18 165 lb 3.2 oz (74.9 kg)  09/15/17 173 lb 11.2 oz (78.8 kg)      Studies/Labs Reviewed:   EKG:  EKG is not ordered today.  The ekg ordered today demonstrates him with no ST changes.  Recent Labs: 03/16/2018: ALT 16; BUN 14; Creatinine, Ser 0.92; Hemoglobin 13.9; Platelets 215; Potassium 4.1; Sodium 139   Lipid Panel No results found for: CHOL, TRIG, HDL, CHOLHDL, VLDL, LDLCALC, LDLDIRECT  Additional studies/ records that were reviewed today include:  Office notes and EKG from PCP    ASSESSMENT:    1. Chest pain, unspecified type   2. DOE (dyspnea on exertion)   3.  Essential hypertension      PLAN:  In order of problems listed above:  1.  Chest pain - his chest pain is somewhat vague but is exertional and associated with intermittent left arm tingling and hand tingling as well as diaphoresis.  The pain is very sporadic but does improve with rest.  He has a family history of  CAD in his father who died at 4 of an MI but no premature CAD.  His EKG is nonischemic.  He did have coronary artery calcifications noted on a chest CT in follow-up last June for his history of non-Hodgkin's lymphoma.  I am going to get an exercise treadmill test to rule out ischemia as well as a coronary calcium score to assess future risk.  2.  Dyspnea on exertion - etiology of dyspnea on exertion is unclear at this time.  Most worrisome thing would be coronary ischemia.  He thinks he may be a little deconditioned.  Chest CT in June 2019 showed aortic atherosclerosis and coronary atherosclerosis but no findings of active malignancy in the chest or abdomen.  I will check a 2D echocardiogram to assess LV function.  3.  Hypertension - BP is controlled on exam today.  He will continue on lisinopril 20 mg daily.    Medication Adjustments/Labs and Tests Ordered: Current medicines are reviewed at length with the patient today.  Concerns regarding medicines are outlined above.  Medication changes, Labs and Tests ordered today are listed in the Patient Instructions below.  Patient Instructions  Medication Instructions:  Your physician recommends that you continue on your current medications as directed. Please refer to the Current Medication list given to you today.  If you need a refill on your cardiac medications before your next appointment, please call your pharmacy.   Lab work: None If you have labs (blood work) drawn today and your tests are completely normal, you will receive your results only by: Marland Kitchen MyChart Message (if you have MyChart) OR . A paper copy in the mail If you have  any lab test that is abnormal or we need to change your treatment, we will call you to review the results.  Testing/Procedures: Your physician has requested that you have an echocardiogram. Echocardiography is a painless test that uses sound waves to create images of your heart. It provides your doctor with information about the size and shape of your heart and how well your heart's chambers and valves are working. This procedure takes approximately one hour. There are no restrictions for this procedure.  Your physician has requested that you have an exercise tolerance test. For further information please visit HugeFiesta[facility][email or web address]. Please also follow instruction sheet, as given.  Schedule Cardiac Scoring.   Follow-Up: As needed, following results.      Signed, Fransico Him, MD  12/12/2018 10:18 AM    [address], [place], [place]  [address] Phone: [phone]; Fax: [phone]

## 2018-12-12 NOTE — Patient Instructions (Signed)
Medication Instructions:  Your physician recommends that you continue on your current medications as directed. Please refer to the Current Medication list given to you today.  If you need a refill on your cardiac medications before your next appointment, please call your pharmacy.   Lab work: None If you have labs (blood work) drawn today and your tests are completely normal, you will receive your results only by: Marland Kitchen MyChart Message (if you have MyChart) OR . A paper copy in the mail If you have any lab test that is abnormal or we need to change your treatment, we will call you to review the results.  Testing/Procedures: Your physician has requested that you have an echocardiogram. Echocardiography is a painless test that uses sound waves to create images of your heart. It provides your doctor with information about the size and shape of your heart and how well your heart's chambers and valves are working. This procedure takes approximately one hour. There are no restrictions for this procedure.  Your physician has requested that you have an exercise tolerance test. For further information please visit HugeFiesta[facility][email or web address]. Please also follow instruction sheet, as given.  Schedule Cardiac Scoring.   Follow-Up: As needed, following results.

## 2018-12-25 ENCOUNTER — Telehealth: Payer: Self-pay

## 2018-12-25 NOTE — Telephone Encounter (Signed)
-----   Message from Sueanne Margarita, MD sent at 12/24/2018  3:05 PM EDT ----- Regarding: RE: CT Please find out if patient has had any further CP Traci ----- Message ----- From: Sarina Ill, RN Sent: 12/24/2018  12:23 PM EDT To: Sueanne Margarita, MD Subject: CT                                             Can we reschedule?

## 2018-12-25 NOTE — Telephone Encounter (Signed)
Spoke with the patient, he is fine to reschedule his appointments. He stated he still has SOB, but it is stable. I advised the patient to contact our office, if he has any concerns. He had no further questions.

## 2018-12-31 ENCOUNTER — Other Ambulatory Visit (HOSPITAL_COMMUNITY): Payer: BLUE CROSS/BLUE SHIELD

## 2018-12-31 ENCOUNTER — Inpatient Hospital Stay: Admission: RE | Admit: 2018-12-31 | Payer: BLUE CROSS/BLUE SHIELD | Source: Ambulatory Visit

## 2019-02-15 ENCOUNTER — Telehealth (HOSPITAL_COMMUNITY): Payer: Self-pay | Admitting: Radiology

## 2019-02-15 NOTE — Telephone Encounter (Signed)
Left echocardiogram instructions on answering machine. Will need to call back to do a COVID pre-screen.

## 2019-02-19 ENCOUNTER — Other Ambulatory Visit (HOSPITAL_COMMUNITY): Payer: BLUE CROSS/BLUE SHIELD

## 2019-02-19 ENCOUNTER — Telehealth: Payer: Self-pay | Admitting: *Deleted

## 2019-02-19 ENCOUNTER — Other Ambulatory Visit: Payer: BLUE CROSS/BLUE SHIELD

## 2019-03-18 ENCOUNTER — Other Ambulatory Visit: Payer: Self-pay | Admitting: Hematology and Oncology

## 2019-03-18 DIAGNOSIS — C8238 Follicular lymphoma grade IIIa, lymph nodes of multiple sites: Secondary | ICD-10-CM

## 2019-03-22 ENCOUNTER — Inpatient Hospital Stay (HOSPITAL_BASED_OUTPATIENT_CLINIC_OR_DEPARTMENT_OTHER): Payer: BC Managed Care – PPO | Admitting: Hematology and Oncology

## 2019-03-22 ENCOUNTER — Other Ambulatory Visit: Payer: Self-pay

## 2019-03-22 ENCOUNTER — Encounter: Payer: Self-pay | Admitting: Hematology and Oncology

## 2019-03-22 ENCOUNTER — Inpatient Hospital Stay: Payer: BC Managed Care – PPO | Attending: Hematology and Oncology

## 2019-03-22 DIAGNOSIS — Z8572 Personal history of non-Hodgkin lymphomas: Secondary | ICD-10-CM | POA: Diagnosis not present

## 2019-03-22 DIAGNOSIS — C8238 Follicular lymphoma grade IIIa, lymph nodes of multiple sites: Secondary | ICD-10-CM

## 2019-03-22 LAB — COMPREHENSIVE METABOLIC PANEL
ALT: 20 U/L (ref 0–44)
AST: 18 U/L (ref 15–41)
Albumin: 4 g/dL (ref 3.5–5.0)
Alkaline Phosphatase: 57 U/L (ref 38–126)
Anion gap: 8 (ref 5–15)
BUN: 12 mg/dL (ref 8–23)
CO2: 26 mmol/L (ref 22–32)
Calcium: 8.8 mg/dL — ABNORMAL LOW (ref 8.9–10.3)
Chloride: 104 mmol/L (ref 98–111)
Creatinine, Ser: 0.99 mg/dL (ref 0.61–1.24)
GFR calc Af Amer: >60 mL/min (ref >60)
GFR calc non Af Amer: >60 mL/min (ref >60)
Glucose, Bld: 99 mg/dL (ref 70–99)
Potassium: 4.2 mmol/L (ref 3.5–5.1)
Sodium: 138 mmol/L (ref 135–145)
Total Bilirubin: 0.3 mg/dL (ref 0.3–1.2)
Total Protein: 6.7 g/dL (ref 6.5–8.1)

## 2019-03-22 LAB — CBC WITH DIFFERENTIAL/PLATELET
Abs Immature Granulocytes: 0.02 10*3/uL (ref 0.00–0.07)
Basophils Absolute: 0 10*3/uL (ref 0.0–0.1)
Basophils Relative: 1 %
Eosinophils Absolute: 0.1 10*3/uL (ref 0.0–0.5)
Eosinophils Relative: 2 %
HCT: 42 % (ref 39.0–52.0)
Hemoglobin: 13.4 g/dL (ref 13.0–17.0)
Immature Granulocytes: 0 %
Lymphocytes Relative: 31 %
Lymphs Abs: 1.5 10*3/uL (ref 0.7–4.0)
MCH: 28.2 pg (ref 26.0–34.0)
MCHC: 31.9 g/dL (ref 30.0–36.0)
MCV: 88.2 fL (ref 80.0–100.0)
Monocytes Absolute: 0.5 10*3/uL (ref 0.1–1.0)
Monocytes Relative: 11 %
Neutro Abs: 2.7 10*3/uL (ref 1.7–7.7)
Neutrophils Relative %: 55 %
Platelets: 223 10*3/uL (ref 150–400)
RBC: 4.76 MIL/uL (ref 4.22–5.81)
RDW: 13.5 % (ref 11.5–15.5)
WBC: 4.9 10*3/uL (ref 4.0–10.5)
nRBC: 0 % (ref 0.0–0.2)

## 2019-03-22 LAB — LACTATE DEHYDROGENASE: LDH: 181 U/L (ref 98–192)

## 2019-03-22 NOTE — Assessment & Plan Note (Signed)
Clinically, he has no signs or symptoms to suggest cancer recurrence His blood count is satisfactory The patient is educated to watch for signs and symptoms of cancer recurrence He is almost 5 years out from his chemotherapy We discussed long-term follow-up For now, he is comfortable to just follow with primary care doctor for yearly visit We discussed the importance of annual influenza vaccination

## 2019-03-22 NOTE — Progress Notes (Signed)
[facility] OFFICE PROGRESS NOTE  Patient Care Team: Christain Sacramento, MD as PCP - General (Family Medicine) Jackolyn Confer, MD as Referring Physician (General Surgery) Heath Lark, MD as Consulting Physician (Hematology and Oncology) Carol Ada, MD as Consulting Physician (Gastroenterology)  ASSESSMENT & PLAN:  Follicular lymphoma grade iiia, lymph nodes of multiple sites [facility]) Clinically, he has no signs or symptoms to suggest cancer recurrence His blood count is satisfactory The patient is educated to watch for signs and symptoms of cancer recurrence He is almost 5 years out from his chemotherapy We discussed long-term follow-up For now, he is comfortable to just follow with primary care doctor for yearly visit We discussed the importance of annual influenza vaccination   No orders of the defined types were placed in this encounter.   INTERVAL HISTORY: Please see below for problem oriented charting. He returns for further follow-up He denies new lymphadenopathy No abnormal anorexia, weight loss or night sweats No recent infection, fever or chills He is recently evaluated by cardiologist for shortness of breath Today, he denies chest pain or dizziness  SUMMARY OF ONCOLOGIC HISTORY: Oncology History Overview Note  Follicular lymphoma grade 3a   Primary site: Lymphoid Neoplasms   Staging method: AJCC 6th Edition   Clinical free text: Bone marrow is involved on PET; Possible component of DLBCL   Clinical: Stage IV signed by Heath Lark, MD on 06/18/2014  8:05 PM   Summary: Stage IV     Follicular lymphoma grade iiia, lymph nodes of multiple sites ([place])  05/13/2014 Imaging   CT scan of chest and neck showed extensive lymphadenopathy in the neck and medistinum   05/19/2014 Procedure   [id number] US biopsy only showed necrotic tissue   06/03/2014 Surgery   Excisional LN biopsy of right supraclavicular region showed follicular lymphoma grade 3 [id number]    06/14/2014 Imaging   PET CT scan showed diffuse, bulky lymphadenopathy, splenic involvement and bone marrow disease   06/16/2014 Imaging   ECHO showed preserved EF 50%   06/17/2014 Procedure   He has port placement   06/20/2014 - 09/12/2014 Chemotherapy   He received 5 cycles of chemotherapy with R. CHOP along with intrathecal methotrexate. He received 2 doses of IT MTX. He did not receive the final cycle of CHOP due to side effects   08/20/2014 Imaging   Repeat PET/CT scan showed significant improvement with near-complete response to treatment.   10/24/2014 Imaging   Repeat PET CT scan showed near complete resolution of lymphadenopathy   10/31/2014 - 09/02/2016 Chemotherapy   He received maintenance Rituxan every 60 days for 2 years   03/03/2015 Imaging   Repeat PET CT showed no evidence of active disease   09/29/2015 Procedure   He underwent endoscopy which showed Barrett's esophagus and angiodysplasia   03/03/2016 PET scan   No evidence of lymphoma. Diffuse increase metabolic activity in the stomach. Patient had an upper endoscopy in December 2016 without evidence of malignancy. Therefore, favor findings to represent gastritis   09/13/2016 PET scan   No findings suspicious for lymphoma. Specifically, no suspicious left inguinal lymphadenopathy.     03/08/2018 Procedure   EGD showed Barrett's esophagus   03/29/2018 Imaging   1. No findings of active malignancy in the chest, abdomen, or pelvis. 2. Postoperative findings from recent left groin hernia repair. 3. Other imaging findings of potential clinical significance: Aortic Atherosclerosis (ICD10-I70.0). Small type 1 hiatal hernia. Coronary atherosclerosis.     REVIEW OF SYSTEMS:   Constitutional:  Denies fevers, chills or abnormal weight loss Eyes: Denies blurriness of vision Ears, nose, mouth, throat, and face: Denies mucositis or sore throat Respiratory: Denies cough, dyspnea or wheezes Cardiovascular: Denies palpitation,  chest discomfort or lower extremity swelling Gastrointestinal:  Denies nausea, heartburn or change in bowel habits Skin: Denies abnormal skin rashes Lymphatics: Denies new lymphadenopathy or easy bruising Neurological:Denies numbness, tingling or new weaknesses Behavioral/Psych: Mood is stable, no new changes  All other systems were reviewed with the patient and are negative.  I have reviewed the past medical history, past surgical history, social history and family history with the patient and they are unchanged from previous note.  ALLERGIES:  has No Known Allergies.  MEDICATIONS:  Current Outpatient Medications  Medication Sig Dispense Refill  . cholecalciferol (VITAMIN D) 1000 UNITS tablet Take 2,000 Units by mouth daily.     Marland Kitchen ibuprofen (ADVIL,MOTRIN) 200 MG tablet Take 200 mg by mouth every 6 (six) hours as needed.    Marland Kitchen lisinopril (PRINIVIL,ZESTRIL) 20 MG tablet Take 20 mg by mouth daily.    . Melatonin 5 MG TABS Take 10 mg by mouth at bedtime as needed.    Marland Kitchen omeprazole (PRILOSEC) 20 MG capsule Take 20 mg by mouth 2 (two) times daily before a meal.     No current facility-administered medications for this visit.     PHYSICAL EXAMINATION: ECOG PERFORMANCE STATUS: 0 - Asymptomatic  Vitals:   03/22/19 0839  BP: 138/80  Pulse: 95  Resp: 18  Temp: 98.5 F (36.9 C)  SpO2: 100%   Filed Weights   03/22/19 0839  Weight: 176 lb 6.4 oz (80 kg)    GENERAL:alert, no distress and comfortable SKIN: skin color, texture, turgor are normal, no rashes or significant lesions EYES: normal, Conjunctiva are pink and non-injected, sclera clear OROPHARYNX:no exudate, no erythema and lips, buccal mucosa, and tongue normal  NECK: supple, thyroid normal size, non-tender, without nodularity LYMPH:  no palpable lymphadenopathy in the cervical, axillary or inguinal LUNGS: clear to auscultation and percussion with normal breathing effort HEART: regular rate & rhythm and no murmurs and no lower  extremity edema ABDOMEN:abdomen soft, non-tender and normal bowel sounds Musculoskeletal:no cyanosis of digits and no clubbing  NEURO: alert & oriented x 3 with fluent speech, no focal motor/sensory deficits  LABORATORY DATA:  I have reviewed the data as listed    Component Value Date/Time   NA 139 03/16/2018 0952   NA 137 09/15/2017 0935   K 4.1 03/16/2018 0952   K 4.3 09/15/2017 0935   CL 102 03/16/2018 0952   CO2 27 03/16/2018 0952   CO2 25 09/15/2017 0935   GLUCOSE 86 03/16/2018 0952   GLUCOSE 91 09/15/2017 0935   BUN 14 03/16/2018 0952   BUN 12.1 09/15/2017 0935   CREATININE 0.92 03/16/2018 0952   CREATININE 0.9 09/15/2017 0935   CALCIUM 9.3 03/16/2018 0952   CALCIUM 9.2 09/15/2017 0935   PROT 7.0 03/16/2018 0952   PROT 6.9 09/15/2017 0935   ALBUMIN 4.2 03/16/2018 0952   ALBUMIN 4.1 09/15/2017 0935   AST 19 03/16/2018 0952   AST 20 09/15/2017 0935   ALT 16 03/16/2018 0952   ALT 20 09/15/2017 0935   ALKPHOS 52 03/16/2018 0952   ALKPHOS 49 09/15/2017 0935   BILITOT 0.4 03/16/2018 0952   BILITOT 0.44 09/15/2017 0935   GFRNONAA >60 03/16/2018 0952   GFRAA >60 03/16/2018 0952    No results found for: SPEP, UPEP  Lab Results  Component Value Date  WBC 4.9 03/22/2019   NEUTROABS 2.7 03/22/2019   HGB 13.4 03/22/2019   HCT 42.0 03/22/2019   MCV 88.2 03/22/2019   PLT 223 03/22/2019      Chemistry      Component Value Date/Time   NA 139 03/16/2018 0952   NA 137 09/15/2017 0935   K 4.1 03/16/2018 0952   K 4.3 09/15/2017 0935   CL 102 03/16/2018 0952   CO2 27 03/16/2018 0952   CO2 25 09/15/2017 0935   BUN 14 03/16/2018 0952   BUN 12.1 09/15/2017 0935   CREATININE 0.92 03/16/2018 0952   CREATININE 0.9 09/15/2017 0935      Component Value Date/Time   CALCIUM 9.3 03/16/2018 0952   CALCIUM 9.2 09/15/2017 0935   ALKPHOS 52 03/16/2018 0952   ALKPHOS 49 09/15/2017 0935   AST 19 03/16/2018 0952   AST 20 09/15/2017 0935   ALT 16 03/16/2018 0952   ALT 20  09/15/2017 0935   BILITOT 0.4 03/16/2018 0952   BILITOT 0.44 09/15/2017 0935      All questions were answered. The patient knows to call the clinic with any problems, questions or concerns. No barriers to learning was detected.  I spent 10 minutes counseling the patient face to face. The total time spent in the appointment was 15 minutes and more than 50% was on counseling and review of test results  Heath Lark, MD 03/22/2019 9:04 AM

## 2019-05-22 ENCOUNTER — Ambulatory Visit (INDEPENDENT_AMBULATORY_CARE_PROVIDER_SITE_OTHER)
Admission: RE | Admit: 2019-05-22 | Discharge: 2019-05-22 | Disposition: A | Discharge status: Home / Self Care | Payer: Self-pay | Source: Ambulatory Visit | Attending: Cardiology | Admitting: Cardiology

## 2019-05-22 ENCOUNTER — Ambulatory Visit (HOSPITAL_COMMUNITY): Payer: BC Managed Care – PPO | Attending: Cardiovascular Disease

## 2019-05-22 ENCOUNTER — Other Ambulatory Visit: Payer: Self-pay

## 2019-05-22 DIAGNOSIS — R079 Chest pain, unspecified: Secondary | ICD-10-CM

## 2019-05-23 ENCOUNTER — Telehealth: Payer: Self-pay

## 2019-05-23 DIAGNOSIS — R079 Chest pain, unspecified: Secondary | ICD-10-CM

## 2019-05-23 DIAGNOSIS — R0600 Dyspnea, unspecified: Secondary | ICD-10-CM

## 2019-05-23 DIAGNOSIS — Z79899 Other long term (current) drug therapy: Secondary | ICD-10-CM

## 2019-05-23 DIAGNOSIS — R0609 Other forms of dyspnea: Secondary | ICD-10-CM

## 2019-05-23 DIAGNOSIS — R931 Abnormal findings on diagnostic imaging of heart and coronary circulation: Secondary | ICD-10-CM

## 2019-05-23 DIAGNOSIS — I10 Essential (primary) hypertension: Secondary | ICD-10-CM

## 2019-05-23 NOTE — Telephone Encounter (Signed)
Pt advised his calcium scoring and echo results... he will return 05/24/19 for fasting labs and made an appt with Dr. Radford Pax 07/09/19 to discuss everything with Dr. Radford Pax.

## 2019-05-23 NOTE — Telephone Encounter (Signed)
-----   Message from Sueanne Margarita, MD sent at 05/22/2019  8:06 PM EDT ----- Echo showed normal LVF trivial pericardial effusion,mild MVP with mild MR.  There is evidence of plaque in the aortic root corresponding to CT findings  In the aortic root.  He needs aggressive risk factor modification.

## 2019-05-24 ENCOUNTER — Encounter (INDEPENDENT_AMBULATORY_CARE_PROVIDER_SITE_OTHER): Payer: Self-pay

## 2019-05-24 ENCOUNTER — Other Ambulatory Visit: Payer: BC Managed Care – PPO | Admitting: *Deleted

## 2019-05-24 ENCOUNTER — Other Ambulatory Visit: Payer: Self-pay

## 2019-05-24 DIAGNOSIS — I10 Essential (primary) hypertension: Secondary | ICD-10-CM | POA: Diagnosis not present

## 2019-05-24 DIAGNOSIS — R079 Chest pain, unspecified: Secondary | ICD-10-CM | POA: Diagnosis not present

## 2019-05-24 DIAGNOSIS — Z79899 Other long term (current) drug therapy: Secondary | ICD-10-CM

## 2019-05-24 DIAGNOSIS — R0600 Dyspnea, unspecified: Secondary | ICD-10-CM

## 2019-05-24 DIAGNOSIS — R0609 Other forms of dyspnea: Secondary | ICD-10-CM | POA: Diagnosis not present

## 2019-05-24 DIAGNOSIS — R931 Abnormal findings on diagnostic imaging of heart and coronary circulation: Secondary | ICD-10-CM

## 2019-05-24 LAB — LIPID PANEL
Chol/HDL Ratio: 5.1 ratio — ABNORMAL HIGH (ref 0.0–5.0)
Cholesterol, Total: 243 mg/dL — ABNORMAL HIGH (ref 100–199)
HDL: 48 mg/dL (ref >39)
LDL Calculated: 177 mg/dL — ABNORMAL HIGH (ref 0–99)
Triglycerides: 92 mg/dL (ref 0–149)
VLDL Cholesterol Cal: 18 mg/dL (ref 5–40)

## 2019-05-24 LAB — AST: AST: 18 IU/L (ref 0–40)

## 2019-05-27 ENCOUNTER — Other Ambulatory Visit: Payer: Self-pay | Admitting: *Deleted

## 2019-05-27 DIAGNOSIS — E785 Hyperlipidemia, unspecified: Secondary | ICD-10-CM

## 2019-05-27 MED ORDER — ATORVASTATIN CALCIUM 20 MG PO TABS: 40.0000 mg | ORAL_TABLET | Freq: Every day | ORAL | 3 refills | Status: DC

## 2019-07-08 ENCOUNTER — Other Ambulatory Visit: Payer: Self-pay

## 2019-07-08 ENCOUNTER — Telehealth: Payer: Self-pay

## 2019-07-08 ENCOUNTER — Encounter (INDEPENDENT_AMBULATORY_CARE_PROVIDER_SITE_OTHER): Payer: Self-pay

## 2019-07-08 ENCOUNTER — Other Ambulatory Visit: Payer: BC Managed Care – PPO | Admitting: *Deleted

## 2019-07-08 DIAGNOSIS — E785 Hyperlipidemia, unspecified: Secondary | ICD-10-CM

## 2019-07-08 LAB — HEPATIC FUNCTION PANEL
ALT: 19 IU/L (ref 0–44)
AST: 22 IU/L (ref 0–40)
Albumin: 4.4 g/dL (ref 3.8–4.8)
Alkaline Phosphatase: 69 IU/L (ref 39–117)
Bilirubin Total: 0.4 mg/dL (ref 0.0–1.2)
Bilirubin, Direct: 0.1 mg/dL (ref 0.00–0.40)
Total Protein: 6.7 g/dL (ref 6.0–8.5)

## 2019-07-08 LAB — LIPID PANEL
Chol/HDL Ratio: 3.3 ratio (ref 0.0–5.0)
Cholesterol, Total: 163 mg/dL (ref 100–199)
HDL: 49 mg/dL (ref >39)
LDL Chol Calc (NIH): 100 mg/dL — ABNORMAL HIGH (ref 0–99)
Triglycerides: 75 mg/dL (ref 0–149)
VLDL Cholesterol Cal: 14 mg/dL (ref 5–40)

## 2019-07-08 NOTE — Telephone Encounter (Signed)
-----   Message from Sueanne Margarita, MD sent at 07/08/2019  3:39 PM EDT ----- LDL still too high - increase Lipitor to 80mg  daily and repeat FLP and ALT in 6 weeks

## 2019-07-08 NOTE — Telephone Encounter (Signed)
Notes recorded by Frederik Schmidt, RN on 07/08/2019 at 4:05 PM EDT  The patient has been notified of the result and verbalized understanding. All questions (if any) were answered.  Frederik Schmidt, RN 07/08/2019 4:05 PM   ------  The patient will further discuss his Lipitor medication during his Virtual visit with Dr Radford Pax on 10/6.   He needs to recheck his Lipitor bottle and may be only taking (1 tablet) 20 mg  daily, instead of the prescribed 40 mg daily (2 tablets).

## 2019-07-09 ENCOUNTER — Encounter: Payer: Self-pay | Admitting: Cardiology

## 2019-07-09 ENCOUNTER — Telehealth (INDEPENDENT_AMBULATORY_CARE_PROVIDER_SITE_OTHER): Payer: BC Managed Care – PPO | Admitting: Cardiology

## 2019-07-09 VITALS — Ht 70.0 in | Wt 174.0 lb

## 2019-07-09 DIAGNOSIS — R931 Abnormal findings on diagnostic imaging of heart and coronary circulation: Secondary | ICD-10-CM

## 2019-07-09 DIAGNOSIS — I10 Essential (primary) hypertension: Secondary | ICD-10-CM | POA: Diagnosis not present

## 2019-07-09 DIAGNOSIS — R0602 Shortness of breath: Secondary | ICD-10-CM

## 2019-07-09 DIAGNOSIS — R0600 Dyspnea, unspecified: Secondary | ICD-10-CM

## 2019-07-09 DIAGNOSIS — R0609 Other forms of dyspnea: Secondary | ICD-10-CM

## 2019-07-09 DIAGNOSIS — E785 Hyperlipidemia, unspecified: Secondary | ICD-10-CM

## 2019-07-09 MED ORDER — METOPROLOL TARTRATE 100 MG PO TABS: ORAL_TABLET | ORAL | 0 refills | Status: DC

## 2019-07-09 NOTE — Progress Notes (Signed)
Virtual Visit via Video Note   This visit type was conducted due to national recommendations for restrictions regarding the COVID-19 Pandemic (e.g. social distancing) in an effort to limit this patient's exposure and mitigate transmission in our community.  Due to his co-morbid illnesses, this patient is at least at moderate risk for complications without adequate follow up.  This format is felt to be most appropriate for this patient at this time.  All issues noted in this document were discussed and addressed.  A limited physical exam was performed with this format.  Please refer to the patient's chart for his consent to telehealth for [facility].   Evaluation Performed:  Follow-up visit  This visit type was conducted due to national recommendations for restrictions regarding the COVID-19 Pandemic (e.g. social distancing).  This format is felt to be most appropriate for this patient at this time.  All issues noted in this document were discussed and addressed.  No physical exam was performed (except for noted visual exam findings with Video Visits).  Please refer to the patient's chart (MyChart message for video visits and phone note for telephone visits) for the patient's consent to telehealth for [facility].  Date:  07/09/2019   ID:  Erik Crosby, DOB 1956/07/09, MRN [id number]  Patient Location:  Home  Provider location:   [place]  PCP:  Christain Sacramento, MD  Cardiologist:  Fransico Him, MD Electrophysiologist:  None   Chief Complaint:  Chest pain and SOB  History of Present Illness:    Erik Crosby is a 63 y.o. male who presents via audio/video conferencing for a telehealth visit today.    This is a 63yo male with a history of hypertension, GERD with Barrett's esophagus and dysplasia who is referred by his PCP for evaluation of shortness of breath and chest pain.  He has no history of tobacco use but does have a family history of CAD although at a later age  in life.  His father died of an MI at 61 years of age.  He saw me back in March for evaluation of CP which he felt was similar to the CP he had with his non-Hodgkin's lymphoma in the past but CT a year ago showed no reoccurrence of dz but did show coronary artery calcifications.  His CP was exertional and relieved with rest. He was supposed to get an ETT done but due to COVID this was cancelled.  He recently had a coronary calcium score done which was elevated at 96 and it was recommended that he start statin therapy but wanted to discuss this further.   He is here today for followup and is doing well.  He denies any chest pain or pressure, SOB, DOE, PND, orthopnea, LE edema, dizziness, palpitations or syncope. He is compliant with his meds and is tolerating meds with no SE.    The patient does not have symptoms concerning for COVID-19 infection (fever, chills, cough, or new shortness of breath).   Prior CV studies:   The following studies were reviewed today:  Coronary Ca score  Past Medical History:  Diagnosis Date  . AK (actinic keratosis)   . Barrett's esophagus without dysplasia   . Bone pain 06/23/2014  . Cancer ([place])   . Constipation 07/11/2014  . Follicular lymphoma grade 3a ([place]) 06/12/2014  . GERD (gastroesophageal reflux disease)    occ-takes vinagar  . Headache 07/11/2014  . Hypertension   . Insomnia 06/18/2014  . Nausea with vomiting  08/22/2014  . Other fatigue 03/04/2015  . Spinal headache 07/11/2014  . Wears glasses    Past Surgical History:  Procedure Laterality Date  . APPENDECTOMY    . COLONOSCOPY    . LYMPH NODE BIOPSY Right 06/03/2014   Procedure: RIGHT SUPRACLAVICULAR LYMPH NODE BIOPSY;  Surgeon: Odis Hollingshead, MD;  Location: [facility];  Service: General;  Laterality: Right;  . PORTACATH PLACEMENT N/A 06/17/2014   Procedure: ULTRASOUND GUIDED PORT-A-CATH INSERTION;  Surgeon: Jackolyn Confer, MD;  Location: [facility];  Service: General;  Laterality: N/A;      Current Meds  Medication Sig  . atorvastatin (LIPITOR) 20 MG tablet Take 2 tablets (40 mg total) by mouth daily.  . cholecalciferol (VITAMIN D) 1000 UNITS tablet Take 2,000 Units by mouth daily.   Marland Kitchen ibuprofen (ADVIL,MOTRIN) 200 MG tablet Take 200 mg by mouth every 6 (six) hours as needed.  Marland Kitchen lisinopril (PRINIVIL,ZESTRIL) 20 MG tablet Take 20 mg by mouth daily.  . Melatonin 5 MG TABS Take 10 mg by mouth at bedtime as needed.  Marland Kitchen omeprazole (PRILOSEC) 20 MG capsule Take 40 mg by mouth 2 (two) times daily before a meal.      Allergies:   Patient has no known allergies.   Social History   Tobacco Use  . Smoking status: Never Smoker  . Smokeless tobacco: Never Used  Substance Use Topics  . Alcohol use: No  . Drug use: No     Family Hx: The patient's family history includes Cancer in his mother; Colon cancer in an other family member; Diabetes in an other family member; Heart disease in his father; Hypertension in an other family member.  ROS:   Please see the history of present illness.     All other systems reviewed and are negative.   Labs/Other Tests and Data Reviewed:    Recent Labs: 03/22/2019: BUN 12; Creatinine, Ser 0.99; Hemoglobin 13.4; Platelets 223; Potassium 4.2; Sodium 138 07/08/2019: ALT 19   Recent Lipid Panel Lab Results  Component Value Date/Time   CHOL 163 07/08/2019 09:04 AM   TRIG 75 07/08/2019 09:04 AM   HDL 49 07/08/2019 09:04 AM   CHOLHDL 3.3 07/08/2019 09:04 AM   LDLCALC 100 (H) 07/08/2019 09:04 AM    Wt Readings from Last 3 Encounters:  07/09/19 174 lb (78.9 kg)  03/22/19 176 lb 6.4 oz (80 kg)  12/12/18 173 lb 12.8 oz (78.8 kg)     Objective:    Vital Signs:  Ht 5\' 10"  (1.778 m)   Wt 174 lb (78.9 kg)   BMI 24.97 kg/m    ASSESSMENT & PLAN:    1.  Coronary artery calcifications -coronary Ca score in August elevated at 62 -recommend starting statin therapy as LDL goal is < 70.  He increased atorva to 80mg  last night -his last LDL  was 177 -check HbA1C  2.  HTN -continue Lisinopril 20mg  daily  3.  SOB -he is still having problems with SOB but it does not limit his ability to do activities he wants to do -given the coronary calcifications on chest CT I have recommended proceeding with coronary CTA to rule out obstructive CAD -I suspect that SOB is related to deconditioning in the setting of COVID  4.  HLD -his LDL goal is < 70 given coronary calcifications -his LDL was 177 and he increased the atorvastatin to 80 mg daily -repeat FLP and ALT in 6 weeks  COVID-19 Education: The signs and symptoms of COVID-19  were discussed with the patient and how to seek care for testing (follow up with PCP or arrange E-visit).  The importance of social distancing was discussed today.  Patient Risk:   After full review of this patient's clinical status, I feel that they are at least moderate risk at this time.  Time:   Today, I have spent 20 minutes directly with the patient on telemedicine discussing medical problems including coronary Ca, HTN, CP.  We also reviewed the symptoms of COVID 19 and the ways to protect against contracting the virus with telehealth technology.  I spent an additional 5 minutes reviewing patient's chart including Chest CT for coronary Ca.  Medication Adjustments/Labs and Tests Ordered: Current medicines are reviewed at length with the patient today.  Concerns regarding medicines are outlined above.  Tests Ordered: No orders of the defined types were placed in this encounter.  Medication Changes: No orders of the defined types were placed in this encounter.   Disposition:  Follow up in 1 year(s)  Signed, Fransico Him, MD  07/09/2019 11:03 AM    [facility]

## 2019-07-09 NOTE — Addendum Note (Signed)
Addended by: Aris [place], [place] Pizzimenti L on: 07/09/2019 11:43 AM   Modules accepted: Orders

## 2019-07-09 NOTE — Patient Instructions (Addendum)
Medication Instructions:   If you need a refill on your cardiac medications before your next appointment, please call your pharmacy.   Lab work: Your physician recommends that you return for lab work in: 6 weeks for fasting lipid and ALT  If you have labs (blood work) drawn today and your tests are completely normal, you will receive your results only by: Marland Kitchen MyChart Message (if you have MyChart) OR . A paper copy in the mail If you have any lab test that is abnormal or we need to change your treatment, we will call you to review the results.  Testing/Procedures: Your physician has requested that you have cardiac CT. Cardiac computed tomography (CT) is a painless test that uses an x-ray machine to take clear, detailed pictures of your heart. For further information please visit [facility]ugeFiesta[facility]tn. Please follow instruction sheet as given.  Follow-Up: At [facility], you and your health needs are our priority.  As part of our continuing mission to provide you with exceptional heart care, we have created designated Provider Care Teams.  These Care Teams include your primary Cardiologist (physician) and Advanced Practice Providers (APPs -  Physician Assistants and Nurse Practitioners) who all work together to provide you with the care you need, when you need it. You will need a follow up appointment in 12 months.  Please call our office 2 months in advance to schedule this appointment.  You may see Dr. Radford Pax or one of the following Advanced Practice Providers on your designated Care Team:   [place], PA-C Melina Copa, PA-C . Ermalinda Barrios, PA-C   Your cardiac CT will be scheduled at one of the below locations:   [facility] [address] [place], [place] [address] [phone]  [facility] [address] [place], [place] [address] [phone]  If scheduled at [facility], please arrive at the [facility] main entrance of [facility] 30-45 minutes prior to test start time. Proceed to the [facility] Radiology Department (first floor) to check-in and test prep.  If scheduled at [facility], please arrive 15 mins early for check-in and test prep.  Please follow these instructions carefully (unless otherwise directed):  Hold all erectile dysfunction medications at least 3 days (72 hrs) prior to test.  On the Night Before the Test: . Be sure to Drink plenty of water. . Do not consume any caffeinated/decaffeinated beverages or chocolate 12 hours prior to your test. . Do not take any antihistamines 12 hours prior to your test.  On the Day of the Test: . Drink plenty of water. Do not drink any water within one hour of the test. . Do not eat any food 4 hours prior to the test. . You may take your regular medications prior to the test.  . Take metoprolol (Lopressor) 100 mg by mouth two hours prior to test.  After the Test: . Drink plenty of water. . After receiving IV contrast, you may experience a mild flushed feeling. This is normal. . On occasion, you may experience a mild rash up to 24 hours after the test. This is not dangerous. If this occurs, you can take Benadryl 25 mg and increase your fluid intake. . If you experience trouble breathing, this can be serious. If it is severe call [phone] IMMEDIATELY. If it is mild, please call our office.    Please contact the cardiac imaging nurse navigator should you have any  questions/concerns Marchia Bond, RN Navigator Cardiac Imaging [facility] [phone] Office  [phone] Cell

## 2019-08-06 ENCOUNTER — Telehealth: Payer: Self-pay | Admitting: Cardiology

## 2019-08-06 MED ORDER — ATORVASTATIN CALCIUM 80 MG PO TABS: 80.0000 mg | ORAL_TABLET | Freq: Every day | ORAL | 3 refills | Status: DC

## 2019-08-06 NOTE — Telephone Encounter (Signed)
I spoke to the patient and informed him that I would refill his Atorvastatin to 80 mg Daily.  He verbalized understanding.

## 2019-08-06 NOTE — Telephone Encounter (Signed)
It was increased at last OV.  OK to refill

## 2019-08-06 NOTE — Telephone Encounter (Signed)
[facility] is requesting a new script for Atorvastatin 80 mg tablet. Pharmacy stated that pt states that he take Atorvastatin 80 mg tablet. I do not see where Dr.Turner increased this medication. Would Dr. Radford Pax like to send in a new prescription for Atorvastatin? Please address

## 2019-08-20 ENCOUNTER — Other Ambulatory Visit: Payer: BC Managed Care – PPO

## 2019-08-20 ENCOUNTER — Other Ambulatory Visit: Payer: Self-pay

## 2019-08-20 DIAGNOSIS — R0600 Dyspnea, unspecified: Secondary | ICD-10-CM | POA: Diagnosis not present

## 2019-08-20 DIAGNOSIS — I10 Essential (primary) hypertension: Secondary | ICD-10-CM

## 2019-08-20 DIAGNOSIS — E785 Hyperlipidemia, unspecified: Secondary | ICD-10-CM

## 2019-08-20 DIAGNOSIS — R0602 Shortness of breath: Secondary | ICD-10-CM

## 2019-08-20 DIAGNOSIS — R931 Abnormal findings on diagnostic imaging of heart and coronary circulation: Secondary | ICD-10-CM | POA: Diagnosis not present

## 2019-08-20 DIAGNOSIS — R0609 Other forms of dyspnea: Secondary | ICD-10-CM

## 2019-08-20 LAB — LIPID PANEL
Chol/HDL Ratio: 3 ratio (ref 0.0–5.0)
Cholesterol, Total: 143 mg/dL (ref 100–199)
HDL: 48 mg/dL (ref >39)
LDL Chol Calc (NIH): 81 mg/dL (ref 0–99)
Triglycerides: 70 mg/dL (ref 0–149)
VLDL Cholesterol Cal: 14 mg/dL (ref 5–40)

## 2019-08-20 LAB — ALT: ALT: 18 IU/L (ref 0–44)

## 2019-08-21 ENCOUNTER — Telehealth: Payer: Self-pay

## 2019-08-21 DIAGNOSIS — E785 Hyperlipidemia, unspecified: Secondary | ICD-10-CM

## 2019-08-21 LAB — BASIC METABOLIC PANEL
BUN/Creatinine Ratio: 16 (ref 10–24)
BUN: 15 mg/dL (ref 8–27)
CO2: 25 mmol/L (ref 20–29)
Calcium: 9.8 mg/dL (ref 8.6–10.2)
Chloride: 102 mmol/L (ref 96–106)
Creatinine, Ser: 0.94 mg/dL (ref 0.76–1.27)
GFR calc Af Amer: 100 mL/min/{1.73_m2} (ref >59)
GFR calc non Af Amer: 87 mL/min/{1.73_m2} (ref >59)
Glucose: 103 mg/dL — ABNORMAL HIGH (ref 65–99)
Potassium: 5 mmol/L (ref 3.5–5.2)
Sodium: 141 mmol/L (ref 134–144)

## 2019-08-21 LAB — HEMOGLOBIN A1C
Est. average glucose Bld gHb Est-mCnc: 123 mg/dL
Hgb A1c MFr Bld: 5.9 % — ABNORMAL HIGH (ref 4.8–5.6)

## 2019-08-21 MED ORDER — EZETIMIBE 10 MG PO TABS: 10.0000 mg | ORAL_TABLET | Freq: Every day | ORAL | 3 refills | Status: DC

## 2019-08-21 NOTE — Telephone Encounter (Signed)
Notes recorded by Frederik Schmidt, RN on 08/21/2019 at 10:20 AM EST  The patient has been notified of the result and verbalized understanding. All questions (if any) were answered.  Frederik Schmidt, RN 08/21/2019 10:20 AM   Zetia added 10 mg and labs on 12/29.

## 2019-08-21 NOTE — Telephone Encounter (Signed)
-----   Message from Sueanne Margarita, MD sent at 08/20/2019  7:20 PM EST ----- LDL goal < 70.  LDL 83.  Add Zetia 10mg  daily.  Get FLP and ALT in 6 weeks

## 2019-08-22 ENCOUNTER — Telehealth (HOSPITAL_COMMUNITY): Payer: Self-pay | Admitting: Emergency Medicine

## 2019-08-22 NOTE — Telephone Encounter (Signed)
Reaching out to patient to offer assistance regarding upcoming cardiac imaging study; pt verbalizes understanding of appt date/time, parking situation and where to check in, pre-test NPO status and medications ordered, and verified current allergies; name and call back number provided for further questions should they arise Marchia Bond RN [place] [facility] [phone] office [phone] cell  Pt states he never received CTA instructions as his visit was virtual. Instructions reviewed with patient in its entirety. Pt verbalized understanding. Callback number provided.

## 2019-08-23 ENCOUNTER — Ambulatory Visit (HOSPITAL_COMMUNITY)
Admission: RE | Admit: 2019-08-23 | Discharge: 2019-08-23 | Disposition: A | Discharge status: Home / Self Care | Payer: BC Managed Care – PPO | Source: Ambulatory Visit | Attending: Cardiology | Admitting: Cardiology

## 2019-08-23 ENCOUNTER — Other Ambulatory Visit: Payer: Self-pay

## 2019-08-23 ENCOUNTER — Encounter: Payer: BC Managed Care – PPO | Admitting: *Deleted

## 2019-08-23 DIAGNOSIS — R0602 Shortness of breath: Secondary | ICD-10-CM | POA: Diagnosis not present

## 2019-08-23 DIAGNOSIS — R0609 Other forms of dyspnea: Secondary | ICD-10-CM

## 2019-08-23 DIAGNOSIS — I700 Atherosclerosis of aorta: Secondary | ICD-10-CM | POA: Diagnosis not present

## 2019-08-23 DIAGNOSIS — Z006 Encounter for examination for normal comparison and control in clinical research program: Secondary | ICD-10-CM

## 2019-08-23 DIAGNOSIS — R0600 Dyspnea, unspecified: Secondary | ICD-10-CM | POA: Diagnosis not present

## 2019-08-23 MED ORDER — NITROGLYCERIN 0.4 MG SL SUBL
0.8000 mg | SUBLINGUAL_TABLET | SUBLINGUAL | Status: DC | PRN
  Administered 2019-08-23: 0.8 mg via SUBLINGUAL

## 2019-08-23 MED ORDER — METOPROLOL TARTRATE 5 MG/5ML IV SOLN: 5.0000 mg | INTRAVENOUS | Status: DC | PRN

## 2019-08-23 MED ORDER — IOHEXOL 350 MG/ML SOLN
100.0000 mL | Freq: Once | INTRAVENOUS | Status: AC | PRN
  Administered 2019-08-23: 100 mL via INTRAVENOUS

## 2019-08-23 MED ORDER — NITROGLYCERIN 0.4 MG SL SUBL
SUBLINGUAL_TABLET | SUBLINGUAL | Status: AC
  Filled 2019-08-23: qty 2

## 2019-08-23 NOTE — Research (Signed)
CADFEM Informed Consent                  Subject Name:   Erik Crosby   Subject met inclusion and exclusion criteria.  The informed consent form, study requirements and expectations were reviewed with the subject and questions and concerns were addressed prior to the signing of the consent form.  The subject verbalized understanding of the trial requirements.  The subject agreed to participate in the CADFEM trial and signed the informed consent.  The informed consent was obtained prior to performance of any protocol-specific procedures for the subject.  A copy of the signed informed consent was given to the subject and a copy was placed in the subject's medical record.   [place] Chalmers, Research Assistant  08/23/2019 11;30 a.m.

## 2019-08-24 DIAGNOSIS — I700 Atherosclerosis of aorta: Secondary | ICD-10-CM | POA: Diagnosis not present

## 2019-08-26 ENCOUNTER — Telehealth: Payer: Self-pay

## 2019-08-26 ENCOUNTER — Other Ambulatory Visit: Payer: Self-pay

## 2019-08-26 MED ORDER — ASPIRIN EC 81 MG PO TBEC: 81.0000 mg | DELAYED_RELEASE_TABLET | Freq: Every day | ORAL | 3 refills | Status: DC

## 2019-08-26 NOTE — Telephone Encounter (Signed)
-----   Message from Sueanne Margarita, MD sent at 08/25/2019  5:56 PM EST ----- Please let patient know that FFR showed no flow limiting lesions. We could try a long acting nitrate to see if that would help with SOB but I suspect that his SOB is not cardiac.  Please find out what patient would like to do

## 2019-08-26 NOTE — Telephone Encounter (Signed)
Notes recorded by Frederik Schmidt, RN on 08/26/2019 at 9:14 AM EST  The patient has been notified of the result and verbalized understanding. All questions (if any) were answered.  Frederik Schmidt, RN 08/26/2019 9:14 AM   The patient started his Zetia on 08/23/19 and has another lab appointment on 10/01/19.  He will start the Aspirin 81 mg and would like to start exercising again to further evaluate his SOB and CP.  He will f/u with [place] and let us know if he sees improvement.

## 2019-09-24 ENCOUNTER — Telehealth: Payer: Self-pay | Admitting: Radiology

## 2019-09-24 NOTE — Telephone Encounter (Signed)
Cancel.

## 2019-09-24 NOTE — Telephone Encounter (Signed)
Exercise treadmill test was ordered back in March 2020 when treadmill room was shut down due to Covid. Looks like patient possibly has already had other testing done? Please advise if patient still needs test scheduled.  If no longer needed please cancel the order

## 2019-10-01 ENCOUNTER — Other Ambulatory Visit: Payer: BC Managed Care – PPO

## 2019-10-03 ENCOUNTER — Telehealth: Payer: Self-pay

## 2019-10-03 NOTE — Telephone Encounter (Signed)
Left message for patient to call back in regards to his symptoms and starting a long acting nitroglycerin.

## 2019-10-03 NOTE — Telephone Encounter (Signed)
-----   Message from Frederik Schmidt, RN sent at 10/02/2019 12:14 PM EST -----  ----- Message ----- From: Sueanne Margarita, MD Sent: 10/02/2019  11:49 AM EST To: [facility] Triage  Does he want to try low dose long acting nitroglycerin

## 2019-10-29 ENCOUNTER — Other Ambulatory Visit: Payer: Self-pay | Admitting: Cardiology

## 2019-10-29 MED ORDER — EZETIMIBE 10 MG PO TABS: 10.0000 mg | ORAL_TABLET | Freq: Every day | ORAL | 2 refills | Status: DC

## 2020-02-06 DIAGNOSIS — H35033 Hypertensive retinopathy, bilateral: Secondary | ICD-10-CM | POA: Diagnosis not present

## 2020-09-01 ENCOUNTER — Emergency Department
Admission: EM | Admit: 2020-09-01 | Discharge: 2020-09-01 | Disposition: A | Discharge status: Home / Self Care | Payer: BC Managed Care – PPO | Attending: Emergency Medicine | Admitting: Emergency Medicine

## 2020-09-01 ENCOUNTER — Emergency Department: Payer: BC Managed Care – PPO

## 2020-09-01 ENCOUNTER — Other Ambulatory Visit: Payer: Self-pay

## 2020-09-01 DIAGNOSIS — Z20822 Contact with and (suspected) exposure to covid-19: Secondary | ICD-10-CM | POA: Diagnosis not present

## 2020-09-01 DIAGNOSIS — R0902 Hypoxemia: Secondary | ICD-10-CM | POA: Diagnosis not present

## 2020-09-01 DIAGNOSIS — R072 Precordial pain: Secondary | ICD-10-CM | POA: Diagnosis not present

## 2020-09-01 DIAGNOSIS — I10 Essential (primary) hypertension: Secondary | ICD-10-CM | POA: Diagnosis not present

## 2020-09-01 DIAGNOSIS — R079 Chest pain, unspecified: Secondary | ICD-10-CM | POA: Diagnosis not present

## 2020-09-01 DIAGNOSIS — I471 Supraventricular tachycardia: Secondary | ICD-10-CM | POA: Insufficient documentation

## 2020-09-01 DIAGNOSIS — Z7982 Long term (current) use of aspirin: Secondary | ICD-10-CM | POA: Diagnosis not present

## 2020-09-01 DIAGNOSIS — Z79899 Other long term (current) drug therapy: Secondary | ICD-10-CM | POA: Diagnosis not present

## 2020-09-01 DIAGNOSIS — E876 Hypokalemia: Secondary | ICD-10-CM | POA: Insufficient documentation

## 2020-09-01 DIAGNOSIS — Z859 Personal history of malignant neoplasm, unspecified: Secondary | ICD-10-CM | POA: Diagnosis not present

## 2020-09-01 DIAGNOSIS — R000 Tachycardia, unspecified: Secondary | ICD-10-CM | POA: Diagnosis not present

## 2020-09-01 DIAGNOSIS — E785 Hyperlipidemia, unspecified: Secondary | ICD-10-CM

## 2020-09-01 DIAGNOSIS — R61 Generalized hyperhidrosis: Secondary | ICD-10-CM | POA: Diagnosis not present

## 2020-09-01 LAB — COMPREHENSIVE METABOLIC PANEL
ALT: 30 U/L (ref 0–44)
AST: 30 U/L (ref 15–41)
Albumin: 4.5 g/dL (ref 3.5–5.0)
Alkaline Phosphatase: 48 U/L (ref 38–126)
Anion gap: 15 (ref 5–15)
BUN: 15 mg/dL (ref 8–23)
CO2: 22 mmol/L (ref 22–32)
Calcium: 9.1 mg/dL (ref 8.9–10.3)
Chloride: 100 mmol/L (ref 98–111)
Creatinine, Ser: 0.95 mg/dL (ref 0.61–1.24)
GFR, Estimated: >60 mL/min (ref >60)
Glucose, Bld: 145 mg/dL — ABNORMAL HIGH (ref 70–99)
Potassium: 3.2 mmol/L — ABNORMAL LOW (ref 3.5–5.1)
Sodium: 137 mmol/L (ref 135–145)
Total Bilirubin: 0.6 mg/dL (ref 0.3–1.2)
Total Protein: 7.3 g/dL (ref 6.5–8.1)

## 2020-09-01 LAB — LDL CHOLESTEROL, DIRECT: Direct LDL: 181.3 mg/dL — ABNORMAL HIGH (ref 0–99)

## 2020-09-01 LAB — LIPID PANEL
Cholesterol: 285 mg/dL — ABNORMAL HIGH (ref 0–200)
HDL: 46 mg/dL (ref >40)
LDL Cholesterol: UNDETERMINED mg/dL (ref 0–99)
Total CHOL/HDL Ratio: 6.2 RATIO
Triglycerides: 574 mg/dL — ABNORMAL HIGH (ref <150)
VLDL: UNDETERMINED mg/dL (ref 0–40)

## 2020-09-01 LAB — CBC WITH DIFFERENTIAL/PLATELET
Abs Immature Granulocytes: 0.01 10*3/uL (ref 0.00–0.07)
Basophils Absolute: 0 10*3/uL (ref 0.0–0.1)
Basophils Relative: 1 %
Eosinophils Absolute: 0.1 10*3/uL (ref 0.0–0.5)
Eosinophils Relative: 2 %
HCT: 42.8 % (ref 39.0–52.0)
Hemoglobin: 13.3 g/dL (ref 13.0–17.0)
Immature Granulocytes: 0 %
Lymphocytes Relative: 36 %
Lymphs Abs: 2.1 10*3/uL (ref 0.7–4.0)
MCH: 24.7 pg — ABNORMAL LOW (ref 26.0–34.0)
MCHC: 31.1 g/dL (ref 30.0–36.0)
MCV: 79.6 fL — ABNORMAL LOW (ref 80.0–100.0)
Monocytes Absolute: 0.6 10*3/uL (ref 0.1–1.0)
Monocytes Relative: 10 %
Neutro Abs: 3 10*3/uL (ref 1.7–7.7)
Neutrophils Relative %: 51 %
Platelets: 276 10*3/uL (ref 150–400)
RBC: 5.38 MIL/uL (ref 4.22–5.81)
RDW: 15.9 % — ABNORMAL HIGH (ref 11.5–15.5)
WBC: 5.8 10*3/uL (ref 4.0–10.5)
nRBC: 0 % (ref 0.0–0.2)

## 2020-09-01 LAB — RESP PANEL BY RT-PCR (FLU A&B, COVID) ARPGX2
Influenza A by PCR: NEGATIVE
Influenza B by PCR: NEGATIVE
SARS Coronavirus 2 by RT PCR: NEGATIVE

## 2020-09-01 LAB — MAGNESIUM: Magnesium: 2.1 mg/dL (ref 1.7–2.4)

## 2020-09-01 LAB — TROPONIN I (HIGH SENSITIVITY)
Troponin I (High Sensitivity): 9 ng/L (ref <18)
Troponin I (High Sensitivity): 62 ng/L — ABNORMAL HIGH (ref <18)

## 2020-09-01 LAB — BRAIN NATRIURETIC PEPTIDE: B Natriuretic Peptide: 38.4 pg/mL (ref 0.0–100.0)

## 2020-09-01 LAB — TSH: TSH: 1.295 u[IU]/mL (ref 0.350–4.500)

## 2020-09-01 MED ORDER — OMEPRAZOLE 20 MG PO CPDR: 40.0000 mg | DELAYED_RELEASE_CAPSULE | Freq: Two times a day (BID) | ORAL | 0 refills | Status: AC

## 2020-09-01 MED ORDER — ATORVASTATIN CALCIUM 80 MG PO TABS: 80.0000 mg | ORAL_TABLET | Freq: Every day | ORAL | 3 refills | Status: DC

## 2020-09-01 MED ORDER — ATORVASTATIN CALCIUM 20 MG PO TABS
80.0000 mg | ORAL_TABLET | Freq: Every day | ORAL | Status: DC
  Administered 2020-09-01: 80 mg via ORAL
  Filled 2020-09-01: qty 4

## 2020-09-01 MED ORDER — LISINOPRIL 20 MG PO TABS: 20.0000 mg | ORAL_TABLET | Freq: Every day | ORAL | 0 refills | Status: DC

## 2020-09-01 MED ORDER — METOPROLOL TARTRATE 100 MG PO TABS: ORAL_TABLET | ORAL | 0 refills | Status: DC

## 2020-09-01 MED ORDER — POTASSIUM CHLORIDE CRYS ER 20 MEQ PO TBCR
40.0000 meq | EXTENDED_RELEASE_TABLET | Freq: Once | ORAL | Status: AC
  Administered 2020-09-01: 40 meq via ORAL
  Filled 2020-09-01: qty 2

## 2020-09-01 MED ORDER — LABETALOL HCL 5 MG/ML IV SOLN
10.0000 mg | Freq: Once | INTRAVENOUS | Status: AC
  Administered 2020-09-01: 10 mg via INTRAVENOUS
  Filled 2020-09-01: qty 4

## 2020-09-01 MED ORDER — LISINOPRIL 10 MG PO TABS
20.0000 mg | ORAL_TABLET | Freq: Every day | ORAL | Status: DC
  Administered 2020-09-01: 20 mg via ORAL
  Filled 2020-09-01: qty 2

## 2020-09-01 MED ORDER — EZETIMIBE 10 MG PO TABS: 10.0000 mg | ORAL_TABLET | Freq: Every day | ORAL | 2 refills | Status: DC

## 2020-09-01 MED ORDER — ASPIRIN EC 81 MG PO TBEC: 81.0000 mg | DELAYED_RELEASE_TABLET | Freq: Every day | ORAL | 3 refills | Status: DC

## 2020-09-01 MED ORDER — EZETIMIBE 10 MG PO TABS
10.0000 mg | ORAL_TABLET | Freq: Every day | ORAL | Status: DC
  Administered 2020-09-01: 10 mg via ORAL
  Filled 2020-09-01: qty 1

## 2020-09-01 MED ORDER — IBUPROFEN 200 MG PO TABS: 200.0000 mg | ORAL_TABLET | Freq: Four times a day (QID) | ORAL | 0 refills | Status: AC | PRN

## 2020-09-01 MED ORDER — METOPROLOL TARTRATE 50 MG PO TABS
100.0000 mg | ORAL_TABLET | Freq: Two times a day (BID) | ORAL | Status: DC
  Administered 2020-09-01: 100 mg via ORAL
  Filled 2020-09-01: qty 2

## 2020-09-01 MED ORDER — MELATONIN 5 MG PO TABS: 10.0000 mg | ORAL_TABLET | Freq: Every evening | ORAL | 0 refills | Status: AC | PRN

## 2020-09-01 NOTE — Discharge Instructions (Signed)
Please seek medical attention for any high fevers, chest pain, shortness of breath, change in behavior, persistent vomiting, bloody stool or any other new or concerning symptoms.  

## 2020-09-01 NOTE — ED Provider Notes (Signed)
[facility] Emergency Department Provider Note  ____________________________________________   First MD Initiated Contact with Patient 09/01/20 1338     (approximate)  I have reviewed the triage vital signs and the nursing notes.   HISTORY  Chief Complaint Irregular Heart Beat   HPI Erik Crosby is a 64 y.o. male with a past medical history of GERD, HTN, HDL not currently on retention or lipid-lowering medications, Barrett's esophagus, and chronic knee pain who presents via EMS from his workplace for assessment of episode of chest pain that was associate with some shortness of breath that started approximately 20 minutes prior to EMS arrival.  Patient states he has had 2 other episodes like this over the last 2 weeks that lasted less than 5 minutes while he was at rest experiencing pressure substernal chest like pain.  He states otherwise he has not had any significant pain and after he received adenosine by EMS he did not have any pain.  Per EMS patient was initially complaining of pain heart rate in the 190s with a sinus rhythm but this converted to a rate in the 110s after he received 6 mg of adenosine.  Patient was also given 224 mg of ASA prior arrival         Past Medical History:  Diagnosis Date  . AK (actinic keratosis)   . Barrett's esophagus without dysplasia   . Bone pain 06/23/2014  . Cancer ([place])   . Constipation 07/11/2014  . Follicular lymphoma grade 3a ([place]) 06/12/2014  . GERD (gastroesophageal reflux disease)    occ-takes vinagar  . Headache 07/11/2014  . Hypertension   . Insomnia 06/18/2014  . Nausea with vomiting 08/22/2014  . Other fatigue 03/04/2015  . Spinal headache 07/11/2014  . Wears glasses     Patient Active Problem List   Diagnosis Date Noted  . Chest pain 12/12/2018  . Weight loss 03/16/2018  . Inflamed external hemorrhoid 09/02/2016  . LAD (lymphadenopathy), inguinal 09/02/2016  . History of shingles 01/01/2016  .  Barrett esophagus 11/04/2015  . Microcytic anemia 09/04/2015  . Anemia in chronic illness 07/02/2015  . Other fatigue 03/04/2015  . Chronic leukopenia 03/04/2015  . Essential hypertension 03/04/2015  . Bilateral chronic knee pain 03/04/2015  . Preventive measure 01/03/2015  . Headache 07/11/2014  . Follicular lymphoma grade iiia, lymph nodes of multiple sites ([place]) 06/12/2014    Past Surgical History:  Procedure Laterality Date  . APPENDECTOMY    . COLONOSCOPY    . LYMPH NODE BIOPSY Right 06/03/2014   Procedure: RIGHT SUPRACLAVICULAR LYMPH NODE BIOPSY;  Surgeon: Odis Hollingshead, MD;  Location: [facility];  Service: General;  Laterality: Right;  . PORTACATH PLACEMENT N/A 06/17/2014   Procedure: ULTRASOUND GUIDED PORT-A-CATH INSERTION;  Surgeon: Jackolyn Confer, MD;  Location: [facility];  Service: General;  Laterality: N/A;    Prior to Admission medications   Medication Sig Start Date End Date Taking? Authorizing Provider  aspirin EC 81 MG tablet Take 1 tablet (81 mg total) by mouth daily. 08/26/19   Sueanne Margarita, MD  atorvastatin (LIPITOR) 80 MG tablet Take 1 tablet (80 mg total) by mouth daily. 08/06/19   Sueanne Margarita, MD  cholecalciferol (VITAMIN D) 1000 UNITS tablet Take 2,000 Units by mouth daily.     [provider]  ezetimibe (ZETIA) 10 MG tablet Take 1 tablet (10 mg total) by mouth daily. 10/29/19   Sueanne Margarita, MD  ibuprofen (ADVIL,MOTRIN) 200 MG tablet Take  200 mg by mouth every 6 (six) hours as needed.    [provider]  lisinopril (PRINIVIL,ZESTRIL) 20 MG tablet Take 20 mg by mouth daily. 11/13/18   [provider]  Melatonin 5 MG TABS Take 10 mg by mouth at bedtime as needed.    [provider]  metoprolol tartrate (LOPRESSOR) 100 MG tablet Take one tablet by mouth 2 hours prior to CT test 07/09/19   Sueanne Margarita, MD  omeprazole (PRILOSEC) 20 MG capsule Take 40 mg by mouth 2 (two) times daily before a meal.      [provider]    Allergies Patient has no known allergies.  Family History  Problem Relation Age of Onset  . Cancer Mother        colon ca & pancreatic ca  . Heart disease Father   . Hypertension Other   . Colon cancer Other   . Diabetes Other     Social History Social History   Tobacco Use  . Smoking status: Never Smoker  . Smokeless tobacco: Never Used  Vaping Use  . Vaping Use: Never used  Substance Use Topics  . Alcohol use: Yes    Alcohol/week: 21.0 standard drinks    Types: 21 Shots of liquor per week  . Drug use: Yes    Types: Marijuana    Comment: Pt states that he eats marijuana cookies     Review of Systems  Review of Systems  Constitutional: Negative for chills and fever.  HENT: Negative for sore throat.   Eyes: Negative for pain.  Respiratory: Negative for cough and stridor.   Cardiovascular: Positive for chest pain and palpitations.  Gastrointestinal: Negative for vomiting.  Genitourinary: Negative for dysuria.  Musculoskeletal: Negative for myalgias.  Skin: Negative for rash.  Neurological: Negative for seizures, loss of consciousness and headaches.  Psychiatric/Behavioral: Negative for suicidal ideas.  All other systems reviewed and are negative.     ____________________________________________   PHYSICAL EXAM:  VITAL SIGNS: ED Triage Vitals  Enc Vitals Group     BP      Pulse      Resp      Temp      Temp src      SpO2      Weight      Height      Head Circumference      Peak Flow      Pain Score      Pain Loc      Pain Edu?      Excl. in [place]?    Vitals:   09/01/20 1530 09/01/20 1545  BP: (!) 153/89   Pulse: 96 90  Resp: 15 (!) 21  Temp:    SpO2: 98% 97%   Physical Exam Vitals and nursing note reviewed.  Constitutional:      Appearance: He is well-developed.  HENT:     Head: Normocephalic and atraumatic.     Right Ear: External ear normal.     Left Ear: External ear normal.     Nose: Nose normal.    Eyes:     Conjunctiva/sclera: Conjunctivae normal.  Cardiovascular:     Rate and Rhythm: Regular rhythm. Tachycardia present.     Heart sounds: No murmur heard.   Pulmonary:     Effort: Pulmonary effort is normal. No respiratory distress.     Breath sounds: Normal breath sounds.  Abdominal:     Palpations: Abdomen is soft.     Tenderness: There  is no abdominal tenderness.  Musculoskeletal:     Cervical back: Neck supple.  Skin:    General: Skin is warm and dry.     Capillary Refill: Capillary refill takes less than 2 seconds.  Neurological:     Mental Status: He is alert and oriented to person, place, and time.  Psychiatric:        Mood and Affect: Mood normal.      ____________________________________________   LABS (all labs ordered are listed, but only abnormal results are displayed)  Labs Reviewed  CBC WITH DIFFERENTIAL/PLATELET - Abnormal; Notable for the following components:      Result Value   MCV 79.6 (*)    MCH 24.7 (*)    RDW 15.9 (*)    All other components within normal limits  COMPREHENSIVE METABOLIC PANEL - Abnormal; Notable for the following components:   Potassium 3.2 (*)    Glucose, Bld 145 (*)    All other components within normal limits  LIPID PANEL - Abnormal; Notable for the following components:   Cholesterol 285 (*)    Triglycerides 574 (*)    All other components within normal limits  RESP PANEL BY RT-PCR (FLU A&B, COVID) ARPGX2  MAGNESIUM  BRAIN NATRIURETIC PEPTIDE  TSH  HEMOGLOBIN A1C  LDL CHOLESTEROL, DIRECT  TROPONIN I (HIGH SENSITIVITY)  TROPONIN I (HIGH SENSITIVITY)   ____________________________________________  EKG  Sinus tachycardia with ventricular to 102, normal axis, unremarkable intervals, somewhat wandering tracing in leads I, III, aVR, and aVL nonspecific ST changes in V2 and without other clear evidence of acute ischemia. ____________________________________________  RADIOLOGY  ED MD interpretation: No focal  consolidation, large effusion, overt edema, or other clear acute thoracic process.  Official radiology report(s): DG Chest 2 View  Result Date: 09/01/2020 CLINICAL DATA:  Chest pain. EXAM: CHEST - 2 VIEW COMPARISON:  None. FINDINGS: The heart size and mediastinal contours are within normal limits. Both lungs are clear. No pneumothorax or pleural effusion is noted. Left subclavian Port-A-Cath is in good position. The visualized skeletal structures are unremarkable. IMPRESSION: No active cardiopulmonary disease. Electronically Signed   By: Marijo Conception M.D.   On: 09/01/2020 14:33    ____________________________________________   PROCEDURES  Procedure(s) performed (including Critical Care):  .1-3 Lead EKG Interpretation Performed by: Lucrezia Starch, MD Authorized by: Lucrezia Starch, MD     Interpretation: normal     ECG rate assessment: tachycardic     Rhythm: sinus rhythm     Ectopy: none     Conduction: normal       ____________________________________________   INITIAL IMPRESSION / ASSESSMENT AND PLAN / ED COURSE        Patient presents with above history and exam for assessment of some chest pain and SVT converted by EMS in the field with 6 mg of adenosine.  As described above patient has had couple episodes like this in the past although is not taking his metoprolol or other blood pressure statin medications for several months.  On arrival patient denies any chest pain stated it stopped after he received adenosine.  He is slightly tachycardic on arrival with heart rate of 103 and hypertensive with a BP of 189/100 with otherwise stable vital signs on room air.   Differential includes but is not limited to symptomatic SVT versus angina.  No other arrhythmia seen on ECG.  Low suspicion for PE as patient denies any chest pain shortness of breath and is not hypoxic or tachypneic on arrival.  Initial EKG does have some nonspecific changes initial troponin is nonelevated.  I  discussed patient's presentation and work-up with on-call cardiologist Dr. Garen Lah recommended given patient's home blood pressure medications and if repeat troponin was nonelevated would likely be safe from cardiac perspective for outpatient follow-up.  Chest x-ray obtained shows no evidence of pneumothorax, pneumonia, or acute volume overload.  CBC is unremarkable for evidence of acute anemia or other abnormalities.  CMP shows mild hypokalemia with a K of 3.2 with no other significant electrolyte or metabolic derangements.  Her glucose is slightly elevated at 145.  Magnesium is 2.1.  BNP is 38 and overall a low suspicion for acute heart failure at this time.  TSH is WNL and I have low suspicion for thyrotoxicosis.  Patient denies any substance use and I will suspicion for toxic in her sympathomimetic ingestion.  Lipid panel does show significant elevated triglycerides and cholesterol I suspect is related noncompliance with statin therapy.  Care patient signed over to oncoming provider at approximately 1500.  Plan is to follow-up repeat troponin and if patient remains asymptomatic and has improving blood pressure and heart rate will likely be safe for discharge with plan for close outpatient cardiology follow-up.    ____________________________________________   FINAL CLINICAL IMPRESSION(S) / ED DIAGNOSES  Final diagnoses:  SVT (supraventricular tachycardia) (HCC)  Hypertension, unspecified type  Hyperlipidemia, unspecified hyperlipidemia type  Hypokalemia    Medications  metoprolol tartrate (LOPRESSOR) tablet 100 mg (100 mg Oral Given 09/01/20 1519)  lisinopril (ZESTRIL) tablet 20 mg (20 mg Oral Given 09/01/20 1518)  atorvastatin (LIPITOR) tablet 80 mg (80 mg Oral Given 09/01/20 1517)  ezetimibe (ZETIA) tablet 10 mg (10 mg Oral Given 09/01/20 1519)  labetalol (NORMODYNE) injection 10 mg (10 mg Intravenous Given 09/01/20 1413)  potassium chloride SA (KLOR-CON) CR tablet 40 mEq (40 mEq  Oral Given 09/01/20 1518)     ED Discharge Orders    None       Note:  This document was prepared using Dragon voice recognition software and may include unintentional dictation errors.   Lucrezia Starch, MD 09/01/20 [phone]

## 2020-09-01 NOTE — ED Triage Notes (Signed)
Pt BIB EMS from work - found pt in SVT 190s. Pt was c/o CP and SOB at the time. EMS gave 6 of adenosine and 324 of ASA. Pt converted and states that he feels much better. Pt has had two other episodes of SVT over the last few weeks - states he has a cardiologist but has not seen them recently

## 2020-09-01 NOTE — Consult Note (Signed)
Cardiology Consultation:   Patient ID: ENOC GETTER MRN: [id number]; DOB: 1956-02-20  Admit date: 09/01/2020 Date of Consult: 09/01/2020  Primary Care Provider: Heywood Bene, PA-C [facility] Cardiologist: Fransico Him, MD  [facility] Electrophysiologist:  None    Patient Profile:   Erik Crosby is a 64 y.o. male with a hx of hypertension, hyperlipidemia, nonobstructive CAD who is being seen today for the evaluation of chest pain at the request of Dr. Tamala Julian.  History of Present Illness:   Erik Crosby is a 64 year old gentleman with history of hypertension, hyperlipidemia, nonobstructive CAD (coronary CTA 08/2019 mild LAD disease), Barrett's esophagus who presents due to chest pain over the past couple of days.  Chest pain was initially associated with exertion while moving his trash yesterday but subsequently resolved.  On his way to work today he noticed another episode while driving.  He describes chest pain as pressure, 7 out of 10 in severity.  Denies palpitations.  Due to persistent symptoms, he called EMS.  Upon EMS arrival, he was noted to be in SVT heart rates up to 190s.  He was given adenosine IV 6 mg x 1 with improvement in heart rates to low 100s.  His symptoms of chest discomfort subsequently improved after adenosine.  Patient states not following up with primary cardiologist for over a year now.  He is supposed to be on medications for hypertension, cholesterol which she has not taken for about a year now.  Currently denies chest pain upon my exam.  In the ED, EKG showed sinus tachycardia, heart rate 102.  Initial troponin was normal.  Blood pressure was elevated with systolic in the 509T.   Past Medical History:  Diagnosis Date  . AK (actinic keratosis)   . Barrett's esophagus without dysplasia   . Bone pain 06/23/2014  . Cancer ([place])   . Constipation 07/11/2014  . Follicular lymphoma grade 3a ([place]) 06/12/2014  . GERD (gastroesophageal  reflux disease)    occ-takes vinagar  . Headache 07/11/2014  . Hypertension   . Insomnia 06/18/2014  . Nausea with vomiting 08/22/2014  . Other fatigue 03/04/2015  . Spinal headache 07/11/2014  . Wears glasses     Past Surgical History:  Procedure Laterality Date  . APPENDECTOMY    . COLONOSCOPY    . LYMPH NODE BIOPSY Right 06/03/2014   Procedure: RIGHT SUPRACLAVICULAR LYMPH NODE BIOPSY;  Surgeon: Odis Hollingshead, MD;  Location: [place];  Service: General;  Laterality: Right;  . PORTACATH PLACEMENT N/A 06/17/2014   Procedure: ULTRASOUND GUIDED PORT-A-CATH INSERTION;  Surgeon: Jackolyn Confer, MD;  Location: [place];  Service: General;  Laterality: N/A;     Home Medications:  Prior to Admission medications   Medication Sig Start Date End Date Taking? Authorizing Provider  aspirin EC 81 MG tablet Take 1 tablet (81 mg total) by mouth daily. 08/26/19   Sueanne Margarita, MD  atorvastatin (LIPITOR) 80 MG tablet Take 1 tablet (80 mg total) by mouth daily. 08/06/19   Sueanne Margarita, MD  cholecalciferol (VITAMIN D) 1000 UNITS tablet Take 2,000 Units by mouth daily.     [provider]  ezetimibe (ZETIA) 10 MG tablet Take 1 tablet (10 mg total) by mouth daily. 10/29/19   Sueanne Margarita, MD  ibuprofen (ADVIL,MOTRIN) 200 MG tablet Take 200 mg by mouth every 6 (six) hours as needed.    [provider]  lisinopril (PRINIVIL,ZESTRIL) 20 MG tablet Take 20 mg by mouth daily. 11/13/18  [provider]  Melatonin 5 MG TABS Take 10 mg by mouth at bedtime as needed.    [provider]  metoprolol tartrate (LOPRESSOR) 100 MG tablet Take one tablet by mouth 2 hours prior to CT test 07/09/19   Sueanne Margarita, MD  omeprazole (PRILOSEC) 20 MG capsule Take 40 mg by mouth 2 (two) times daily before a meal.     [provider]    Inpatient Medications: Scheduled Meds: . atorvastatin  80 mg Oral Daily  . ezetimibe  10 mg Oral Daily  . lisinopril  20 mg  Oral Daily  . metoprolol tartrate  100 mg Oral BID   Continuous Infusions:  PRN Meds:   Allergies:   No Known Allergies  Social History:   Social History   Socioeconomic History  . Marital status: Married    Spouse name: Not on file  . Number of children: Not on file  . Years of education: Not on file  . Highest education level: Not on file  Occupational History  . Not on file  Tobacco Use  . Smoking status: Never Smoker  . Smokeless tobacco: Never Used  Vaping Use  . Vaping Use: Never used  Substance and Sexual Activity  . Alcohol use: Yes    Alcohol/week: 21.0 standard drinks    Types: 21 Shots of liquor per week  . Drug use: Yes    Types: Marijuana    Comment: Pt states that he eats marijuana cookies   . Sexual activity: Not on file  Other Topics Concern  . Not on file  Social History Narrative  . Not on file   Social Determinants of Health   Financial Resource Strain:   . Difficulty of Paying Living Expenses: Not on file  Food Insecurity:   . Worried About Charity fundraiser in the Last Year: Not on file  . Ran Out of Food in the Last Year: Not on file  Transportation Needs:   . Lack of Transportation (Medical): Not on file  . Lack of Transportation (Non-Medical): Not on file  Physical Activity:   . Days of Exercise per Week: Not on file  . Minutes of Exercise per Session: Not on file  Stress:   . Feeling of Stress : Not on file  Social Connections:   . Frequency of Communication with Friends and Family: Not on file  . Frequency of Social Gatherings with Friends and Family: Not on file  . Attends Religious Services: Not on file  . Active Member of Clubs or Organizations: Not on file  . Attends Archivist Meetings: Not on file  . Marital Status: Not on file  Intimate Partner Violence:   . Fear of Current or Ex-Partner: Not on file  . Emotionally Abused: Not on file  . Physically Abused: Not on file  . Sexually Abused: Not on file      Family History:    Family History  Problem Relation Age of Onset  . Cancer Mother        colon ca & pancreatic ca  . Heart disease Father   . Hypertension Other   . Colon cancer Other   . Diabetes Other      ROS:  Please see the history of present illness.   All other ROS reviewed and negative.     Physical Exam/Data:   Vitals:   09/01/20 1500 09/01/20 1515 09/01/20 1530 09/01/20 1545  BP: (!) 173/93 (!) 147/88 (!) 153/89  Pulse: (!) 102 (!) 102 96 90  Resp: 15 17 15  (!) 21  Temp:      TempSrc:      SpO2: 97% 96% 98% 97%  Weight:      Height:       No intake or output data in the 24 hours ending 09/01/20 1604 Last 3 Weights 09/01/2020 07/09/2019 03/22/2019  Weight (lbs) 180 lb 174 lb 176 lb 6.4 oz  Weight (kg) 81.647 kg 78.926 kg 80.015 kg     Body mass index is 25.83 kg/m.  General:  Well nourished, well developed, in no acute distress HEENT: normal Lymph: no adenopathy Neck: no JVD Endocrine:  No thryomegaly Vascular: No carotid bruits; FA pulses 2+ bilaterally without bruits  Cardiac:  normal S1, S2; RRR; no murmur  Lungs:  clear to auscultation bilaterally, no wheezing, rhonchi or rales  Abd: soft, nontender, no hepatomegaly  Ext: no edema Musculoskeletal:  No deformities, BUE and BLE strength normal and equal Skin: warm and dry  Neuro:  CNs 2-12 intact, no focal abnormalities noted Psych:  Normal affect   EKG:  The EKG was personally reviewed and demonstrates: Sinus tachycardia Rhythm strip from EMS showed SVT Telemetry:  Telemetry was personally reviewed and demonstrates: Sinus rhythm  Relevant CV Studies: Echo 05/2019 1. The left ventricle has normal systolic function with an ejection  fraction of 60-65%. The cavity size was normal. Left ventricular diastolic  parameters were normal. No evidence of left ventricular regional wall  motion abnormalities.  2. The right ventricle has normal systolic function. The cavity was  normal. There is no  increase in right ventricular wall thickness. Right  ventricular systolic pressure is normal with an estimated pressure of 26.0  mmHg.  3. Trivial pericardial effusion is present.  4. Mild thickening of the mitral valve leaflet. The MR jet is  anteriorly-directed.  5. There is mild PMVL prolapse. There is mild, anteriorly-directed  regurgitation which occurs in late systole.  6. The aorta is normal unless otherwise noted.  7. The aortic root and ascending aorta are normal in size and structure.  8. There is evidence of plaque in the aortic root.   Coronary CTA 08/2019 IMPRESSION: 1. Coronary calcium score of 120. This was 66th percentile for age and sex matched control.  2. Normal coronary origin with right dominance.  3. There is mild atherosclerosis of the proximal and mid LAD. CAD-RADS 2.  4. Consider non cardiac causes of chest pain.  Laboratory Data:  High Sensitivity Troponin:   Recent Labs  Lab 09/01/20 1345  TROPONINIHS 9     Chemistry Recent Labs  Lab 09/01/20 1345  NA 137  K 3.2*  CL 100  CO2 22  GLUCOSE 145*  BUN 15  CREATININE 0.95  CALCIUM 9.1  GFRNONAA >60  ANIONGAP 15    Recent Labs  Lab 09/01/20 1345  PROT 7.3  ALBUMIN 4.5  AST 30  ALT 30  ALKPHOS 48  BILITOT 0.6   Hematology Recent Labs  Lab 09/01/20 1345  WBC 5.8  RBC 5.38  HGB 13.3  HCT 42.8  MCV 79.6*  MCH 24.7*  MCHC 31.1  RDW 15.9*  PLT 276   BNP Recent Labs  Lab 09/01/20 1345  BNP 38.4    DDimer No results for input(s): DDIMER in the last 168 hours.   Radiology/Studies:  DG Chest 2 View  Result Date: 09/01/2020 CLINICAL DATA:  Chest pain. EXAM: CHEST - 2 VIEW COMPARISON:  None. FINDINGS: The  heart size and mediastinal contours are within normal limits. Both lungs are clear. No pneumothorax or pleural effusion is noted. Left subclavian Port-A-Cath is in good position. The visualized skeletal structures are unremarkable. IMPRESSION: No active  cardiopulmonary disease. Electronically Signed   By: Marijo Conception M.D.   On: 09/01/2020 14:33     Assessment and Plan:   1.  Chest pain -Currently resolved with normal heart rates and improved blood pressures -EKG nonischemic, initial troponin is normal. -SVT and hypotension contributing -Restart PTA lisinopril, losartan, aspirin, Lipitor -Last echo with normal EF, coronary CTA a year ago with mild LAD disease. -Patient can be discharged from a cardiac perspective from the ED with controlled heart rate, control blood pressures, asymptomatic, normal troponin. -Does follow-up with outpatient cardiology as recommended.  2.  Paroxysmal SVT -s/p adenosine IV 6 mg x 1.  Maintaining sinus rhythm -Lopressor 100 mg twice daily -Medication adherence advised -Follow-up outpatient with primary cardiologist  3.  Hypertension -BP improved -Restart PTA Lopressor, lisinopril  4.  Hyperlipidemia -Restart PTA Lipitor, Zetia  5. CAD, non obstructive -asa, lipitor, BB  Total encounter time 110 minutes  Greater than 50% was spent in counseling and coordination of care with the patient    Signed, Kate Sable, MD  09/01/2020 4:04 PM

## 2020-09-01 NOTE — ED Provider Notes (Signed)
Repeat troponin mildly elevated at 62. Discussed with Dr. Garen Lah. At this time had low concern for slight elevation in setting of SVT. Will plan on discharging to follow up with cardiology.   Nance Pear, MD 09/01/20 [phone]

## 2020-09-02 DIAGNOSIS — I471 Supraventricular tachycardia, unspecified: Secondary | ICD-10-CM

## 2020-09-02 HISTORY — DX: Supraventricular tachycardia, unspecified: I47.10

## 2020-09-02 HISTORY — DX: Supraventricular tachycardia: I47.1

## 2020-10-01 ENCOUNTER — Telehealth: Payer: Self-pay | Admitting: Cardiology

## 2020-10-01 ENCOUNTER — Other Ambulatory Visit: Payer: Self-pay

## 2020-10-01 MED ORDER — LISINOPRIL 20 MG PO TABS: 20.0000 mg | ORAL_TABLET | Freq: Every day | ORAL | 0 refills | Status: DC

## 2020-10-01 NOTE — Telephone Encounter (Signed)
*  STAT* If patient is at the pharmacy, call can be transferred to refill team.   1. Which medications need to be refilled? (please list name of each medication and dose if known)  lisinopril (ZESTRIL) 20 MG tablet  2. Which pharmacy/location (including street and city if local pharmacy) is medication to be sent to? [id number] - [place], [place] - [address]  3. Do they need a 30 day or 90 day supply? 30   Patient will not have enough medication to last him until his appt 10/09/20

## 2020-10-06 ENCOUNTER — Other Ambulatory Visit: Payer: Self-pay

## 2020-10-06 ENCOUNTER — Encounter: Payer: Self-pay | Admitting: Cardiology

## 2020-10-06 ENCOUNTER — Ambulatory Visit: Payer: BC Managed Care – PPO | Admitting: Cardiology

## 2020-10-06 VITALS — BP 130/76 | HR 89 | Ht 70.0 in | Wt 186.0 lb

## 2020-10-06 DIAGNOSIS — I2510 Atherosclerotic heart disease of native coronary artery without angina pectoris: Secondary | ICD-10-CM | POA: Diagnosis not present

## 2020-10-06 DIAGNOSIS — I10 Essential (primary) hypertension: Secondary | ICD-10-CM

## 2020-10-06 DIAGNOSIS — I2583 Coronary atherosclerosis due to lipid rich plaque: Secondary | ICD-10-CM

## 2020-10-06 DIAGNOSIS — R0600 Dyspnea, unspecified: Secondary | ICD-10-CM | POA: Diagnosis not present

## 2020-10-06 DIAGNOSIS — R0609 Other forms of dyspnea: Secondary | ICD-10-CM

## 2020-10-06 DIAGNOSIS — R072 Precordial pain: Secondary | ICD-10-CM

## 2020-10-06 DIAGNOSIS — E7800 Pure hypercholesterolemia, unspecified: Secondary | ICD-10-CM | POA: Diagnosis not present

## 2020-10-06 LAB — BASIC METABOLIC PANEL
BUN/Creatinine Ratio: 14 (ref 10–24)
BUN: 14 mg/dL (ref 8–27)
CO2: 24 mmol/L (ref 20–29)
Calcium: 9.2 mg/dL (ref 8.6–10.2)
Chloride: 99 mmol/L (ref 96–106)
Creatinine, Ser: 1.01 mg/dL (ref 0.76–1.27)
GFR calc Af Amer: 91 mL/min/{1.73_m2} (ref >59)
GFR calc non Af Amer: 79 mL/min/{1.73_m2} (ref >59)
Glucose: 117 mg/dL — ABNORMAL HIGH (ref 65–99)
Potassium: 3.8 mmol/L (ref 3.5–5.2)
Sodium: 136 mmol/L (ref 134–144)

## 2020-10-06 NOTE — Progress Notes (Signed)
Date:  10/06/2020   ID:  [name], DOB 03-21-1956, MRN [id number]   PCP:  Heywood Bene, PA-C  Cardiologist:  Fransico Him, MD Electrophysiologist:  None   Chief Complaint:  CAD, HTN, HLD  History of Present Illness:    Erik Crosby is a 65 y.o. male with a history of hypertension, GERD with Barrett's esophagus and dysplasia and fm hx of premature CAD.  He was referred to me for evaluation of DOE and chest pain.   He has no history of tobacco use but does have a family history of CAD although at a later age in life.  His father died of an MI at 80 years of age.  He saw me back in March for evaluation of CP which he felt was similar to the CP he had with his non-Hodgkin's lymphoma in the past but CT a year ago showed no reoccurrence of dz but did show coronary artery calcifications.  His CP was exertional and relieved with rest. He was supposed to get an ETT done but due to COVID this was cancelled. He ultimately had a coronary CTA 08/2019 that showed  ASCAD with coronary Ca score of 120 and mild CAD of the LAD with 25-49% proximal and mid stenosis and coronary FFR with no hemodynamically flow limiting lesions.   He is here today for followup and is doing well.  He tells me that the first of December he had an episode of chest pain and called 911 because he thought he was having an MI. Chest pain was initially associated with exertion while moving his trash  but subsequently resolved on his way to work but then had another episode while driving.   Due to persistent symptoms, he called EMS.  Upon EMS arrival, he was noted to be in SVT heart rates up to 190s.  He was given adenosine IV 6 mg x 1 with improvement in heart rates to low 100s.  His symptoms of chest discomfort subsequently improved after adenosine.  His hs Trop was mildly elevated at 62 and then 9 (?accuracy).  His K+ was 3.2 at the time and TSH was normal.    He is here today for followup and is doing well.  He  has a very physically stressful job and occasionally will have some DOE and fatigue like he has run too hard.  He has not had any further palpitations since his ER visit.  He tells me that his DOE has actually improved since he last saw me.  He is exercising unloading trucks at work and walks 14,000 steps daily and walks his dog a few times daily with no problems. He denies any PND, orthopnea, LE edema, dizziness, palpitations or syncope. He is compliant with his meds and is tolerating meds with no SE.    Prior CV studies:   The following studies were reviewed today:  Coronary Ca score Coronary CTA 08/2020 Aorta:  Normal size.  Aortic atherosclerosis.  No dissection.  Aortic Valve:  Trileaflet.  No calcifications.  Coronary Arteries:  Normal coronary origin.  Right dominance.  RCA is a large dominant artery that gives rise to PDA and PLVB. There is minimal calcified plaque in the proximal to mid RCA with associated stenosis of 0-24%.  Left main is a large artery that gives rise to LAD and LCX arteries. There is no plaque.  LAD is a large vessel that gives rise to a large 1st diagonal and a second large branding  2nd diagonal. There is mild calcified plaque in the proximal LAD at the takeoff of D1 with associated stenosis of 25-49%. There is mild calcified plaque in the mid LAD with associated stenosis of 25-49%. The distal LAD is poorly visualized.  LCX is a non-dominant artery that gives rise to one large OM1 branch. There is no plaque.  Other findings:  Normal pulmonary vein drainage into the left atrium.  Normal left atrial appendage without a thrombus.  Normal size of the pulmonary artery.  IMPRESSION: 1. Coronary calcium score of 120. This was 66th percentile for age and sex matched control.  2. Normal coronary origin with right dominance.  3. There is mild atherosclerosis of the proximal and mid LAD. CAD-RADS 2.  4. Consider non cardiac causes of chest  pain.  5. This study has been sent for [facility] analysis due to inability to adequately visualize the distal LAD.   Past Medical History:  Diagnosis Date  . AK (actinic keratosis)   . Barrett's esophagus without dysplasia   . Bone pain 06/23/2014  . CAD (coronary artery disease), native coronary artery    coronary CTA 2021 with 25-49% proximal and mid LAD stenosis and coronary Ca score of 120  . Cancer ([facility])   . Constipation 07/11/2014  . Follicular lymphoma grade 3a ([facility]) 06/12/2014  . GERD (gastroesophageal reflux disease)    occ-takes vinagar  . Headache 07/11/2014  . Hypertension   . Insomnia 06/18/2014  . Nausea with vomiting 08/22/2014  . Other fatigue 03/04/2015  . Spinal headache 07/11/2014  . Wears glasses    Past Surgical History:  Procedure Laterality Date  . APPENDECTOMY    . COLONOSCOPY    . LYMPH NODE BIOPSY Right 06/03/2014   Procedure: RIGHT SUPRACLAVICULAR LYMPH NODE BIOPSY;  Surgeon: Adolph Pollack, MD;  Location: [facility];  Service: General;  Laterality: Right;  . PORTACATH PLACEMENT N/A 06/17/2014   Procedure: ULTRASOUND GUIDED PORT-A-CATH INSERTION;  Surgeon: Avel Peace, MD;  Location: [facility] OR;  Service: General;  Laterality: N/A;     Current Meds  Medication Sig  . aspirin EC 81 MG tablet Take 1 tablet (81 mg total) by mouth daily.  Marland Kitchen atorvastatin (LIPITOR) 80 MG tablet Take 1 tablet (80 mg total) by mouth daily.  . cholecalciferol (VITAMIN D) 1000 UNITS tablet Take 2,000 Units by mouth daily.   Marland Kitchen ezetimibe (ZETIA) 10 MG tablet Take 1 tablet (10 mg total) by mouth daily.  Marland Kitchen ibuprofen (ADVIL) 200 MG tablet Take 1 tablet (200 mg total) by mouth every 6 (six) hours as needed.  Marland Kitchen lisinopril (ZESTRIL) 20 MG tablet Take 1 tablet (20 mg total) by mouth daily.  . melatonin 5 MG TABS Take 2 tablets (10 mg total) by mouth at bedtime as needed.  Marland Kitchen omeprazole (PRILOSEC) 20 MG capsule Take 2 capsules (40 mg total) by mouth 2 (two) times daily before a  meal.  . [DISCONTINUED] metoprolol tartrate (LOPRESSOR) 100 MG tablet Take one tablet by mouth 2 hours prior to CT test     Allergies:   Patient has no known allergies.   Social History   Tobacco Use  . Smoking status: Never Smoker  . Smokeless tobacco: Never Used  Vaping Use  . Vaping Use: Never used  Substance Use Topics  . Alcohol use: Yes    Alcohol/week: 21.0 standard drinks    Types: 21 Shots of liquor per week  . Drug use: Yes    Types: Marijuana  Comment: Pt states that he eats marijuana cookies      Family Hx: The patient's family history includes Cancer in his mother; Colon cancer in an other family member; Diabetes in an other family member; Heart disease in his father; Hypertension in an other family member.  ROS:   Please see the history of present illness.     All other systems reviewed and are negative.   Labs/Other Tests and Data Reviewed:    Recent Labs: 09/01/2020: ALT 30; B Natriuretic Peptide 38.4; BUN 15; Creatinine, Ser 0.95; Hemoglobin 13.3; Magnesium 2.1; Platelets 276; Potassium 3.2; Sodium 137; TSH 1.295   Recent Lipid Panel Lab Results  Component Value Date/Time   CHOL 285 (H) 09/01/2020 01:45 PM   CHOL 143 08/20/2019 07:33 AM   TRIG 574 (H) 09/01/2020 01:45 PM   HDL 46 09/01/2020 01:45 PM   HDL 48 08/20/2019 07:33 AM   CHOLHDL 6.2 09/01/2020 01:45 PM   LDLCALC UNABLE TO CALCULATE IF TRIGLYCERIDE OVER 400 mg/dL 09/01/2020 01:45 PM   LDLCALC 81 08/20/2019 07:33 AM   LDLDIRECT 181.3 (H) 09/01/2020 01:45 PM    Wt Readings from Last 3 Encounters:  10/06/20 186 lb (84.4 kg)  09/01/20 180 lb (81.6 kg)  07/09/19 174 lb (78.9 kg)     Objective:    Vital Signs:  BP 130/76   Pulse 89   Ht 5\' 10"  (1.778 m)   Wt 186 lb (84.4 kg)   SpO2 98%   BMI 26.69 kg/m    GEN: Well nourished, well developed in no acute distress HEENT: Normal NECK: No JVD; No carotid bruits LYMPHATICS: No lymphadenopathy CARDIAC:RRR, no murmurs, rubs,  gallops RESPIRATORY:  Clear to auscultation without rales, wheezing or rhonchi  ABDOMEN: Soft, non-tender, non-distended MUSCULOSKELETAL:  No edema; No deformity  SKIN: Warm and dry NEUROLOGIC:  Alert and oriented x 3 PSYCHIATRIC:  Normal affect    ASSESSMENT & PLAN:    1.  ASCAD -coronary Ca score elevated at 120 with 25-49% proximal and mid LAD stenosis -he had an episode of CP in Dec 2021 in setting of SVT with HR 190's that resolved with termination of SVT and no further episodes since then. hsTrop was elevated likely due to demand ischemia -I am concerned that he has been off his atorvastatin for over 9 months and last LDL was very high and he could have had progression of CAD -recommend Lexi myoview to rule out ischemia -Shared Decision Making/Informed Consent The risks [chest pain, shortness of breath, cardiac arrhythmias, dizziness, blood pressure fluctuations, myocardial infarction, stroke/transient ischemic attack, nausea, vomiting, allergic reaction, radiation exposure, metallic taste sensation and life-threatening complications (estimated to be 1 in 10,000)], benefits (risk stratification, diagnosing coronary artery disease, treatment guidance) and alternatives of a nuclear stress test were discussed in detail with Mr. Milici and he agrees to proceed -continue ASA and statin -HbA1C 5.9% in Nov 2021  2.  HTN -BP controlled on exam today -continue Lisinopril 20mg  daily  3.  SOB -suspect that SOB is related to deconditioning in the setting of COVID and DOE has actually improved since I saw him last with exercise -coronary CTA showed mild CAD of the prox and mid LAD 25-49%  -2D echo with normal LVF  4.  HLD -his LDL goal is < 70  -LDL was 181 in Dec 2021 and he had been off his statin for a while but is now back on it -continue Atorvastatin 80mg  daily -repeat FLP in 4 weeks  Medication Adjustments/Labs and Tests Ordered: Current medicines are reviewed at length with  the patient today.  Concerns regarding medicines are outlined above.  Tests Ordered: No orders of the defined types were placed in this encounter.  Medication Changes: No orders of the defined types were placed in this encounter.   Disposition:  Follow up in 1 year(s)  Signed, Fransico Him, MD  10/06/2020 11:39 AM    [facility]

## 2020-10-06 NOTE — Addendum Note (Signed)
Addended by: Theresia Majors on: 10/06/2020 11:50 AM   Modules accepted: Orders

## 2020-10-06 NOTE — Patient Instructions (Signed)
Medication Instructions:  Your physician recommends that you continue on your current medications as directed. Please refer to the Current Medication list given to you today.  *If you need a refill on your cardiac medications before your next appointment, please call your pharmacy*   Lab Work: TODAY: BMET If you have labs (blood work) drawn today and your tests are completely normal, you will receive your results only by: Marland Kitchen MyChart Message (if you have MyChart) OR . A paper copy in the mail If you have any lab test that is abnormal or we need to change your treatment, we will call you to review the results.   Testing/Procedures: Your physician has requested that you have a lexiscan myoview. For further information please visit [email or web address] Please follow instruction sheet, as given.  Follow-Up: At [facility], you and your health needs are our priority.  As part of our continuing mission to provide you with exceptional heart care, we have created designated Provider Care Teams.  These Care Teams include your primary Cardiologist (physician) and Advanced Practice Providers (APPs -  Physician Assistants and Nurse Practitioners) who all work together to provide you with the care you need, when you need it.  Your next appointment:   1 year(s)  The format for your next appointment:   In Person  Provider:   You may see Armanda Magic, MD or one of the following Advanced Practice Providers on your designated Care Team:    Ronie Spies, PA-C  Jacolyn Reedy, PA-C

## 2020-10-08 ENCOUNTER — Other Ambulatory Visit: Payer: Self-pay

## 2020-10-08 DIAGNOSIS — I2510 Atherosclerotic heart disease of native coronary artery without angina pectoris: Secondary | ICD-10-CM

## 2020-10-09 ENCOUNTER — Ambulatory Visit: Payer: BC Managed Care – PPO | Admitting: Cardiology

## 2020-10-13 NOTE — Addendum Note (Signed)
Addended by: Sueanne Margarita on: 10/13/2020 08:47 AM   Modules accepted: Orders

## 2020-10-14 ENCOUNTER — Telehealth (HOSPITAL_COMMUNITY): Payer: Self-pay | Admitting: *Deleted

## 2020-10-14 NOTE — Telephone Encounter (Signed)
Left message on voicemail per DPR in reference to upcoming appointment scheduled on 10/21/2020 at [facility] with detailed instructions given per Myocardial Perfusion Study Information Sheet for the test. LM to arrive 15 minutes early, and that it is imperative to arrive on time for appointment to keep from having the test rescheduled. If you need to cancel or reschedule your appointment, please call the office within 24 hours of your appointment. Failure to do so may result in a cancellation of your appointment, and a $50 no show fee. Phone number given for call back for any questions. No mychart available.Tychelle Purkey, Ranae Palms

## 2020-10-21 ENCOUNTER — Telehealth (HOSPITAL_COMMUNITY): Payer: Self-pay

## 2020-10-21 ENCOUNTER — Encounter (HOSPITAL_COMMUNITY): Payer: BC Managed Care – PPO

## 2020-10-21 NOTE — Telephone Encounter (Signed)
Spoke with the patient, detailed instructions given. He stated that he understood and would be here for his test. Asked to call back with any questions. S.Emma Birchler EMTP 

## 2020-10-22 ENCOUNTER — Other Ambulatory Visit: Payer: Self-pay

## 2020-10-22 ENCOUNTER — Ambulatory Visit (HOSPITAL_COMMUNITY): Payer: BC Managed Care – PPO | Attending: Cardiology

## 2020-10-22 DIAGNOSIS — R072 Precordial pain: Secondary | ICD-10-CM | POA: Diagnosis not present

## 2020-10-22 LAB — MYOCARDIAL PERFUSION IMAGING
LV dias vol: 97 mL (ref 62–150)
LV sys vol: 34 mL
Peak HR: 102 {beats}/min
Rest HR: 67 {beats}/min
SDS: 0
SRS: 0
SSS: 0
TID: 1.08

## 2020-10-22 MED ORDER — TECHNETIUM TC 99M TETROFOSMIN IV KIT
10.9000 | PACK | Freq: Once | INTRAVENOUS | Status: AC | PRN
  Administered 2020-10-22: 10.9 via INTRAVENOUS
  Filled 2020-10-22: qty 11

## 2020-10-22 MED ORDER — TECHNETIUM TC 99M TETROFOSMIN IV KIT
31.2000 | PACK | Freq: Once | INTRAVENOUS | Status: AC | PRN
  Administered 2020-10-22: 31.2 via INTRAVENOUS
  Filled 2020-10-22: qty 32

## 2020-10-22 MED ORDER — REGADENOSON 0.4 MG/5ML IV SOLN
0.4000 mg | Freq: Once | INTRAVENOUS | Status: AC
  Administered 2020-10-22: 0.4 mg via INTRAVENOUS

## 2020-10-25 ENCOUNTER — Other Ambulatory Visit: Payer: Self-pay | Admitting: Cardiology

## 2020-10-30 ENCOUNTER — Other Ambulatory Visit: Payer: Self-pay

## 2020-10-30 MED ORDER — EZETIMIBE 10 MG PO TABS: 10.0000 mg | ORAL_TABLET | Freq: Every day | ORAL | 3 refills | Status: DC

## 2020-10-30 MED ORDER — ATORVASTATIN CALCIUM 80 MG PO TABS: 80.0000 mg | ORAL_TABLET | Freq: Every day | ORAL | 3 refills | Status: DC

## 2020-11-23 ENCOUNTER — Other Ambulatory Visit: Payer: Self-pay

## 2020-11-23 MED ORDER — LISINOPRIL 20 MG PO TABS: 20.0000 mg | ORAL_TABLET | Freq: Every day | ORAL | 3 refills | Status: DC

## 2021-01-29 DIAGNOSIS — S83412A Sprain of medial collateral ligament of left knee, initial encounter: Secondary | ICD-10-CM | POA: Diagnosis not present

## 2021-10-11 ENCOUNTER — Other Ambulatory Visit: Payer: Self-pay | Admitting: Cardiology

## 2021-10-29 ENCOUNTER — Other Ambulatory Visit: Payer: Self-pay | Admitting: Cardiology

## 2021-12-15 ENCOUNTER — Other Ambulatory Visit: Payer: Self-pay | Admitting: *Deleted

## 2021-12-15 MED ORDER — LISINOPRIL 20 MG PO TABS: 20.0000 mg | ORAL_TABLET | Freq: Every day | ORAL | 0 refills | Status: DC

## 2021-12-15 NOTE — Telephone Encounter (Signed)
Pt has been scheduled to see Christen Bame, NP, 12/24/21, refill for Lisinopril has been sent in for 30 days. ? ?

## 2021-12-17 ENCOUNTER — Ambulatory Visit: Payer: BC Managed Care – PPO | Admitting: Cardiology

## 2021-12-24 ENCOUNTER — Ambulatory Visit: Payer: 59 | Admitting: Nurse Practitioner

## 2021-12-24 ENCOUNTER — Other Ambulatory Visit: Payer: Self-pay

## 2021-12-24 ENCOUNTER — Encounter: Payer: Self-pay | Admitting: Nurse Practitioner

## 2021-12-24 VITALS — BP 136/76 | HR 87 | Ht 70.0 in | Wt 179.0 lb

## 2021-12-24 DIAGNOSIS — E785 Hyperlipidemia, unspecified: Secondary | ICD-10-CM

## 2021-12-24 DIAGNOSIS — I10 Essential (primary) hypertension: Secondary | ICD-10-CM

## 2021-12-24 DIAGNOSIS — E781 Pure hyperglyceridemia: Secondary | ICD-10-CM

## 2021-12-24 DIAGNOSIS — R0609 Other forms of dyspnea: Secondary | ICD-10-CM

## 2021-12-24 DIAGNOSIS — I471 Supraventricular tachycardia: Secondary | ICD-10-CM | POA: Diagnosis not present

## 2021-12-24 DIAGNOSIS — I2583 Coronary atherosclerosis due to lipid rich plaque: Secondary | ICD-10-CM

## 2021-12-24 DIAGNOSIS — I2510 Atherosclerotic heart disease of native coronary artery without angina pectoris: Secondary | ICD-10-CM

## 2021-12-24 LAB — LIPID PANEL
Chol/HDL Ratio: 2.3 ratio (ref 0.0–5.0)
Cholesterol, Total: 122 mg/dL (ref 100–199)
HDL: 54 mg/dL (ref >39)
LDL Chol Calc (NIH): 57 mg/dL (ref 0–99)
Triglycerides: 48 mg/dL (ref 0–149)
VLDL Cholesterol Cal: 11 mg/dL (ref 5–40)

## 2021-12-24 LAB — COMPREHENSIVE METABOLIC PANEL
ALT: 21 IU/L (ref 0–44)
AST: 19 IU/L (ref 0–40)
Albumin/Globulin Ratio: 1.9 (ref 1.2–2.2)
Albumin: 4.4 g/dL (ref 3.8–4.8)
Alkaline Phosphatase: 54 IU/L (ref 44–121)
BUN/Creatinine Ratio: 14 (ref 10–24)
BUN: 12 mg/dL (ref 8–27)
Bilirubin Total: 0.4 mg/dL (ref 0.0–1.2)
CO2: 27 mmol/L (ref 20–29)
Calcium: 9.2 mg/dL (ref 8.6–10.2)
Chloride: 100 mmol/L (ref 96–106)
Creatinine, Ser: 0.88 mg/dL (ref 0.76–1.27)
Globulin, Total: 2.3 g/dL (ref 1.5–4.5)
Glucose: 97 mg/dL (ref 70–99)
Potassium: 4.2 mmol/L (ref 3.5–5.2)
Sodium: 139 mmol/L (ref 134–144)
Total Protein: 6.7 g/dL (ref 6.0–8.5)
eGFR: 96 mL/min/{1.73_m2} (ref >59)

## 2021-12-24 MED ORDER — EZETIMIBE 10 MG PO TABS: 10.0000 mg | ORAL_TABLET | Freq: Every day | ORAL | 3 refills | Status: DC

## 2021-12-24 MED ORDER — ATORVASTATIN CALCIUM 80 MG PO TABS: 80.0000 mg | ORAL_TABLET | Freq: Every day | ORAL | 3 refills | Status: DC

## 2021-12-24 MED ORDER — LISINOPRIL 20 MG PO TABS: 20.0000 mg | ORAL_TABLET | Freq: Every day | ORAL | 3 refills | Status: DC

## 2021-12-24 NOTE — Progress Notes (Signed)
?Cardiology Office Note:   ? ?Date:  12/25/2021  ? ?ID:  [name], DOB 17-Mar-1956, MRN [id number] ? ?PCP:  Heywood Bene, PA-C ?  ?[facility] Providers ?Cardiologist:  Fransico Him, MD    ? ?Referring MD: Heywood Bene, *  ? ?Chief complaint: annual follow-up CAD, PSVT ? ?History of Present Illness:   ? ?Erik Crosby is a 66 y.o. male with a hx of paroxysmal SVT, hypertension, CAD, elevated coronary calcium score, and hyperlipidemia.  ? ?He had a coronary CTA 08/2019 that showed ASCAD with coronary calcium score of 120 and mid CAD of the LAD with 25-49% proximal and mid stenosis and coronary FFR with no hemodynamically flow limiting lesions. On 09/01/20 he was transported to [facility] ED after calling EMS for chest pain over the previous 2 days, worse with exertion. He was found to be in SVT with heart rates up to 190s.  He was given adenosine IV 6 mg x 1 with improvement in heart rate to low 100s.  His symptoms of chest discomfort subsequently improved after adenosine. EKG revealed no concerns for ischemia. TSH was normal.  Potassium was low at 3.2. 2nd hs troponin was mildly elevated at 62 and no further testing was recommended by Dr. Garen Lah. He was advised to follow-up as OP.  ? ?He was last seen in our office on 10/06/20 by Dr. Radford Pax at which time he reported no further episodes of SVT.  He was walking 14,000 steps daily, exercising and unloading trucks at work with no symptoms of chest pain, palpitations, dyspnea.  Lexiscan Myoview was ordered to rule out ischemia and was a low risk study.  He had very elevated triglycerides at 574, LDL 181.  He had stopped his statin for a while but was advised to restart atorvastatin 80 mg. There is no record of repeat lipid panel.  ? ?Today, he is here alone for follow-up.  He reports no further episodes of SVT. Reports a feeling of tightness in his upper chest like he is unable to get a deep breath most noticeably when he is climbing  stairs. It is not always present with exertion. States, "some days I feel like I could run a marathon and other days I struggle to make it up the stairs." Admits that he rarely drinks water. Has noted higher diastolic readings than what we got today (86 mmHg). Has retired so he has not been as active over the past year. He lower extremity edema, fatigue, palpitations, melena, hematuria, hemoptysis, diaphoresis, weakness, presyncope, syncope, orthopnea, and PND. ? ? ?Past Medical History:  ?Diagnosis Date  ? AK (actinic keratosis)   ? Barrett's esophagus without dysplasia   ? Bone pain 06/23/2014  ? CAD (coronary artery disease), native coronary artery   ? coronary CTA 2021 with 25-49% proximal and mid LAD stenosis and coronary Ca score of 120  ? Cancer [facility])   ? Constipation 07/11/2014  ? Follicular lymphoma grade 3a ([place]) 06/12/2014  ? GERD (gastroesophageal reflux disease)   ? occ-takes vinagar  ? Headache 07/11/2014  ? Hypertension   ? Insomnia 06/18/2014  ? Other fatigue 03/04/2015  ? Spinal headache 07/11/2014  ? SVT (supraventricular tachycardia) ([place]) 09/2020  ? Wears glasses   ? ? ?Past Surgical History:  ?Procedure Laterality Date  ? APPENDECTOMY    ? COLONOSCOPY    ? LYMPH NODE BIOPSY Right 06/03/2014  ? Procedure: RIGHT SUPRACLAVICULAR LYMPH NODE BIOPSY;  Surgeon: Odis Hollingshead, MD;  Location: [facility];  Service: General;  Laterality: Right;  ? PORTACATH PLACEMENT N/A 06/17/2014  ? Procedure: ULTRASOUND GUIDED PORT-A-CATH INSERTION;  Surgeon: Jackolyn Confer, MD;  Location: [place];  Service: General;  Laterality: N/A;  ? ? ?Current Medications: ?Current Meds  ?Medication Sig  ? aspirin EC 81 MG tablet Take 1 tablet (81 mg total) by mouth daily.  ? cholecalciferol (VITAMIN D) 1000 UNITS tablet Take 2,000 Units by mouth daily.   ? ibuprofen (ADVIL) 200 MG tablet Take 1 tablet (200 mg total) by mouth every 6 (six) hours as needed.  ? melatonin 5 MG TABS Take 2 tablets (10 mg total) by mouth at  bedtime as needed.  ? omeprazole (PRILOSEC) 20 MG capsule Take 2 capsules (40 mg total) by mouth 2 (two) times daily before a meal.  ? [DISCONTINUED] atorvastatin (LIPITOR) 80 MG tablet Take 1 tablet (80 mg total) by mouth daily. Pt needs to make appt with provider for further refills - 1st attempt  ? [DISCONTINUED] ezetimibe (ZETIA) 10 MG tablet TAKE 1 TABLET DAILY  ? [DISCONTINUED] lisinopril (ZESTRIL) 20 MG tablet Take 1 tablet (20 mg total) by mouth daily.  ?  ? ?Allergies:   Patient has no known allergies.  ? ?Social History  ? ?Socioeconomic History  ? Marital status: Married  ?  Spouse name: Not on file  ? Number of children: Not on file  ? Years of education: Not on file  ? Highest education level: Not on file  ?Occupational History  ? Not on file  ?Tobacco Use  ? Smoking status: Never  ? Smokeless tobacco: Never  ?Vaping Use  ? Vaping Use: Never used  ?Substance and Sexual Activity  ? Alcohol use: Yes  ?  Alcohol/week: 21.0 standard drinks  ?  Types: 21 Shots of liquor per week  ? Drug use: Yes  ?  Types: Marijuana  ?  Comment: Pt states that he eats marijuana cookies   ? Sexual activity: Not on file  ?Other Topics Concern  ? Not on file  ?Social History Narrative  ? Not on file  ? ?Social Determinants of Health  ? ?Financial Resource Strain: Not on file  ?Food Insecurity: Not on file  ?Transportation Needs: Not on file  ?Physical Activity: Not on file  ?Stress: Not on file  ?Social Connections: Not on file  ?  ? ?Family History: ?The patient's family history includes Cancer in his mother; Colon cancer in an other family member; Diabetes in an other family member; Heart disease in his father; Hypertension in an other family member. ? ?ROS:   ?Please see the history of present illness.    ?+ shortness of breath ?All other systems reviewed and are negative. ? ?Labs/Other Studies Reviewed:   ? ?The following studies were reviewed today: ? ?Lexiscan myoview 10/22/20 ? ?The left ventricular ejection fraction is  normal (55-65%). ?Nuclear stress EF: 65%. ?There was no ST segment deviation noted during stress. ?This is a low risk study. ?  ? ?Coronary CT 08/2019 ? ?1. Coronary calcium score of 120. This was 66th percentile for age ?and sex matched control. ?  ?2. Normal coronary origin with right dominance. ?  ?3. There is mild atherosclerosis of the proximal and mid LAD. ?CAD-RADS 2. ?  ?4. Consider non cardiac causes of chest pain. ?  ?5. This study has been sent for [facility] analysis due to inability to ?adequately visualize the distal LAD. ?Aorta:  Normal size.  Aortic atherosclerosis.  No dissection. ?  ?  Aortic Valve:  Trileaflet.  No calcifications. ?Coronary Arteries:  Normal coronary origin.  Right dominance. ?RCA is a large dominant artery that gives rise to PDA and PLVB. ?There is minimal calcified plaque in the proximal to mid RCA with ?associated stenosis of 0-24%. ?Left main is a large artery that gives rise to LAD and LCX arteries. ?There is no plaque. ?LAD is a large vessel that gives rise to a large 1st diagonal and a ?second large branding 2nd diagonal. There is mild calcified plaque ?in the proximal LAD at the takeoff of D1 with associated stenosis of ?25-49%. There is mild calcified plaque in the mid LAD with ?associated stenosis of 25-49%. The distal LAD is poorly visualized. ?LCX is a non-dominant artery that gives rise to one large OM1 ?branch. There is no plaque. ?  ?Other findings: ?  ?Normal pulmonary vein drainage into the left atrium. ?Normal left atrial appendage without a thrombus. ?Normal size of the pulmonary artery. ? ?IMPRESSION: ?1. Coronary calcium score of 120. This was 66th percentile for age ?and sex matched control. ?  ?2. Normal coronary origin with right dominance. ?  ?3. There is mild atherosclerosis of the proximal and mid LAD. ?CAD-RADS 2. ? ?Echo 05/2019 ? ?Left Ventricle: The left ventricle has normal systolic function, with an  ?ejection fraction of 60-65%. The cavity size was normal.  There is no  ?increase in left ventricular wall thickness. Left ventricular diastolic  ?parameters were normal. Normal left  ?ventricular filling pressures No evidence of left ventricular regional  ?wall motion

## 2021-12-24 NOTE — Patient Instructions (Signed)
Medication Instructions:  ?Your physician recommends that you continue on your current medications as directed. Please refer to the Current Medication list given to you today. ? ?*If you need a refill on your cardiac medications before your next appointment, please call your pharmacy* ? ? ?Lab Work: ?TODAY: CMET, FLP ? ?If you have labs (blood work) drawn today and your tests are completely normal, you will receive your results only by: ?MyChart Message (if you have MyChart) OR ?A paper copy in the mail ?If you have any lab test that is abnormal or we need to change your treatment, we will call you to review the results. ? ? ?Follow-Up: ?At [facility], you and your health needs are our priority.  As part of our continuing mission to provide you with exceptional heart care, we have created designated Provider Care Teams.  These Care Teams include your primary Cardiologist (physician) and Advanced Practice Providers (APPs -  Physician Assistants and Nurse Practitioners) who all work together to provide you with the care you need, when you need it. ? ?We recommend signing up for the patient portal called "MyChart".  Sign up information is provided on this After Visit Summary.  MyChart is used to connect with patients for Virtual Visits (Telemedicine).  Patients are able to view lab/test results, encounter notes, upcoming appointments, etc.  Non-urgent messages can be sent to your provider as well.   ?To learn more about what you can do with MyChart, go to [email or web address].   ? ?Your next appointment:   ?1 year(s) ? ?The format for your next appointment:   ?In Person ? ?Provider:   ?Fransico Him, MD  ? ? ?Other Instructions ?Increase water intake. Drink atleast 64oz daily. ?Call us with Shortness of Breath or Heart Rate concerns. ?

## 2021-12-25 ENCOUNTER — Encounter: Payer: Self-pay | Admitting: Nurse Practitioner

## 2022-04-14 ENCOUNTER — Ambulatory Visit
Admission: RE | Admit: 2022-04-14 | Discharge: 2022-04-14 | Disposition: A | Discharge status: Home / Self Care | Payer: Medicare Other | Source: Ambulatory Visit | Attending: Internal Medicine | Admitting: Internal Medicine

## 2022-04-14 ENCOUNTER — Other Ambulatory Visit: Payer: Self-pay | Admitting: Internal Medicine

## 2022-04-14 DIAGNOSIS — R0609 Other forms of dyspnea: Secondary | ICD-10-CM

## 2022-06-07 NOTE — Progress Notes (Unsigned)
Synopsis: Referred for DOE by Heywood Bene, *  Subjective:   PATIENT ID: Erik Crosby GENDER: male DOB: [birth year], MRN: [id number]  No chief complaint on file.  16yM with history of CAD, SVT, HTN, GERD/barrett's esophagus, follicular lymphoma, never smoker referred for DOE  Otherwise pertinent review of systems is negative.  Past Medical History:  Diagnosis Date   AK (actinic keratosis)    Barrett's esophagus without dysplasia    Bone pain 06/23/2014   CAD (coronary artery disease), native coronary artery    coronary CTA 2021 with 25-49% proximal and mid LAD stenosis and coronary Ca score of 120   Cancer (HCC)    Constipation 87/05/6766   Follicular lymphoma grade 3a ([place]) 06/12/2014   GERD (gastroesophageal reflux disease)    occ-takes vinagar   Headache 07/11/2014   Hypertension    Insomnia 06/18/2014   Other fatigue 03/04/2015   Spinal headache 07/11/2014   SVT (supraventricular tachycardia) ([place]) 09/2020   Wears glasses      Family History  Problem Relation Age of Onset   Cancer Mother        colon ca & pancreatic ca   Heart disease Father    Hypertension Other    Colon cancer Other    Diabetes Other      Past Surgical History:  Procedure Laterality Date   APPENDECTOMY     COLONOSCOPY     LYMPH NODE BIOPSY Right 06/03/2014   Procedure: RIGHT SUPRACLAVICULAR LYMPH NODE BIOPSY;  Surgeon: Odis Hollingshead, MD;  Location: [place];  Service: General;  Laterality: Right;   PORTACATH PLACEMENT N/A 06/17/2014   Procedure: ULTRASOUND GUIDED PORT-A-CATH INSERTION;  Surgeon: Jackolyn Confer, MD;  Location: [place];  Service: General;  Laterality: N/A;    Social History   Socioeconomic History   Marital status: Married    Spouse name: Not on file   Number of children: Not on file   Years of education: Not on file   Highest education level: Not on file  Occupational History   Not on file  Tobacco Use   Smoking status: Never   Smokeless  tobacco: Never  Vaping Use   Vaping Use: Never used  Substance and Sexual Activity   Alcohol use: Yes    Alcohol/week: 21.0 standard drinks of alcohol    Types: 21 Shots of liquor per week   Drug use: Yes    Types: Marijuana    Comment: Pt states that he eats marijuana cookies    Sexual activity: Not on file  Other Topics Concern   Not on file  Social History Narrative   Not on file   Social Determinants of Health   Financial Resource Strain: Not on file  Food Insecurity: Not on file  Transportation Needs: Not on file  Physical Activity: Not on file  Stress: Not on file  Social Connections: Not on file  Intimate Partner Violence: Not on file     No Known Allergies   Outpatient Medications Prior to Visit  Medication Sig Dispense Refill   aspirin EC 81 MG tablet Take 1 tablet (81 mg total) by mouth daily. 90 tablet 3   atorvastatin (LIPITOR) 80 MG tablet Take 1 tablet (80 mg total) by mouth daily. 90 tablet 3   cholecalciferol (VITAMIN D) 1000 UNITS tablet Take 2,000 Units by mouth daily.      ezetimibe (ZETIA) 10 MG tablet Take 1 tablet (10 mg total) by mouth daily. 90 tablet 3  ibuprofen (ADVIL) 200 MG tablet Take 1 tablet (200 mg total) by mouth every 6 (six) hours as needed. 30 tablet 0   lisinopril (ZESTRIL) 20 MG tablet Take 1 tablet (20 mg total) by mouth daily. 90 tablet 3   melatonin 5 MG TABS Take 2 tablets (10 mg total) by mouth at bedtime as needed. 30 tablet 0   omeprazole (PRILOSEC) 20 MG capsule Take 2 capsules (40 mg total) by mouth 2 (two) times daily before a meal. 30 capsule 0   No facility-administered medications prior to visit.       Objective:   Physical Exam:  General appearance: 66 y.o., male, NAD, conversant  Eyes: anicteric sclerae; PERRL, tracking appropriately HENT: NCAT; MMM Neck: Trachea midline; no lymphadenopathy, no JVD Lungs: CTAB, no crackles, no wheeze, with normal respiratory effort CV: RRR, no murmur  Abdomen: Soft,  non-tender; non-distended, BS present  Extremities: No peripheral edema, warm Skin: Normal turgor and texture; no rash Psych: Appropriate affect Neuro: Alert and oriented to person and place, no focal deficit     There were no vitals filed for this visit.   on *** LPM *** RA BMI Readings from Last 3 Encounters:  12/24/21 25.68 kg/m  10/22/20 26.69 kg/m  10/06/20 26.69 kg/m   Wt Readings from Last 3 Encounters:  12/24/21 179 lb (81.2 kg)  10/22/20 186 lb (84.4 kg)  10/06/20 186 lb (84.4 kg)     CBC    Component Value Date/Time   WBC 5.8 09/01/2020 1345   RBC 5.38 09/01/2020 1345   HGB 13.3 09/01/2020 1345   HGB 13.8 09/15/2017 0935   HCT 42.8 09/01/2020 1345   HCT 43.0 09/15/2017 0935   PLT 276 09/01/2020 1345   PLT 240 09/15/2017 0935   MCV 79.6 (L) 09/01/2020 1345   MCV 90.1 09/15/2017 0935   MCH 24.7 (L) 09/01/2020 1345   MCHC 31.1 09/01/2020 1345   RDW 15.9 (H) 09/01/2020 1345   RDW 13.9 09/15/2017 0935   LYMPHSABS 2.1 09/01/2020 1345   LYMPHSABS 1.1 09/15/2017 0935   MONOABS 0.6 09/01/2020 1345   MONOABS 0.4 09/15/2017 0935   EOSABS 0.1 09/01/2020 1345   EOSABS 0.1 09/15/2017 0935   BASOSABS 0.0 09/01/2020 1345   BASOSABS 0.0 09/15/2017 0935    ***  Chest Imaging: CXR 04/14/22 reviewed by me unremarkable  CT Coronaries lung bases 2020 reviewed by me unremarkable, coronary results as below: 1. Coronary calcium score of 120. This was 66th percentile for age and sex matched control. 2. Normal coronary origin with right dominance. 3. There is mild atherosclerosis of the proximal and mid LAD. CAD-RADS 2. 4. Consider non cardiac causes of chest pain. 5. This study has been sent for [facility] analysis due to inability to adequately visualize the distal LAD. No flow limiting lesions noted on FFR analysis  Pulmonary Functions Testing Results:     No data to display            Echocardiogram:   TTE 2020:  1. The left ventricle has normal systolic  function with an ejection  fraction of 60-65%. The cavity size was normal. Left ventricular diastolic  parameters were normal. No evidence of left ventricular regional wall  motion abnormalities.   2. The right ventricle has normal systolic function. The cavity was  normal. There is no increase in right ventricular wall thickness. Right  ventricular systolic pressure is normal with an estimated pressure of 26.0  mmHg.   3. Trivial pericardial effusion is  present.   4. Mild thickening of the mitral valve leaflet. The MR jet is  anteriorly-directed.   5. There is mild PMVL prolapse. There is mild, anteriorly-directed  regurgitation which occurs in late systole.   6. The aorta is normal unless otherwise noted.   7. The aortic root and ascending aorta are normal in size and structure.   8. There is evidence of plaque in the aortic root.   Heart Catheterization: ***    Assessment & Plan:    Plan: - PFTs - update labs***     Maryjane Hurter, MD [facility] 06/07/2022 1:44 PM

## 2022-06-08 ENCOUNTER — Ambulatory Visit (INDEPENDENT_AMBULATORY_CARE_PROVIDER_SITE_OTHER): Payer: Medicare Other | Admitting: Student

## 2022-06-08 ENCOUNTER — Encounter: Payer: Self-pay | Admitting: Student

## 2022-06-08 VITALS — BP 154/88 | HR 86 | Temp 98.5°F | Ht 70.0 in | Wt 190.0 lb

## 2022-06-08 DIAGNOSIS — R0609 Other forms of dyspnea: Secondary | ICD-10-CM

## 2022-06-08 DIAGNOSIS — R053 Chronic cough: Secondary | ICD-10-CM | POA: Diagnosis not present

## 2022-06-08 MED ORDER — ALBUTEROL SULFATE HFA 108 (90 BASE) MCG/ACT IN AERS: 2.0000 | INHALATION_SPRAY | Freq: Four times a day (QID) | RESPIRATORY_TRACT | 11 refills | Status: AC | PRN

## 2022-06-08 NOTE — Patient Instructions (Signed)
-   we will schedule PFTs (breathing tests) and then clinic visit right after to discuss results. If you have any questions about your results before clinic visit then send me a my chart message or call [phone].  - if cough super bothersome then would consider alternative blood pressure medication in discussion with your PCP. Otherwise would wait until after PFTs. - try albuterol 1-2 puffs 5-20 minutes before heavy exertion and every 4-6 hours as needed (at most) - see you soon!

## 2022-08-04 NOTE — Progress Notes (Signed)
Synopsis: Referred for DOE by Heywood Bene, *  Subjective:   PATIENT ID: Erik Crosby GENDER: male DOB: July 13, 1956, MRN: [id number]  Chief Complaint  Patient presents with   Follow-up   66yM with history of CAD, SVT, HTN, GERD/barrett's esophagus, follicular lymphoma - (R-CHOP with IT MTX), covid-19 several times (last 1.5 ya - never severe course), never smoker referred for DOE, cough  He says he's had baseline DOE some episodic worsening, CP over last several years. Has had negative ischemic workup with cardiology, echo looked ok for the most part 2020. He is less active since retiring and has gained 15-20 lb and wonders if there is some role that deconditioning is playing. He has no orthopnea. He has tried his wife's inhaler periodically but doesn't always notice major effect. Has not had course of prednisone for this.   He snores, no PND, no excessive daytime sleepiness.   Never had asthma growing up.  Cough is dry, comes and goes. Notes some postnasal drainage, possible seasonal allergy. He is on lisinopril and wonders if it may have had effect (has been on it for last 6y). He has heartburn, takes omeprazole 40-80 mg daily.   He has no family history of lung disease.  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 No vaping or MJ. He has a cat, dog. He has lived in [place], [place].   Interval HPI:  PFTs today normal.   Still has episodic DOE, CP. Albuterol helps.   Otherwise pertinent review of systems is negative.   Past Medical History:  Diagnosis Date   AK (actinic keratosis)    Barrett's esophagus without dysplasia    Bone pain 06/23/2014   CAD (coronary artery disease), native coronary artery    coronary CTA 2021 with 25-49% proximal and mid LAD stenosis and coronary Ca score of 120   Cancer (HCC)    Constipation 93/02/7016   Follicular lymphoma grade 3a ([place]) 06/12/2014   GERD (gastroesophageal reflux disease)    occ-takes vinagar   Headache 07/11/2014    Hypertension    Insomnia 06/18/2014   Other fatigue 03/04/2015   Spinal headache 07/11/2014   SVT (supraventricular tachycardia) 09/2020   Wears glasses      Family History  Problem Relation Age of Onset   Cancer Mother        colon ca & pancreatic ca   Heart disease Father    Hypertension Other    Colon cancer Other    Diabetes Other      Past Surgical History:  Procedure Laterality Date   APPENDECTOMY     COLONOSCOPY     LYMPH NODE BIOPSY Right 06/03/2014   Procedure: RIGHT SUPRACLAVICULAR LYMPH NODE BIOPSY;  Surgeon: Odis Hollingshead, MD;  Location: [facility];  Service: General;  Laterality: Right;   PORTACATH PLACEMENT N/A 06/17/2014   Procedure: ULTRASOUND GUIDED PORT-A-CATH INSERTION;  Surgeon: Jackolyn Confer, MD;  Location: [facility];  Service: General;  Laterality: N/A;    Social History   Socioeconomic History   Marital status: Married    Spouse name: Not on file   Number of children: Not on file   Years of education: Not on file   Highest education level: Not on file  Occupational History   Not on file  Tobacco Use   Smoking status: Never   Smokeless tobacco: Never  Vaping Use   Vaping Use: Never used  Substance and Sexual Activity   Alcohol use: Yes  Alcohol/week: 21.0 standard drinks of alcohol    Types: 21 Shots of liquor per week   Drug use: Yes    Types: Marijuana    Comment: Pt states that he eats marijuana cookies    Sexual activity: Not on file  Other Topics Concern   Not on file  Social History Narrative   Not on file   Social Determinants of Health   Financial Resource Strain: Not on file  Food Insecurity: Not on file  Transportation Needs: Not on file  Physical Activity: Not on file  Stress: Not on file  Social Connections: Not on file  Intimate Partner Violence: Not on file     No Known Allergies   Outpatient Medications Prior to Visit  Medication Sig Dispense Refill   albuterol (VENTOLIN HFA) 108 (90 Base) MCG/ACT  inhaler Inhale 2 puffs into the lungs every 6 (six) hours as needed for wheezing or shortness of breath. 8 g 11   atorvastatin (LIPITOR) 80 MG tablet Take 1 tablet (80 mg total) by mouth daily. 90 tablet 3   cholecalciferol (VITAMIN D) 1000 UNITS tablet Take 2,000 Units by mouth daily.      ezetimibe (ZETIA) 10 MG tablet Take 1 tablet (10 mg total) by mouth daily. 90 tablet 3   ibuprofen (ADVIL) 200 MG tablet Take 1 tablet (200 mg total) by mouth every 6 (six) hours as needed. 30 tablet 0   lisinopril (ZESTRIL) 20 MG tablet Take 1 tablet (20 mg total) by mouth daily. 90 tablet 3   melatonin 5 MG TABS Take 2 tablets (10 mg total) by mouth at bedtime as needed. 30 tablet 0   omeprazole (PRILOSEC) 20 MG capsule Take 2 capsules (40 mg total) by mouth 2 (two) times daily before a meal. 30 capsule 0   No facility-administered medications prior to visit.       Objective:   Physical Exam:  General appearance: 66 y.o., male, NAD, conversant  Eyes: anicteric sclerae; PERRL, tracking appropriately HENT: NCAT; MMM Neck: Trachea midline; no lymphadenopathy, no JVD Lungs: CTAB, no crackles, no wheeze, with normal respiratory effort CV: RRR, +systolic murmur Abdomen: Soft, non-tender; non-distended, BS present  Extremities: No peripheral edema, warm Skin: Normal turgor and texture; no rash Psych: Appropriate affect Neuro: Alert and oriented to person and place, no focal deficit     Vitals:   08/09/22 1000  BP: 132/78  Pulse: 99  SpO2: 94%  Weight: 190 lb (86.2 kg)  Height: '5\' 9"'$  (1.753 m)    94% on RA BMI Readings from Last 3 Encounters:  08/09/22 28.06 kg/m  06/08/22 27.26 kg/m  12/24/21 25.68 kg/m   Wt Readings from Last 3 Encounters:  08/09/22 190 lb (86.2 kg)  06/08/22 190 lb (86.2 kg)  12/24/21 179 lb (81.2 kg)     CBC    Component Value Date/Time   WBC 5.8 09/01/2020 1345   RBC 5.38 09/01/2020 1345   HGB 13.3 09/01/2020 1345   HGB 13.8 09/15/2017 0935   HCT  42.8 09/01/2020 1345   HCT 43.0 09/15/2017 0935   PLT 276 09/01/2020 1345   PLT 240 09/15/2017 0935   MCV 79.6 (L) 09/01/2020 1345   MCV 90.1 09/15/2017 0935   MCH 24.7 (L) 09/01/2020 1345   MCHC 31.1 09/01/2020 1345   RDW 15.9 (H) 09/01/2020 1345   RDW 13.9 09/15/2017 0935   LYMPHSABS 2.1 09/01/2020 1345   LYMPHSABS 1.1 09/15/2017 0935   MONOABS 0.6 09/01/2020 1345   MONOABS 0.4  09/15/2017 0935   EOSABS 0.1 09/01/2020 1345   EOSABS 0.1 09/15/2017 0935   BASOSABS 0.0 09/01/2020 1345   BASOSABS 0.0 09/15/2017 0935      Chest Imaging: CXR 04/14/22 reviewed by me unremarkable  CT Coronaries lung bases 2020 reviewed by me unremarkable, coronary results as below: 1. Coronary calcium score of 120. This was 66th percentile for age and sex matched control. 2. Normal coronary origin with right dominance. 3. There is mild atherosclerosis of the proximal and mid LAD. CAD-RADS 2. 4. Consider non cardiac causes of chest pain. 5. This study has been sent for [facility] analysis due to inability to adequately visualize the distal LAD. No flow limiting lesions noted on FFR analysis  Pulmonary Functions Testing Results:    Latest Ref Rng & Units 08/09/2022    8:53 AM  PFT Results  FVC-Pre L 4.74  P  FVC-Predicted Pre % 104  P  FVC-Post L 4.72  P  FVC-Predicted Post % 104  P  Pre FEV1/FVC % % 84  P  Post FEV1/FCV % % 85  P  FEV1-Pre L 3.97  P  FEV1-Predicted Pre % 117  P  FEV1-Post L 4.00  P  DLCO uncorrected ml/min/mmHg 22.51  P  DLCO UNC% % 85  P  DLCO corrected ml/min/mmHg 22.51  P  DLCO COR %Predicted % 85  P  DLVA Predicted % 75  P  TLC L 7.82  P  TLC % Predicted % 113  P  RV % Predicted % 132  P    P Preliminary result      Echocardiogram:   TTE 2020:  1. The left ventricle has normal systolic function with an ejection  fraction of 60-65%. The cavity size was normal. Left ventricular diastolic  parameters were normal. No evidence of left ventricular regional wall   motion abnormalities.   2. The right ventricle has normal systolic function. The cavity was  normal. There is no increase in right ventricular wall thickness. Right  ventricular systolic pressure is normal with an estimated pressure of 26.0  mmHg.   3. Trivial pericardial effusion is present.   4. Mild thickening of the mitral valve leaflet. The MR jet is  anteriorly-directed.   5. There is mild PMVL prolapse. There is mild, anteriorly-directed  regurgitation which occurs in late systole.   6. The aorta is normal unless otherwise noted.   7. The aortic root and ascending aorta are normal in size and structure.   8. There is evidence of plaque in the aortic root.      Assessment & Plan:   # DOE  # Exertional chest pain/tightness Possibilities include deconditioning, angina, exercise induced asthma (though pre/post BD spiro normal), athough has had exposure to medications that can cause pulmonary toxicity, less likely parenchymal lung disease (CT lung bases looks fine and PFTs are normal).  # Chronic cough GERD, PND, lisinopril all potentially contributory but not super bothersome to him.  Plan: - If still having DOE, chest tightness with exertion despite efforts to exercise, lose weight then we can consider methacholine challenge (more sensitive test for asthma), or cardiopulmonary exercise test (like a heart-lung stress test to see which body system is causing you trouble) - if cough super bothersome then would consider alternative blood pressure medication in discussion with your PCP. O - try albuterol 1-2 puffs 5-20 minutes before heavy exertion and every 4-6 hours as needed (at most)      Maryjane Hurter, MD [facility] 08/09/2022 11:50 AM

## 2022-08-09 ENCOUNTER — Ambulatory Visit (INDEPENDENT_AMBULATORY_CARE_PROVIDER_SITE_OTHER): Payer: Medicare HMO | Admitting: Student

## 2022-08-09 ENCOUNTER — Encounter: Payer: Self-pay | Admitting: Student

## 2022-08-09 VITALS — BP 132/78 | HR 99 | Ht 69.0 in | Wt 190.0 lb

## 2022-08-09 DIAGNOSIS — R0609 Other forms of dyspnea: Secondary | ICD-10-CM

## 2022-08-09 DIAGNOSIS — R053 Chronic cough: Secondary | ICD-10-CM | POA: Diagnosis not present

## 2022-08-09 LAB — PULMONARY FUNCTION TEST
DL/VA % pred: 75 %
DL/VA: 3.14 ml/min/mmHg/L
DLCO cor % pred: 85 %
DLCO cor: 22.51 ml/min/mmHg
DLCO unc % pred: 85 %
DLCO unc: 22.51 ml/min/mmHg
FEF 25-75 Post: 4.66 L/sec
FEF 25-75 Pre: 4.66 L/sec
FEF2575-%Change-Post: 0 %
FEF2575-%Pred-Post: 173 %
FEF2575-%Pred-Pre: 173 %
FEV1-%Change-Post: 0 %
FEV1-%Pred-Post: 118 %
FEV1-%Pred-Pre: 117 %
FEV1-Post: 4 L
FEV1-Pre: 3.97 L
FEV1FVC-%Change-Post: 1 %
FEV1FVC-%Pred-Pre: 112 %
FEV6-%Change-Post: 0 %
FEV6-%Pred-Post: 110 %
FEV6-%Pred-Pre: 110 %
FEV6-Post: 4.72 L
FEV6-Pre: 4.73 L
FEV6FVC-%Change-Post: 0 %
FEV6FVC-%Pred-Post: 105 %
FEV6FVC-%Pred-Pre: 105 %
FVC-%Change-Post: 0 %
FVC-%Pred-Post: 104 %
FVC-%Pred-Pre: 104 %
FVC-Post: 4.72 L
FVC-Pre: 4.74 L
Post FEV1/FVC ratio: 85 %
Post FEV6/FVC ratio: 100 %
Pre FEV1/FVC ratio: 84 %
Pre FEV6/FVC Ratio: 100 %
RV % pred: 132 %
RV: 3.07 L
TLC % pred: 113 %
TLC: 7.82 L

## 2022-08-09 NOTE — Progress Notes (Signed)
PFT done today. 

## 2022-08-09 NOTE — Patient Instructions (Signed)
-   If still having DOE, chest tightness with exertion despite efforts to exercise, lose weight then we can consider methacholine challenge (more sensitive test for asthma), or cardiopulmonary exercise test (like a heart-lung stress test to see which body system is causing you trouble) - albuterol 1-2 puffs as needed or 5-20 minutes before exercise

## 2022-08-31 DIAGNOSIS — R079 Chest pain, unspecified: Secondary | ICD-10-CM | POA: Diagnosis not present

## 2022-08-31 DIAGNOSIS — K22710 Barrett's esophagus with low grade dysplasia: Secondary | ICD-10-CM | POA: Diagnosis not present

## 2022-08-31 DIAGNOSIS — R635 Abnormal weight gain: Secondary | ICD-10-CM | POA: Diagnosis not present

## 2022-08-31 DIAGNOSIS — K642 Third degree hemorrhoids: Secondary | ICD-10-CM | POA: Diagnosis not present

## 2022-10-06 DIAGNOSIS — K2289 Other specified disease of esophagus: Secondary | ICD-10-CM | POA: Diagnosis not present

## 2022-10-06 DIAGNOSIS — K635 Polyp of colon: Secondary | ICD-10-CM | POA: Diagnosis not present

## 2022-10-06 DIAGNOSIS — K2270 Barrett's esophagus without dysplasia: Secondary | ICD-10-CM | POA: Diagnosis not present

## 2022-10-06 DIAGNOSIS — K625 Hemorrhage of anus and rectum: Secondary | ICD-10-CM | POA: Diagnosis not present

## 2022-10-06 DIAGNOSIS — K921 Melena: Secondary | ICD-10-CM | POA: Diagnosis not present

## 2022-12-29 ENCOUNTER — Other Ambulatory Visit: Payer: Self-pay | Admitting: Nurse Practitioner

## 2023-01-21 ENCOUNTER — Other Ambulatory Visit: Payer: Self-pay | Admitting: Cardiology

## 2023-01-23 NOTE — Telephone Encounter (Signed)
Please call pt to schedule overdue follow-up appointment with Dr. Mayford Knife for refills. Thank you!

## 2023-02-08 NOTE — Progress Notes (Unsigned)
Cardiology Office Note:    Date:  02/09/2023   ID:  Erik Crosby, DOB November 28, 1955, MRN [id number]  PCP:  Roger Kill, PA-C   [facility] Providers Cardiologist:  Armanda Magic, MD     Referring MD: Roger Kill, *   Chief complaint: shortness of breath  History of Present Illness:    Erik Crosby is a 67 y.o. male with a hx of paroxysmal SVT, hypertension, CAD, elevated coronary calcium score, and hyperlipidemia.   He had a coronary CTA 08/2019 that showed ASCAD with coronary calcium score of 120 and mid CAD of the LAD with 25-49% proximal and mid stenosis and coronary FFR with no hemodynamically flow limiting lesions. On 09/01/20 he was transported to [facility] ED after calling EMS for chest pain over the previous 2 days, worse with exertion. He was found to be in SVT with heart rates up to 190s.  He was given adenosine IV 6 mg x 1 with improvement in heart rate to low 100s.  His symptoms of chest discomfort subsequently improved after adenosine. EKG revealed no concerns for ischemia. TSH was normal.  Potassium was low at 3.2. 2nd hs troponin was mildly elevated at 62 and no further testing was recommended by Dr. Azucena Cecil. He was advised to follow-up as OP.   Seen in cardiology clinic on 10/06/20 by Dr. Mayford Knife at which time he reported no further episodes of SVT.  He was walking 14,000 steps daily, exercising and unloading trucks at work with no symptoms of chest pain, palpitations, dyspnea.  Lexiscan Myoview was ordered to rule out ischemia and was a low risk study.  He had very elevated triglycerides at 574, LDL 181.  He had stopped his statin for a while but was advised to restart atorvastatin 80 mg. There is no record of repeat lipid panel.   Seen by me on 12/24/21 for follow-up. Reported no further episodes of SVT. Has a feeling of tightness in his upper chest like he is unable to get a deep breath most noticeably when he is climbing stairs. It is not always  present with exertion. Stated, "some days I feel like I could run a marathon and other days I struggle to make it up the stairs." Admits he rarely drinks water. Has noted higher diastolic readings than what we got today (86 mmHg). Has retired so he has not been as active over the past year. No lower extremity edema, fatigue, palpitations, melena, hematuria, hemoptysis, diaphoresis, weakness, presyncope, syncope, orthopnea, and PND. Triglycerides previously as high as 574. Reported he had stopped statin at the time. Recheck on day of office visit revealed triglycerides and LDL cholesterol well controlled. He was encouraged to increase hydration and notify us if symptoms of occasional dyspnea persisted or worsened.  Today, he is here for one year follow-up. He reports he has been having increased episodes of chest pain and shortness of breath with exertion. Notes that his HR is up to 130s at times. Resting HR generally 90s-100s. O2 sat 97% with symptoms of shortness of breath. Is retired, has part time job at [employer] which is very active with climbing ladders and walking all over the store.  He is also active at home. We discussed chest pain and dyspnea at last office visit 1 year ago. He has worked on better hydration and does feel better if he stays hydrated, but overall feels like symptoms are more frequent and now HR is elevated more frequently than in the past.  Saw pulmonologist 08/2022, no concerns. Was given an inhaler. Also gets lightheaded with sudden movement.  He denies edema, orthopnea, PND, palpitations, presyncope, syncope.  Past Medical History:  Diagnosis Date   AK (actinic keratosis)    Barrett's esophagus without dysplasia    Bone pain 06/23/2014   CAD (coronary artery disease), native coronary artery    coronary CTA 2021 with 25-49% proximal and mid LAD stenosis and coronary Ca score of 120   Cancer (HCC)    Constipation 07/11/2014   Follicular lymphoma grade 3a (HCC) 06/12/2014   GERD  (gastroesophageal reflux disease)    occ-takes vinagar   Headache 07/11/2014   Hypertension    Insomnia 06/18/2014   Other fatigue 03/04/2015   Spinal headache 07/11/2014   SVT (supraventricular tachycardia) 09/2020   Wears glasses     Past Surgical History:  Procedure Laterality Date   APPENDECTOMY     COLONOSCOPY     LYMPH NODE BIOPSY Right 06/03/2014   Procedure: RIGHT SUPRACLAVICULAR LYMPH NODE BIOPSY;  Surgeon: Adolph Pollack, MD;  Location: [facility];  Service: General;  Laterality: Right;   PORTACATH PLACEMENT N/A 06/17/2014   Procedure: ULTRASOUND GUIDED PORT-A-CATH INSERTION;  Surgeon: Avel Peace, MD;  Location: [facility] OR;  Service: General;  Laterality: N/A;    Current Medications: Current Meds  Medication Sig   albuterol (VENTOLIN HFA) 108 (90 Base) MCG/ACT inhaler Inhale 2 puffs into the lungs every 6 (six) hours as needed for wheezing or shortness of breath.   atorvastatin (LIPITOR) 80 MG tablet TAKE 1 TABLET BY MOUTH EVERY DAY   cholecalciferol (VITAMIN D) 1000 UNITS tablet Take 2,000 Units by mouth daily.    ezetimibe (ZETIA) 10 MG tablet TAKE 1 TABLET BY MOUTH EVERY DAY   ibuprofen (ADVIL) 200 MG tablet Take 1 tablet (200 mg total) by mouth every 6 (six) hours as needed.   isosorbide mononitrate (IMDUR) 30 MG 24 hr tablet Take 1 tablet (30 mg total) by mouth daily.   lisinopril (ZESTRIL) 20 MG tablet TAKE 1 TABLET BY MOUTH EVERY DAY   melatonin 5 MG TABS Take 2 tablets (10 mg total) by mouth at bedtime as needed.   metoprolol tartrate (LOPRESSOR) 25 MG tablet Take 1 tablet (25 mg total) by mouth 2 (two) times daily.   nitroGLYCERIN (NITROSTAT) 0.4 MG SL tablet Place 1 tablet (0.4 mg total) under the tongue every 5 (five) minutes as needed for chest pain (for maximum of 3 pills for one episode).   omeprazole (PRILOSEC) 20 MG capsule Take 2 capsules (40 mg total) by mouth 2 (two) times daily before a meal.     Allergies:   Patient has no known allergies.    Social History   Socioeconomic History   Marital status: Married    Spouse name: Not on file   Number of children: Not on file   Years of education: Not on file   Highest education level: Not on file  Occupational History   Not on file  Tobacco Use   Smoking status: Never   Smokeless tobacco: Never  Vaping Use   Vaping Use: Never used  Substance and Sexual Activity   Alcohol use: Yes    Alcohol/week: 21.0 standard drinks of alcohol    Types: 21 Shots of liquor per week   Drug use: Yes    Types: Marijuana    Comment: Pt states that he eats marijuana cookies    Sexual activity: Not on file  Other Topics Concern  Not on file  Social History Narrative   Not on file   Social Determinants of Health   Financial Resource Strain: Not on file  Food Insecurity: Not on file  Transportation Needs: Not on file  Physical Activity: Not on file  Stress: Not on file  Social Connections: Not on file     Family History: The patient's family history includes Cancer in his mother; Colon cancer in an other family member; Diabetes in an other family member; Heart disease in his father; Hypertension in an other family member.  ROS:   Please see the history of present illness.    + chest discomfort + tachycardia + shortness of breath All other systems reviewed and are negative.  Labs/Other Studies Reviewed:    The following studies were reviewed today:  Lexiscan myoview 10/22/20  The left ventricular ejection fraction is normal (55-65%). Nuclear stress EF: 65%. There was no ST segment deviation noted during stress. This is a low risk study.    Coronary CT 08/2019  1. Coronary calcium score of 120. This was 66th percentile for age and sex matched control.   2. Normal coronary origin with right dominance.   3. There is mild atherosclerosis of the proximal and mid LAD. CAD-RADS 2.   4. Consider non cardiac causes of chest pain.   5. This study has been sent for [facility]  analysis due to inability to adequately visualize the distal LAD. Aorta:  Normal size.  Aortic atherosclerosis.  No dissection.   Aortic Valve:  Trileaflet.  No calcifications. Coronary Arteries:  Normal coronary origin.  Right dominance. RCA is a large dominant artery that gives rise to PDA and PLVB. There is minimal calcified plaque in the proximal to mid RCA with associated stenosis of 0-24%. Left main is a large artery that gives rise to LAD and LCX arteries. There is no plaque. LAD is a large vessel that gives rise to a large 1st diagonal and a second large branding 2nd diagonal. There is mild calcified plaque in the proximal LAD at the takeoff of D1 with associated stenosis of 25-49%. There is mild calcified plaque in the mid LAD with associated stenosis of 25-49%. The distal LAD is poorly visualized. LCX is a non-dominant artery that gives rise to one large OM1 branch. There is no plaque.   Other findings:   Normal pulmonary vein drainage into the left atrium. Normal left atrial appendage without a thrombus. Normal size of the pulmonary artery.  IMPRESSION: 1. Coronary calcium score of 120. This was 66th percentile for age and sex matched control.   2. Normal coronary origin with right dominance.   3. There is mild atherosclerosis of the proximal and mid LAD. CAD-RADS 2.  Echo 05/2019  Left Ventricle: The left ventricle has normal systolic function, with an  ejection fraction of 60-65%. The cavity size was normal. There is no  increase in left ventricular wall thickness. Left ventricular diastolic  parameters were normal. Normal left  ventricular filling pressures No evidence of left ventricular regional  wall motion abnormalities..  Right Ventricle: The right ventricle has normal systolic function. The  cavity was normal. There is no increase in right ventricular wall  thickness. Right ventricular systolic pressure is normal with an estimated  pressure of 26.0  mmHg.  Left Atrium: Left atrial size was normal in size.  Right Atrium: Right atrial size was normal in size. Right atrial pressure  is estimated at 10 mmHg.  Interatrial Septum: No atrial level  shunt detected by color flow Doppler.  Pericardium: Trivial pericardial effusion is present.  Mitral Valve: The mitral valve is normal in structure. Mild thickening of  the mitral valve leaflet. Mitral valve regurgitation is mild by color flow  Doppler. The MR jet is anteriorly-directed. There is mild PMVL prolapse.  There is mild,  anteriorly-directed regurgitation which occurs in late systole.  Tricuspid Valve: The tricuspid valve is normal in structure. Tricuspid  valve regurgitation is trivial by color flow Doppler.  Aortic Valve: The aortic valve is tricuspid. Mild thickening of the aortic  valve.  Pulmonic Valve: The pulmonic valve was grossly normal. Pulmonic valve  regurgitation is not visualized by color flow Doppler.  Aorta: The aortic root and ascending aorta are normal in size and  structure. The aorta is normal unless otherwise noted. There is evidence  of plaque in the aortic root.  Venous: The inferior vena cava is normal in size with greater than 50%  respiratory variability.   Recent Lipid Panel    Component Value Date/Time   CHOL 122 12/24/2021 0938   TRIG 48 12/24/2021 0938   HDL 54 12/24/2021 0938   CHOLHDL 2.3 12/24/2021 0938   CHOLHDL 6.2 09/01/2020 1345   VLDL UNABLE TO CALCULATE IF TRIGLYCERIDE OVER 400 mg/dL 72/53/6644 0347   LDLCALC 57 12/24/2021 0938   LDLDIRECT 181.3 (H) 09/01/2020 1345     Risk Assessment/Calculations:       Physical Exam:    VS:  BP (!) 140/80   Pulse 94   Ht 5\' 9"  (1.753 m)   Wt 186 lb (84.4 kg)   SpO2 98%   BMI 27.47 kg/m     Wt Readings from Last 3 Encounters:  02/09/23 186 lb (84.4 kg)  08/09/22 190 lb (86.2 kg)  06/08/22 190 lb (86.2 kg)     GEN:  Well nourished, well developed in no acute distress HEENT:  Normal NECK: No JVD; No carotid bruits CARDIAC: RRR, no murmurs, rubs, gallops RESPIRATORY:  Clear to auscultation without rales, wheezing or rhonchi  ABDOMEN: Soft, non-tender, non-distended MUSCULOSKELETAL:  No edema; No deformity. 2+ pedal pulses, equal bilaterally SKIN: Warm and dry NEUROLOGIC:  Alert and oriented x 3 PSYCHIATRIC:  Normal affect   EKG:  EKG is ordered today and reveals normal sinus rhythm at 94 bpm, no ST abnormality  Diagnoses:    1. DOE (dyspnea on exertion)   2. Hyperlipidemia LDL goal <70   3. SVT (supraventricular tachycardia)   4. Coronary artery disease involving native coronary artery of native heart with unstable angina pectoris (HCC)   5. Essential hypertension     Assessment and Plan:     CAD with unstable angina: He is having symptoms of shortness of breath associated with tachycardia and chest discomfort with exertion. History of nonobstructive coronary artery disease with elevated calcium score of 120 on coronary CTA 08/2019. Normal stress test 10/2020. Is active working part-time at [employer] and around his house and yard. He was having symptoms when I saw him last year, but HR is more elevated with exertion than in the past.  He has improved his hydration and symptoms are improved with good hydration but overall he feels more symptomatic than last year.  We will add metoprolol and Imdur for GDMT of angina and see him back in soon follow-up. Additionally, I am giving him sublingual nitroglycerin to take as needed. Instructed him on appropriate use of SL NTG. Advised he start metoprolol then wait 5 days and start  Imudr.  Consider repeat coronary CT or catheterization if symptoms persist on medical therapy. Will update lipid panel, CMET, and CBC today.   Dyspnea on exertion: Increased DOE associated with chest pressure and lightheadedness as noted above. Normal LVEF on Myoview 10/2020.  No evidence of volume overload on exam. He denies edema, orthopnea, PND.  Admits there may be component of deconditioning as he is less active than when working full-time and thinks he has gained weight (weight actually down 4 lbs from 08/2022). As noted above, we are initiating medical therapy with metoprolol and Imdur and will see him back in 1 month.  Consider additional testing or cardiac catheterization if symptoms warrant.  Hyperlipidemia LDL goal < 70/Hypertriglyceridemia: LDL 57, trigs 48 on 12/24/21. Previously had triglycerides > 500 (had stopped statin). We will recheck today. Continue atorvastatin, ezetimibe.   Essential hypertension: BP mildly elevated today. We are adding metoprolol and Imdur for management of CAD which will likely lower BP to goal.    SVT: No symptoms of SVT. As noted above, with symptoms of DOE advised placement of cardiac monitor in the future if DOE persists.    Disposition: 1 month with me     Medication Adjustments/Labs and Tests Ordered: Current medicines are reviewed at length with the patient today.  Concerns regarding medicines are outlined above.  Orders Placed This Encounter  Procedures   Lipid Profile   Comp Met (CMET)   CBC   EKG 12-Lead   Meds ordered this encounter  Medications   metoprolol tartrate (LOPRESSOR) 25 MG tablet    Sig: Take 1 tablet (25 mg total) by mouth 2 (two) times daily.    Dispense:  180 tablet    Refill:  3   isosorbide mononitrate (IMDUR) 30 MG 24 hr tablet    Sig: Take 1 tablet (30 mg total) by mouth daily.    Dispense:  90 tablet    Refill:  3   nitroGLYCERIN (NITROSTAT) 0.4 MG SL tablet    Sig: Place 1 tablet (0.4 mg total) under the tongue every 5 (five) minutes as needed for chest pain (for maximum of 3 pills for one episode).    Dispense:  25 tablet    Refill:  3    Order Specific Question:   Supervising Provider    Answer:   Vesta Mixer [phone]    Patient Instructions  Medication Instructions:   START Lopressor one (1) tablet by mouth ( 25 mg) twice daily.   START  Imdur in 5 days one (1) tablet by mouth ( 30 mg ) daily.   *If you need a refill on your cardiac medications before your next appointment, please call your pharmacy*   Lab Work:  TODAY!!!!! LIPID/CMET/CBC  If you have labs (blood work) drawn today and your tests are completely normal, you will receive your results only by: MyChart Message (if you have MyChart) OR A paper copy in the mail If you have any lab test that is abnormal or we need to change your treatment, we will call you to review the results.   Testing/Procedures:  None ordered.   Follow-Up: At [facility], you and your health needs are our priority.  As part of our continuing mission to provide you with exceptional heart care, we have created designated Provider Care Teams.  These Care Teams include your primary Cardiologist (physician) and Advanced Practice Providers (APPs -  Physician Assistants and Nurse Practitioners) who all work together to provide you with the  care you need, when you need it.  We recommend signing up for the patient portal called "MyChart".  Sign up information is provided on this After Visit Summary.  MyChart is used to connect with patients for Virtual Visits (Telemedicine).  Patients are able to view lab/test results, encounter notes, upcoming appointments, etc.  Non-urgent messages can be sent to your provider as well.   To learn more about what you can do with MyChart, go to [email or web address].    Your next appointment:   1 month(s)  Provider:   Eligha Bridegroom, NP           Signed, Levi Aland, NP  02/09/2023 12:34 PM    [facility]

## 2023-02-09 ENCOUNTER — Ambulatory Visit: Payer: Medicare HMO | Attending: Nurse Practitioner | Admitting: Nurse Practitioner

## 2023-02-09 ENCOUNTER — Encounter: Payer: Self-pay | Admitting: Nurse Practitioner

## 2023-02-09 VITALS — BP 140/80 | HR 94 | Ht 69.0 in | Wt 186.0 lb

## 2023-02-09 DIAGNOSIS — R0609 Other forms of dyspnea: Secondary | ICD-10-CM

## 2023-02-09 DIAGNOSIS — I471 Supraventricular tachycardia, unspecified: Secondary | ICD-10-CM | POA: Diagnosis not present

## 2023-02-09 DIAGNOSIS — E785 Hyperlipidemia, unspecified: Secondary | ICD-10-CM | POA: Diagnosis not present

## 2023-02-09 DIAGNOSIS — I10 Essential (primary) hypertension: Secondary | ICD-10-CM

## 2023-02-09 DIAGNOSIS — I2510 Atherosclerotic heart disease of native coronary artery without angina pectoris: Secondary | ICD-10-CM | POA: Diagnosis not present

## 2023-02-09 DIAGNOSIS — I2583 Coronary atherosclerosis due to lipid rich plaque: Secondary | ICD-10-CM | POA: Diagnosis not present

## 2023-02-09 DIAGNOSIS — I25110 Atherosclerotic heart disease of native coronary artery with unstable angina pectoris: Secondary | ICD-10-CM | POA: Diagnosis not present

## 2023-02-09 LAB — LIPID PANEL
Cholesterol, Total: 233 mg/dL — ABNORMAL HIGH (ref 100–199)
Triglycerides: 110 mg/dL (ref 0–149)

## 2023-02-09 LAB — COMPREHENSIVE METABOLIC PANEL
AST: 19 IU/L (ref 0–40)
Albumin: 4.5 g/dL (ref 3.9–4.9)
Alkaline Phosphatase: 54 IU/L (ref 44–121)

## 2023-02-09 LAB — CBC

## 2023-02-09 MED ORDER — ISOSORBIDE MONONITRATE ER 30 MG PO TB24: 30.0000 mg | ORAL_TABLET | Freq: Every day | ORAL | 3 refills | Status: DC

## 2023-02-09 MED ORDER — NITROGLYCERIN 0.4 MG SL SUBL: 0.4000 mg | SUBLINGUAL_TABLET | SUBLINGUAL | 3 refills | Status: AC | PRN

## 2023-02-09 MED ORDER — METOPROLOL TARTRATE 25 MG PO TABS: 25.0000 mg | ORAL_TABLET | Freq: Two times a day (BID) | ORAL | 3 refills | Status: DC

## 2023-02-09 NOTE — Patient Instructions (Signed)
Medication Instructions:   START Lopressor one (1) tablet by mouth ( 25 mg) twice daily.   START Imdur in 5 days one (1) tablet by mouth ( 30 mg ) daily.   *If you need a refill on your cardiac medications before your next appointment, please call your pharmacy*   Lab Work:  TODAY!!!!! LIPID/CMET/CBC  If you have labs (blood work) drawn today and your tests are completely normal, you will receive your results only by: MyChart Message (if you have MyChart) OR A paper copy in the mail If you have any lab test that is abnormal or we need to change your treatment, we will call you to review the results.   Testing/Procedures:  None ordered.   Follow-Up: At [facility], you and your health needs are our priority.  As part of our continuing mission to provide you with exceptional heart care, we have created designated Provider Care Teams.  These Care Teams include your primary Cardiologist (physician) and Advanced Practice Providers (APPs -  Physician Assistants and Nurse Practitioners) who all work together to provide you with the care you need, when you need it.  We recommend signing up for the patient portal called "MyChart".  Sign up information is provided on this After Visit Summary.  MyChart is used to connect with patients for Virtual Visits (Telemedicine).  Patients are able to view lab/test results, encounter notes, upcoming appointments, etc.  Non-urgent messages can be sent to your provider as well.   To learn more about what you can do with MyChart, go to [email or web address].    Your next appointment:   1 month(s)  Provider:   Eligha Bridegroom, NP

## 2023-02-10 LAB — COMPREHENSIVE METABOLIC PANEL
ALT: 14 IU/L (ref 0–44)
Albumin/Globulin Ratio: 1.8 (ref 1.2–2.2)
BUN/Creatinine Ratio: 7 — ABNORMAL LOW (ref 10–24)
BUN: 8 mg/dL (ref 8–27)
Bilirubin Total: 0.2 mg/dL (ref 0.0–1.2)
CO2: 24 mmol/L (ref 20–29)
Calcium: 9.2 mg/dL (ref 8.6–10.2)
Chloride: 96 mmol/L (ref 96–106)
Creatinine, Ser: 1.07 mg/dL (ref 0.76–1.27)
Globulin, Total: 2.5 g/dL (ref 1.5–4.5)
Glucose: 120 mg/dL — ABNORMAL HIGH (ref 70–99)
Potassium: 4.8 mmol/L (ref 3.5–5.2)
Sodium: 133 mmol/L — ABNORMAL LOW (ref 134–144)
Total Protein: 7 g/dL (ref 6.0–8.5)
eGFR: 77 mL/min/{1.73_m2} (ref >59)

## 2023-02-10 LAB — CBC
Hematocrit: 36.7 % — ABNORMAL LOW (ref 37.5–51.0)
Hemoglobin: 10.6 g/dL — ABNORMAL LOW (ref 13.0–17.7)
MCH: 21.3 pg — ABNORMAL LOW (ref 26.6–33.0)
MCV: 74 fL — ABNORMAL LOW (ref 79–97)
Platelets: 333 10*3/uL (ref 150–450)
RBC: 4.97 x10E6/uL (ref 4.14–5.80)
RDW: 17.3 % — ABNORMAL HIGH (ref 11.6–15.4)
WBC: 4.1 10*3/uL (ref 3.4–10.8)

## 2023-02-10 LAB — LIPID PANEL
Chol/HDL Ratio: 5.5 ratio — ABNORMAL HIGH (ref 0.0–5.0)
HDL: 42 mg/dL (ref >39)
LDL Chol Calc (NIH): 171 mg/dL — ABNORMAL HIGH (ref 0–99)
VLDL Cholesterol Cal: 20 mg/dL (ref 5–40)

## 2023-03-08 ENCOUNTER — Ambulatory Visit: Payer: Medicare HMO | Attending: Nurse Practitioner | Admitting: Nurse Practitioner

## 2023-03-08 ENCOUNTER — Encounter: Payer: Self-pay | Admitting: Nurse Practitioner

## 2023-03-08 VITALS — BP 132/80 | HR 93 | Ht 70.0 in | Wt 189.2 lb

## 2023-03-08 DIAGNOSIS — I471 Supraventricular tachycardia, unspecified: Secondary | ICD-10-CM | POA: Diagnosis not present

## 2023-03-08 DIAGNOSIS — I25118 Atherosclerotic heart disease of native coronary artery with other forms of angina pectoris: Secondary | ICD-10-CM | POA: Diagnosis not present

## 2023-03-08 DIAGNOSIS — I10 Essential (primary) hypertension: Secondary | ICD-10-CM | POA: Diagnosis not present

## 2023-03-08 DIAGNOSIS — E785 Hyperlipidemia, unspecified: Secondary | ICD-10-CM | POA: Diagnosis not present

## 2023-03-08 DIAGNOSIS — R0609 Other forms of dyspnea: Secondary | ICD-10-CM | POA: Diagnosis not present

## 2023-03-08 MED ORDER — METOPROLOL TARTRATE 50 MG PO TABS: 50.0000 mg | ORAL_TABLET | Freq: Two times a day (BID) | ORAL | 3 refills | Status: DC

## 2023-03-08 NOTE — Patient Instructions (Addendum)
Medication Instructions:   INCREASE Lopressor one (1) tablet by mouth ( 50 mg) twice daily.  You can take two (2) of you ( 25 mg) tablets at the same time twice day to use them up.   *If you need a refill on your cardiac medications before your next appointment, please call your pharmacy*   Lab Work:  Your physician recommends that you return for a FASTING lipid profile/cmet on Wednesday, July 17. You can come in on the day of your appointment anytime between 7:30-4:30 fasting from midnight the night before.    If you have labs (blood work) drawn today and your tests are completely normal, you will receive your results only by: MyChart Message (if you have MyChart) OR A paper copy in the mail If you have any lab test that is abnormal or we need to change your treatment, we will call you to review the results.   Testing/Procedures:  None ordered.   Follow-Up: At [facility], you and your health needs are our priority.  As part of our continuing mission to provide you with exceptional heart care, we have created designated Provider Care Teams.  These Care Teams include your primary Cardiologist (physician) and Advanced Practice Providers (APPs -  Physician Assistants and Nurse Practitioners) who all work together to provide you with the care you need, when you need it.  We recommend signing up for the patient portal called "MyChart".  Sign up information is provided on this After Visit Summary.  MyChart is used to connect with patients for Virtual Visits (Telemedicine).  Patients are able to view lab/test results, encounter notes, upcoming appointments, etc.  Non-urgent messages can be sent to your provider as well.   To learn more about what you can do with MyChart, go to [email or web address].    Your next appointment:   4 month(s)  Provider:   Armanda Magic, MD

## 2023-03-08 NOTE — Progress Notes (Signed)
Cardiology Office Note:    Date:  03/08/2023   ID:  Erik Crosby, DOB 07-Jan-1956, MRN [id number]  PCP:  Roger Kill, PA-C   [facility] Providers Cardiologist:  Armanda Magic, MD     Referring MD: Roger Kill, *   Chief complaint: angina  History of Present Illness:    Erik Crosby is a 67 y.o. male with a hx of paroxysmal SVT, hypertension, CAD, elevated coronary calcium score, and hyperlipidemia.   He had a coronary CTA 08/2019 that showed ASCAD with coronary calcium score of 120 and mid CAD of the LAD with 25-49% proximal and mid stenosis and coronary FFR with no hemodynamically flow limiting lesions. On 09/01/20 he was transported to [facility] ED after calling EMS for chest pain over the previous 2 days, worse with exertion. He was found to be in SVT with heart rates up to 190s.  He was given adenosine IV 6 mg x 1 with improvement in heart rate to low 100s.  His symptoms of chest discomfort subsequently improved after adenosine. EKG revealed no concerns for ischemia. TSH was normal.  Potassium was low at 3.2. 2nd hs troponin was mildly elevated at 62 and no further testing was recommended by Dr. Azucena Cecil. He was advised to follow-up as OP.   Seen in clinic 10/06/20 by Dr. Mayford Knife at which time he reported no further episodes of SVT. Walking 14,000 steps daily, exercising and unloading trucks at work with no symptoms of chest pain, palpitations, dyspnea. Lexiscan Myoview was ordered to rule out ischemia and was a low risk study.  He had very elevated triglycerides at 574, LDL 181. Had stopped his statin for a while but was advised to restart atorvastatin 80 mg. There is no record of repeat lipid panel.   Seen by  me on 12/24/21 for follow-up. Reported no further episodes of SVT. Reported symptoms concerning for angina most notably when climbing stairs. Reports "some days I feel like I could run a marathon and other days I struggle to make it up the stairs."  Rarely drinking water. Not as active since retirement. He was to notify us if symptoms persisted or worsened despite improved hydration.  Seen by me on 02/09/23 for annual follow-up. Continued to have episodes of chest pain and shortness of breath with exertion with elevated HR up to 130s. Working at [employer] part-time and is very active.  Symptoms improved somewhat with better hydration but persisted.  Was seen by pulmonology with no obvious cause of shortness of breath.  Symptoms concerning for worsening angina.  Discussed with Dr. Excell Seltzer, DOD and we started GDMT including Imdur 30 mg and metoprolol 25 mg BID, as well as sl ntg to use as needed. One month follow-up was recommended.  Today, he is here for follow-up and reports he is feeling improvement in symptoms.  He has tolerated metoprolol and Imdur without concerning side effects.  Chest pain greatly reduced but does note that approximately 5 or 6 hours after taking metoprolol, he starts to feel chest pain and return and his HR increases. Self increased metoprolol to take an additional pill twice daily and this has improved symptoms. No lightheadedness, presyncope, syncope, palpitations, orthopnea, or PND. Has not been monitoring blood pressure but prior to initiation of GDMT, he noted headache associated with elevated BP and he has not felt that since. Has been more consistent with taking his statin daily. Improving diet, eating less processed and fast food. Feels that previous symptom described  as DOE was his heart rate speeding up and associated chest pain.    Past Medical History:  Diagnosis Date   AK (actinic keratosis)    Barrett's esophagus without dysplasia    Bone pain 06/23/2014   CAD (coronary artery disease), native coronary artery    coronary CTA 2021 with 25-49% proximal and mid LAD stenosis and coronary Ca score of 120   Cancer (HCC)    Constipation 07/11/2014   Follicular lymphoma grade 3a (HCC) 06/12/2014   GERD (gastroesophageal  reflux disease)    occ-takes vinagar   Headache 07/11/2014   Hypertension    Insomnia 06/18/2014   Other fatigue 03/04/2015   Spinal headache 07/11/2014   SVT (supraventricular tachycardia) 09/2020   Wears glasses     Past Surgical History:  Procedure Laterality Date   APPENDECTOMY     COLONOSCOPY     LYMPH NODE BIOPSY Right 06/03/2014   Procedure: RIGHT SUPRACLAVICULAR LYMPH NODE BIOPSY;  Surgeon: Adolph Pollack, MD;  Location: [facility];  Service: General;  Laterality: Right;   PORTACATH PLACEMENT N/A 06/17/2014   Procedure: ULTRASOUND GUIDED PORT-A-CATH INSERTION;  Surgeon: Avel Peace, MD;  Location: [facility];  Service: General;  Laterality: N/A;    Current Medications: Current Meds  Medication Sig   albuterol (VENTOLIN HFA) 108 (90 Base) MCG/ACT inhaler Inhale 2 puffs into the lungs every 6 (six) hours as needed for wheezing or shortness of breath.   atorvastatin (LIPITOR) 80 MG tablet TAKE 1 TABLET BY MOUTH EVERY DAY   cholecalciferol (VITAMIN D) 1000 UNITS tablet Take 2,000 Units by mouth daily.    ezetimibe (ZETIA) 10 MG tablet TAKE 1 TABLET BY MOUTH EVERY DAY   ibuprofen (ADVIL) 200 MG tablet Take 1 tablet (200 mg total) by mouth every 6 (six) hours as needed.   isosorbide mononitrate (IMDUR) 30 MG 24 hr tablet Take 1 tablet (30 mg total) by mouth daily.   lisinopril (ZESTRIL) 20 MG tablet TAKE 1 TABLET BY MOUTH EVERY DAY   melatonin 5 MG TABS Take 2 tablets (10 mg total) by mouth at bedtime as needed.   nitroGLYCERIN (NITROSTAT) 0.4 MG SL tablet Place 1 tablet (0.4 mg total) under the tongue every 5 (five) minutes as needed for chest pain (for maximum of 3 pills for one episode).   omeprazole (PRILOSEC) 20 MG capsule Take 2 capsules (40 mg total) by mouth 2 (two) times daily before a meal.   [DISCONTINUED] metoprolol tartrate (LOPRESSOR) 25 MG tablet Take 1 tablet (25 mg total) by mouth 2 (two) times daily.     Allergies:   Patient has no known allergies.    Social History   Socioeconomic History   Marital status: Married    Spouse name: Not on file   Number of children: Not on file   Years of education: Not on file   Highest education level: Not on file  Occupational History   Not on file  Tobacco Use   Smoking status: Never   Smokeless tobacco: Never  Vaping Use   Vaping Use: Never used  Substance and Sexual Activity   Alcohol use: Yes    Alcohol/week: 21.0 standard drinks of alcohol    Types: 21 Shots of liquor per week   Drug use: Yes    Types: Marijuana    Comment: Pt states that he eats marijuana cookies    Sexual activity: Not on file  Other Topics Concern   Not on file  Social History Narrative  Not on file   Social Determinants of Health   Financial Resource Strain: Not on file  Food Insecurity: Not on file  Transportation Needs: Not on file  Physical Activity: Not on file  Stress: Not on file  Social Connections: Not on file     Family History: The patient's family history includes Cancer in his mother; Colon cancer in an other family member; Diabetes in an other family member; Heart disease in his father; Hypertension in an other family member.  ROS:   Please see the history of present illness.   All other systems reviewed and are negative.  Labs/Other Studies Reviewed:    The following studies were reviewed today:  Lexiscan myoview 10/22/20  The left ventricular ejection fraction is normal (55-65%). Nuclear stress EF: 65%. There was no ST segment deviation noted during stress. This is a low risk study.    Coronary CT 08/2019  1. Coronary calcium score of 120. This was 66th percentile for age and sex matched control.   2. Normal coronary origin with right dominance.   3. There is mild atherosclerosis of the proximal and mid LAD. CAD-RADS 2.   4. Consider non cardiac causes of chest pain.   5. This study has been sent for [facility] analysis due to inability to adequately visualize the distal  LAD. Aorta:  Normal size.  Aortic atherosclerosis.  No dissection.   Aortic Valve:  Trileaflet.  No calcifications. Coronary Arteries:  Normal coronary origin.  Right dominance. RCA is a large dominant artery that gives rise to PDA and PLVB. There is minimal calcified plaque in the proximal to mid RCA with associated stenosis of 0-24%. Left main is a large artery that gives rise to LAD and LCX arteries. There is no plaque. LAD is a large vessel that gives rise to a large 1st diagonal and a second large branding 2nd diagonal. There is mild calcified plaque in the proximal LAD at the takeoff of D1 with associated stenosis of 25-49%. There is mild calcified plaque in the mid LAD with associated stenosis of 25-49%. The distal LAD is poorly visualized. LCX is a non-dominant artery that gives rise to one large OM1 branch. There is no plaque.   Other findings:   Normal pulmonary vein drainage into the left atrium. Normal left atrial appendage without a thrombus. Normal size of the pulmonary artery.  IMPRESSION: 1. Coronary calcium score of 120. This was 66th percentile for age and sex matched control.   2. Normal coronary origin with right dominance.   3. There is mild atherosclerosis of the proximal and mid LAD. CAD-RADS 2.  Echo 05/2019  Left Ventricle: The left ventricle has normal systolic function, with an  ejection fraction of 60-65%. The cavity size was normal. There is no  increase in left ventricular wall thickness. Left ventricular diastolic  parameters were normal. Normal left  ventricular filling pressures No evidence of left ventricular regional  wall motion abnormalities..  Right Ventricle: The right ventricle has normal systolic function. The  cavity was normal. There is no increase in right ventricular wall  thickness. Right ventricular systolic pressure is normal with an estimated  pressure of 26.0 mmHg.  Left Atrium: Left atrial size was normal in size.  Right  Atrium: Right atrial size was normal in size. Right atrial pressure  is estimated at 10 mmHg.  Interatrial Septum: No atrial level shunt detected by color flow Doppler.  Pericardium: Trivial pericardial effusion is present.  Mitral Valve: The mitral valve  is normal in structure. Mild thickening of  the mitral valve leaflet. Mitral valve regurgitation is mild by color flow  Doppler. The MR jet is anteriorly-directed. There is mild PMVL prolapse.  There is mild,  anteriorly-directed regurgitation which occurs in late systole.  Tricuspid Valve: The tricuspid valve is normal in structure. Tricuspid  valve regurgitation is trivial by color flow Doppler.  Aortic Valve: The aortic valve is tricuspid. Mild thickening of the aortic  valve.  Pulmonic Valve: The pulmonic valve was grossly normal. Pulmonic valve  regurgitation is not visualized by color flow Doppler.  Aorta: The aortic root and ascending aorta are normal in size and  structure. The aorta is normal unless otherwise noted. There is evidence  of plaque in the aortic root.  Venous: The inferior vena cava is normal in size with greater than 50%  respiratory variability.   Recent Lipid Panel    Component Value Date/Time   CHOL 233 (H) 02/09/2023 0945   TRIG 110 02/09/2023 0945   HDL 42 02/09/2023 0945   CHOLHDL 5.5 (H) 02/09/2023 0945   CHOLHDL 6.2 09/01/2020 1345   VLDL UNABLE TO CALCULATE IF TRIGLYCERIDE OVER 400 mg/dL 16/07/9603 5409   LDLCALC 171 (H) 02/09/2023 0945   LDLDIRECT 181.3 (H) 09/01/2020 1345     Risk Assessment/Calculations:       Physical Exam:    VS:  BP 132/80   Pulse 93   Ht 5\' 10"  (1.778 m)   Wt 189 lb 3.2 oz (85.8 kg)   SpO2 97%   BMI 27.15 kg/m     Wt Readings from Last 3 Encounters:  03/08/23 189 lb 3.2 oz (85.8 kg)  02/09/23 186 lb (84.4 kg)  08/09/22 190 lb (86.2 kg)     GEN:  Well nourished, well developed in no acute distress HEENT: Normal NECK: No JVD; No carotid bruits CARDIAC:  RRR, no murmurs, rubs, gallops RESPIRATORY:  Clear to auscultation without rales, wheezing or rhonchi  ABDOMEN: Soft, non-tender, non-distended MUSCULOSKELETAL:  No edema; No deformity. 2+ pedal pulses, equal bilaterally SKIN: Warm and dry NEUROLOGIC:  Alert and oriented x 3 PSYCHIATRIC:  Normal affect   EKG:  EKG is not ordered today  Diagnoses:    1. DOE (dyspnea on exertion)   2. Hyperlipidemia LDL goal <70   3. SVT (supraventricular tachycardia)   4. Coronary artery disease of native artery of native heart with stable angina pectoris (HCC)   5. Essential hypertension     Assessment and Plan:     CAD with angina: Mild nonobstructive coronary disease with an elevated calcium score of 120 08/2019. Normal stress test  10/2020. Having symptoms concerning for angina at office visit 02/09/23. Was initiated on GDMT including Lopressor and Imdur. He increased Lopressor to see if symptoms would resolve which they did. No indication for further ischemic evaluation at this time. Will see him back for close follow-up in 3-4 months and advised him to notify us if symptoms worsen prior to that time. Favor repeat coronary CTA if symptoms worsen.   Dyspnea on exertion: Presented with increased DOE concerning for angina. Symptoms have improved on Imdur 30 mg daily and metoprolol 50 mg twice daily. As noted above, consider further ischemic evaluation if symptoms worsen prior to next office visit.  Hyperlipidemia LDL goal < 70/Hypertriglyceridemia: LDL 171, triglycerides 110 on 02/09/2023.  Reports he has improved consistency with taking statin. We will recheck lipid/CMET in 2 months.  Continue atorvastatin.  Essential hypertension: BP is well controlled.  SVT: No symptoms of SVT. Continue metoprolol.   Disposition: 4 months with Dr. Mayford Knife     Medication Adjustments/Labs and Tests Ordered: Current medicines are reviewed at length with the patient today.  Concerns regarding medicines are outlined  above.  Orders Placed This Encounter  Procedures   Lipid Profile   Comp Met (CMET)   Meds ordered this encounter  Medications   metoprolol tartrate (LOPRESSOR) 50 MG tablet    Sig: Take 1 tablet (50 mg total) by mouth 2 (two) times daily.    Dispense:  180 tablet    Refill:  3    Patient Instructions  Medication Instructions:   INCREASE Lopressor one (1) tablet by mouth ( 50 mg) twice daily.  You can take two (2) of you ( 25 mg) tablets at the same time twice day to use them up.   *If you need a refill on your cardiac medications before your next appointment, please call your pharmacy*   Lab Work:  Your physician recommends that you return for a FASTING lipid profile/cmet on Wednesday, July 17. You can come in on the day of your appointment anytime between 7:30-4:30 fasting from midnight the night before.    If you have labs (blood work) drawn today and your tests are completely normal, you will receive your results only by: MyChart Message (if you have MyChart) OR A paper copy in the mail If you have any lab test that is abnormal or we need to change your treatment, we will call you to review the results.   Testing/Procedures:  None ordered.   Follow-Up: At [facility], you and your health needs are our priority.  As part of our continuing mission to provide you with exceptional heart care, we have created designated Provider Care Teams.  These Care Teams include your primary Cardiologist (physician) and Advanced Practice Providers (APPs -  Physician Assistants and Nurse Practitioners) who all work together to provide you with the care you need, when you need it.  We recommend signing up for the patient portal called "MyChart".  Sign up information is provided on this After Visit Summary.  MyChart is used to connect with patients for Virtual Visits (Telemedicine).  Patients are able to view lab/test results, encounter notes, upcoming appointments, etc.  Non-urgent  messages can be sent to your provider as well.   To learn more about what you can do with MyChart, go to [email or web address].    Your next appointment:   4 month(s)  Provider:   Armanda Magic, MD        Signed, Levi Aland, NP  03/08/2023 12:47 PM    [facility]

## 2023-04-19 ENCOUNTER — Ambulatory Visit: Payer: Medicare HMO | Attending: Nurse Practitioner

## 2023-04-19 DIAGNOSIS — I471 Supraventricular tachycardia, unspecified: Secondary | ICD-10-CM | POA: Diagnosis not present

## 2023-04-19 DIAGNOSIS — R0609 Other forms of dyspnea: Secondary | ICD-10-CM | POA: Diagnosis not present

## 2023-04-19 DIAGNOSIS — E785 Hyperlipidemia, unspecified: Secondary | ICD-10-CM | POA: Diagnosis not present

## 2023-04-19 DIAGNOSIS — I25118 Atherosclerotic heart disease of native coronary artery with other forms of angina pectoris: Secondary | ICD-10-CM | POA: Diagnosis not present

## 2023-04-20 LAB — COMPREHENSIVE METABOLIC PANEL
ALT: 20 IU/L (ref 0–44)
AST: 22 IU/L (ref 0–40)
Albumin: 4.5 g/dL (ref 3.9–4.9)
Alkaline Phosphatase: 58 IU/L (ref 44–121)
BUN/Creatinine Ratio: 14 (ref 10–24)
BUN: 13 mg/dL (ref 8–27)
Bilirubin Total: 0.5 mg/dL (ref 0.0–1.2)
CO2: 25 mmol/L (ref 20–29)
Calcium: 9.2 mg/dL (ref 8.6–10.2)
Chloride: 100 mmol/L (ref 96–106)
Creatinine, Ser: 0.92 mg/dL (ref 0.76–1.27)
Globulin, Total: 2 g/dL (ref 1.5–4.5)
Glucose: 99 mg/dL (ref 70–99)
Potassium: 3.9 mmol/L (ref 3.5–5.2)
Sodium: 137 mmol/L (ref 134–144)
Total Protein: 6.5 g/dL (ref 6.0–8.5)
eGFR: 92 mL/min/{1.73_m2} (ref >59)

## 2023-04-20 LAB — LIPID PANEL
Chol/HDL Ratio: 2.4 ratio (ref 0.0–5.0)
Cholesterol, Total: 125 mg/dL (ref 100–199)
HDL: 53 mg/dL (ref >39)
LDL Chol Calc (NIH): 54 mg/dL (ref 0–99)
Triglycerides: 93 mg/dL (ref 0–149)
VLDL Cholesterol Cal: 18 mg/dL (ref 5–40)

## 2023-04-27 ENCOUNTER — Other Ambulatory Visit: Payer: Self-pay | Admitting: Cardiology

## 2023-07-12 ENCOUNTER — Ambulatory Visit: Payer: Medicare HMO | Attending: Cardiology | Admitting: Cardiology

## 2023-07-12 ENCOUNTER — Encounter: Payer: Self-pay | Admitting: Cardiology

## 2023-07-12 VITALS — BP 162/100 | HR 82 | Ht 70.0 in | Wt 192.6 lb

## 2023-07-12 DIAGNOSIS — E785 Hyperlipidemia, unspecified: Secondary | ICD-10-CM

## 2023-07-12 DIAGNOSIS — I10 Essential (primary) hypertension: Secondary | ICD-10-CM | POA: Diagnosis not present

## 2023-07-12 DIAGNOSIS — I25118 Atherosclerotic heart disease of native coronary artery with other forms of angina pectoris: Secondary | ICD-10-CM | POA: Diagnosis not present

## 2023-07-12 DIAGNOSIS — R0602 Shortness of breath: Secondary | ICD-10-CM | POA: Diagnosis not present

## 2023-07-12 DIAGNOSIS — I471 Supraventricular tachycardia, unspecified: Secondary | ICD-10-CM | POA: Diagnosis not present

## 2023-07-12 MED ORDER — ISOSORBIDE MONONITRATE ER 60 MG PO TB24: 60.0000 mg | ORAL_TABLET | Freq: Every day | ORAL | 3 refills | Status: DC

## 2023-07-12 MED ORDER — METOPROLOL SUCCINATE ER 100 MG PO TB24: 100.0000 mg | ORAL_TABLET | Freq: Every day | ORAL | 3 refills | Status: DC

## 2023-07-12 NOTE — Patient Instructions (Signed)
Medication Instructions:  STOP lopressor.  START taking Toprol XL 100 mg daily.  INCREASE your Imdur dose to 60 mg daily.  *If you need a refill on your cardiac medications before your next appointment, please call your pharmacy*   Lab Work: None.  If you have labs (blood work) drawn today and your tests are completely normal, you will receive your results only by: MyChart Message (if you have MyChart) OR A paper copy in the mail If you have any lab test that is abnormal or we need to change your treatment, we will call you to review the results.   Testing/Procedures: Your physician has requested that you have an echocardiogram. Echocardiography is a painless test that uses sound waves to create images of your heart. It provides your doctor with information about the size and shape of your heart and how well your heart's chambers and valves are working. This procedure takes approximately one hour. There are no restrictions for this procedure. Please do NOT wear cologne, perfume, aftershave, or lotions (deodorant is allowed). Please arrive 15 minutes prior to your appointment time.    Follow-Up: At [facility], you and your health needs are our priority.  As part of our continuing mission to provide you with exceptional heart care, we have created designated Provider Care Teams.  These Care Teams include your primary Cardiologist (physician) and Advanced Practice Providers (APPs -  Physician Assistants and Nurse Practitioners) who all work together to provide you with the care you need, when you need it.  We recommend signing up for the patient portal called "MyChart".  Sign up information is provided on this After Visit Summary.  MyChart is used to connect with patients for Virtual Visits (Telemedicine).  Patients are able to view lab/test results, encounter notes, upcoming appointments, etc.  Non-urgent messages can be sent to your provider as well.   To learn more about what  you can do with MyChart, go to [email or web address].    Your next appointment:   2 week(s)  Provider:   Eligha Bridegroom, NP     Then, Armanda Magic, MD will plan to see you again in 4 month(s).

## 2023-07-12 NOTE — Progress Notes (Signed)
Date:  07/12/2023   ID:  Erik Crosby, DOB 1956/09/27, MRN [id number]   PCP:  Erik Kill, PA-C  Cardiologist:  Erik Magic, MD Electrophysiologist:  None   Chief Complaint:  CAD, HTN, HLD  History of Present Illness:    Erik Crosby is a 67 y.o. male with a history of hypertension, GERD with Barrett's esophagus and dysplasia and fm hx of premature CAD.  He was referred to me for evaluation of DOE and chest pain.   He has no history of tobacco use but does have a family history of CAD although at a later age in life.  His father died of an MI at 15 years of age.  He saw me back in March for evaluation of CP which he felt was similar to the CP he had with his non-Hodgkin's lymphoma in the past but CT a year ago showed no reoccurrence of dz but did show coronary artery calcifications.  His CP was exertional and relieved with rest. He was supposed to get an ETT done but due to COVID this was cancelled. He ultimately had a coronary CTA 08/2019 that showed  ASCAD with coronary Ca score of 120 and mild CAD of the LAD with 25-49% proximal and mid stenosis and coronary FFR with no hemodynamically flow limiting lesions.   He also has a history of SVT in 2021 in the setting of chest pain was found to be SVT 890 bpm which terminated with adenosine.  TSH was normal potassium was low.  He has not had any further episodes of SVT on follow-up office visit with Erik Bridegroom, NP.  In May 2024 he saw Erik Bridegroom, NP and reported shortness of breath and chest pain.  He was started on GDMT with Imdur 30 mg daily and Lopressor 25 mg twice daily with as needed sublingual nitroglycerin.  At follow-up office visit he was doing much better and chest pain had significantly reduced.  He did note that if his heart rate went fast he would have chest pain.  He subsequently increased his Lopressor on his own.  His shortness of breath did improve.  Recommendations were for further ischemic workup  if symptoms worsened.  He is here today for followup and is doing well.  He denies any chest pain or pressure, SOB, DOE, PND, orthopnea, LE edema, dizziness, palpitations or syncope. He is compliant with his meds and is tolerating meds with no SE.    Prior CV studies:   The following studies were reviewed today:  Coronary Ca score Coronary CTA 08/2020 Aorta:  Normal size.  Aortic atherosclerosis.  No dissection.   Aortic Valve:  Trileaflet.  No calcifications.   Coronary Arteries:  Normal coronary origin.  Right dominance.   RCA is a large dominant artery that gives rise to PDA and PLVB. There is minimal calcified plaque in the proximal to mid RCA with associated stenosis of 0-24%.   Left main is a large artery that gives rise to LAD and LCX arteries. There is no plaque.   LAD is a large vessel that gives rise to a large 1st diagonal and a second large branding 2nd diagonal. There is mild calcified plaque in the proximal LAD at the takeoff of D1 with associated stenosis of 25-49%. There is mild calcified plaque in the mid LAD with associated stenosis of 25-49%. The distal LAD is poorly visualized.   LCX is a non-dominant artery that gives rise to one large OM1 branch. There  is no plaque.   Other findings:   Normal pulmonary vein drainage into the left atrium.   Normal left atrial appendage without a thrombus.   Normal size of the pulmonary artery.   IMPRESSION: 1. Coronary calcium score of 120. This was 66th percentile for age and sex matched control.   2. Normal coronary origin with right dominance.   3. There is mild atherosclerosis of the proximal and mid LAD. CAD-RADS 2.   4. Consider non cardiac causes of chest pain.   5. This study has been sent for [facility] analysis due to inability to adequately visualize the distal LAD.    Past Medical History:  Diagnosis Date   AK (actinic keratosis)    Barrett's esophagus without dysplasia    Bone pain 06/23/2014   CAD  (coronary artery disease), native coronary artery    coronary CTA 2021 with 25-49% proximal and mid LAD stenosis and coronary Ca score of 120   Cancer (HCC)    Constipation 07/11/2014   Follicular lymphoma grade 3a (HCC) 06/12/2014   GERD (gastroesophageal reflux disease)    occ-takes vinagar   Headache 07/11/2014   Hypertension    Insomnia 06/18/2014   Other fatigue 03/04/2015   Spinal headache 07/11/2014   SVT (supraventricular tachycardia) (HCC) 09/2020   Wears glasses    Past Surgical History:  Procedure Laterality Date   APPENDECTOMY     COLONOSCOPY     LYMPH NODE BIOPSY Right 06/03/2014   Procedure: RIGHT SUPRACLAVICULAR LYMPH NODE BIOPSY;  Surgeon: Adolph Pollack, MD;  Location: [facility];  Service: General;  Laterality: Right;   PORTACATH PLACEMENT N/A 06/17/2014   Procedure: ULTRASOUND GUIDED PORT-A-CATH INSERTION;  Surgeon: Avel Peace, MD;  Location: [facility] OR;  Service: General;  Laterality: N/A;     Current Meds  Medication Sig   atorvastatin (LIPITOR) 80 MG tablet TAKE 1 TABLET BY MOUTH EVERY DAY   cholecalciferol (VITAMIN D) 1000 UNITS tablet Take 2,000 Units by mouth daily.    ezetimibe (ZETIA) 10 MG tablet TAKE 1 TABLET BY MOUTH EVERY DAY   ibuprofen (ADVIL) 200 MG tablet Take 1 tablet (200 mg total) by mouth every 6 (six) hours as needed.   lisinopril (ZESTRIL) 20 MG tablet TAKE 1 TABLET BY MOUTH EVERY DAY   melatonin 5 MG TABS Take 2 tablets (10 mg total) by mouth at bedtime as needed.   metoprolol tartrate (LOPRESSOR) 50 MG tablet Take 1 tablet (50 mg total) by mouth 2 (two) times daily.   nitroGLYCERIN (NITROSTAT) 0.4 MG SL tablet Place 1 tablet (0.4 mg total) under the tongue every 5 (five) minutes as needed for chest pain (for maximum of 3 pills for one episode).   omeprazole (PRILOSEC) 20 MG capsule Take 2 capsules (40 mg total) by mouth 2 (two) times daily before a meal. (Patient taking differently: Take 40 mg by mouth daily.)     Allergies:    Patient has no known allergies.   Social History   Tobacco Use   Smoking status: Never   Smokeless tobacco: Never  Vaping Use   Vaping status: Never Used  Substance Use Topics   Alcohol use: Yes    Alcohol/week: 21.0 standard drinks of alcohol    Types: 21 Shots of liquor per week   Drug use: Yes    Types: Marijuana    Comment: Pt states that he eats marijuana cookies      Family Hx: The patient's family history includes Cancer in his mother;  Colon cancer in an other family member; Diabetes in an other family member; Heart disease in his father; Hypertension in an other family member.  ROS:   Please see the history of present illness.     All other systems reviewed and are negative.   Labs/Other Tests and Data Reviewed:    Recent Labs: 02/09/2023: Hemoglobin 10.6; Platelets 333 04/19/2023: ALT 20; BUN 13; Creatinine, Ser 0.92; Potassium 3.9; Sodium 137   Recent Lipid Panel Lab Results  Component Value Date/Time   CHOL 125 04/19/2023 09:03 AM   TRIG 93 04/19/2023 09:03 AM   HDL 53 04/19/2023 09:03 AM   CHOLHDL 2.4 04/19/2023 09:03 AM   CHOLHDL 6.2 09/01/2020 01:45 PM   LDLCALC 54 04/19/2023 09:03 AM   LDLDIRECT 181.3 (H) 09/01/2020 01:45 PM    Wt Readings from Last 3 Encounters:  07/12/23 192 lb 9.6 oz (87.4 kg)  03/08/23 189 lb 3.2 oz (85.8 kg)  02/09/23 186 lb (84.4 kg)     Objective:    Vital Signs:  BP (!) 162/100   Pulse 82   Ht 5\' 10"  (1.778 m)   Wt 192 lb 9.6 oz (87.4 kg)   SpO2 99%   BMI 27.64 kg/m    GEN: Well nourished, well developed in no acute distress HEENT: Normal NECK: No JVD; No carotid bruits LYMPHATICS: No lymphadenopathy CARDIAC:RRR, no murmurs, rubs, gallops RESPIRATORY:  Clear to auscultation without rales, wheezing or rhonchi  ABDOMEN: Soft, non-tender, non-distended MUSCULOSKELETAL:  No edema; No deformity  SKIN: Warm and dry NEUROLOGIC:  Alert and oriented x 3 PSYCHIATRIC:  Normal affect   ASSESSMENT & PLAN:    1.  ASCAD  with stable chronic angina -coronary Ca score elevated at 120 with 25-49% proximal and mid LAD stenosis -he had an episode of CP in Dec 2021 in setting of SVT with HR 190's that resolved with termination of SVT and no further episodes since then. hsTrop was elevated likely due to demand ischemia -Lexiscan Myoview 2022 showed no ischemia -His anginal symptoms are well-controlled on-long-acting nitrates and beta-blocker but when his meds wear off he has recurrent CP and SOB.  He takes his Lopressor and Imdur at 5:30am and then around 1pm he starts to feel that it is wearing off and when he exerts himself he gets a twinge of CP.  He then will split his other Lopressor in half and take 25mg  then and the other 25mg  before bed at 8pm.  -Continue prescription drug management with Imdur 30 mg daily, Lopressor 50 mg twice daily, aspirin 81 mg daily and atorvastatin 80 mg daily with as needed refills -I am going to change him to Toprol XL 100mg  daily to try to get better 24 hour coverage -increase Imdur to 60mg  daily  2.  HTN -BP is elevated today -BP at home runs 145/61mmHg -change Lopressor to Toprol XL 100mg  daily -increase Imdur to 60mg  daily and continue Zestril 20mg  daily  -I encouraged him to avoid Na in the diet>he has been eating a lot of pickles lately with a lot of Na in them  3.  SOB -suspect that SOB is related to deconditioning in the setting of COVID as well as underlying CAD and poorly controlled BP.  His  DOE has actually improved with addition of beta-blocker and long-acting nitrates -he saw pulmonary and felt not to be a lung problem -coronary CTA 08/2019 that showed  ASCAD with coronary Ca score of 120 and mild CAD of the LAD with 25-49%  proximal and mid stenosis and coronary FFR with no hemodynamically flow limiting lesions.  -repeat 2D echo  4.  HLD -his LDL goal is < 70  -I have personally reviewed and interpreted outside labs performed by patient's PCP which showed LDL 54 and  HDL 53 and ALT 22 on 04/19/2023 -Continue atorvastatin 80 mg daily Zetia 10 mg daily with as needed refills  5.  SVT -He has had no further episodes -Continue BB    Medication Adjustments/Labs and Tests Ordered: Current medicines are reviewed at length with the patient today.  Concerns regarding medicines are outlined above.  Tests Ordered: No orders of the defined types were placed in this encounter.  Medication Changes: No orders of the defined types were placed in this encounter.   Disposition:  Follow up with Erik Bridegroom, NP in 2 weeks and with me in 4 months  Signed, Erik Magic, MD  07/12/2023 3:37 PM    [facility]

## 2023-07-12 NOTE — Addendum Note (Signed)
Addended by: Luellen Pucker on: 07/12/2023 03:54 PM   Modules accepted: Orders

## 2023-08-02 ENCOUNTER — Ambulatory Visit (HOSPITAL_COMMUNITY): Payer: Medicare HMO | Attending: Cardiology

## 2023-08-02 DIAGNOSIS — R0602 Shortness of breath: Secondary | ICD-10-CM | POA: Insufficient documentation

## 2023-08-02 LAB — ECHOCARDIOGRAM COMPLETE
Area-P 1/2: 3.78 cm2
MV M vel: 5.88 m/s
MV Peak grad: 138.3 mm[Hg]
P 1/2 time: 554 ms
S' Lateral: 2.2 cm

## 2023-08-03 ENCOUNTER — Telehealth: Payer: Self-pay

## 2023-08-03 NOTE — Telephone Encounter (Signed)
Call to patient, reviewed echo results. Patient verbalizes understanding of normal pumping function as well as mildly leaky mitral and aortic valves with some aortic valve calcifications.

## 2023-08-03 NOTE — Telephone Encounter (Signed)
-----   Message from Armanda Magic sent at 08/02/2023  1:11 PM EDT ----- Normal pumping function of heart muscle, mildly leaky mitral and aortic valves with some aortic valve calcifications.  Ascending aorta read out as mildly enlarged but when indexed for body surface area it is normal

## 2023-08-11 NOTE — Progress Notes (Unsigned)
Cardiology Office Note:  .   Date:  08/11/2023  ID:  Erik Crosby, DOB 1956-04-19, MRN [id number] PCP: Erik Crosby  [facility] Providers Cardiologist:  Erik Magic, MD { Click to update primary MD,subspecialty MD or APP then REFRESH:1}   Patient Profile: .      PMH Coronary artery disease Coronary CTA 08/23/2019 Elevated coronary calcium score of 120 (66th percentile)  Mild atherosclerosis of proximal and mid LAD Hypertension Hyperlipidemia GERD with Barrett's esophagus SVT Mitral valve regurgitation Mild MR on TTE 08/02/23 Aortic insufficiency Mild AI on TTE 08/02/23 Ascending aortic dilatation Mild dilatation of ascending aorta at 40 mm on TTE 08/02/23  Coronary CTA 08/2019 revealed coronary calcium score of 120 and mild atherosclerosis of LAD with 25 to 49% proximal and mid stenosis, FFR with no hemodynamically flow-limiting lesions.  On 09/02/2019 he was transported to [facility] ED after calling EMS for chest pain which had been ongoing for 2 days, worse with exertion.  He was found to be in SVT with heart rates up to the 190s.  He was given adenosine 6 mg x 1 with improvement in HR to low 100s.  His symptoms of chest discomfort subsequently improved after adenosine.  EKG revealed no concerns for ischemia, TSH was normal, potassium was low at 3.2.  High-sensitivity troponin mildly elevated at 62.  Seen in follow-up 10/06/2020 by Dr. Mayford Crosby at which time he reported no further episodes of SVT.  He was walking 14,000 steps daily, exercising and unloading trucks at work with no concerning cardiac symptoms.  Lexiscan Myoview was ordered to rule out ischemia and was a low risk study.  He had very elevated triglycerides at 574, LDL 181.  He had stopped his statin for a while but resumed atorvastatin 80 mg.  Seen by me on 12/24/2021 with symptoms concerning for angina most notably when climbing stairs.  Reported "some days I feel like I could run a marathon and  other days of struggle to make it up the stairs.".  He reported decreased activity since retirement and admitted to frequently not drinking enough water.  At follow-up visit 02/09/2023 he reported continued episodes of chest pain and shortness of breath with exertion with elevated HR up to 130s.  He was working at [employer] part-time which was a very active job.  Symptoms improved somewhat with better hydration but still continued.  He saw pulmonology with no obvious cause of shortness of breath.  Plan discussed with Dr. Excell Crosby, DOD, and we started GDMT including Imdur 30 mg daily and metoprolol 25 mg twice daily.  He self increased Lopressor and symptoms resolved.  Seen in follow-up by Dr. Mayford Crosby 07/12/2023.  He reported symptoms would return around 1 PM after taking medications at 5:30 AM.  He was changed to Toprol-XL 100 mg daily to try to get better 24-hour coverage of symptoms.  He reported no further episodes of SVT.  He had improved cholesterol results on atorvastatin 80 mg daily and Zetia 10 mg daily.       History of Present Illness: .   [name] is a *** 67 y.o. male who is here for 2 week follow-up of angina   Discussed the use of AI scribe software for clinical note transcription with the patient, who gave verbal consent to proceed.   ROS: ***       Studies Reviewed: .        *** Risk Assessment/Calculations:   {Does this patient have ATRIAL  FIBRILLATION?[phone]} No BP recorded.  {Refresh Note OR Click here to enter BP  :1}***       Physical Exam:   VS:  There were no vitals taken for this visit.   Wt Readings from Last 3 Encounters:  07/12/23 192 lb 9.6 oz (87.4 kg)  03/08/23 189 lb 3.2 oz (85.8 kg)  02/09/23 186 lb (84.4 kg)    GEN: Well nourished, well developed in no acute distress NECK: No JVD; No carotid bruits CARDIAC: ***RRR, no murmurs, rubs, gallops RESPIRATORY:  Clear to auscultation without rales, wheezing or rhonchi  ABDOMEN: Soft, non-tender,  non-distended EXTREMITIES:  No edema; No deformity     ASSESSMENT AND PLAN: .    CAD: Symptoms of angina improved with BB and Imdur. BB was changed from metoprolol tartrate to metoprolol succinate 100 mg daily at last office visit Hypertension: Hyperlipidemia: Lipid panel 04/19/2023 revealed LDL C54, HDL 53, triglycerides 93, total cholesterol 125.    {Are you ordering a CV Procedure (e.g. stress test, cath, DCCV, TEE, etc)?   Press F2        :[id number]}  Dispo: ***  Signed, Eligha Bridegroom, NP-C

## 2023-08-15 ENCOUNTER — Other Ambulatory Visit: Payer: Self-pay | Admitting: *Deleted

## 2023-08-15 ENCOUNTER — Encounter: Payer: Self-pay | Admitting: Nurse Practitioner

## 2023-08-15 ENCOUNTER — Ambulatory Visit: Payer: Medicare HMO | Attending: Nurse Practitioner | Admitting: Nurse Practitioner

## 2023-08-15 VITALS — BP 144/80 | HR 75 | Ht 70.0 in | Wt 188.0 lb

## 2023-08-15 DIAGNOSIS — I340 Nonrheumatic mitral (valve) insufficiency: Secondary | ICD-10-CM | POA: Diagnosis not present

## 2023-08-15 DIAGNOSIS — E785 Hyperlipidemia, unspecified: Secondary | ICD-10-CM

## 2023-08-15 DIAGNOSIS — I10 Essential (primary) hypertension: Secondary | ICD-10-CM | POA: Diagnosis not present

## 2023-08-15 DIAGNOSIS — I471 Supraventricular tachycardia, unspecified: Secondary | ICD-10-CM | POA: Diagnosis not present

## 2023-08-15 DIAGNOSIS — I25118 Atherosclerotic heart disease of native coronary artery with other forms of angina pectoris: Secondary | ICD-10-CM

## 2023-08-15 DIAGNOSIS — I351 Nonrheumatic aortic (valve) insufficiency: Secondary | ICD-10-CM

## 2023-08-15 DIAGNOSIS — R0602 Shortness of breath: Secondary | ICD-10-CM

## 2023-08-15 MED ORDER — METOPROLOL TARTRATE 50 MG PO TABS: ORAL_TABLET | ORAL | 3 refills | Status: DC

## 2023-08-15 MED ORDER — METOPROLOL TARTRATE 25 MG PO TABS: ORAL_TABLET | ORAL | 3 refills | Status: DC

## 2023-08-15 NOTE — Patient Instructions (Signed)
Medication Instructions:   Take Lopressor one ( 1) tablet by mouth ( 50 mg) in the am, take one (1) tablet by mouth at 1:00 pm and take one (1) tablet by mouth ( 25 mg) at bedtime.   *If you need a refill on your cardiac medications before your next appointment, please call your pharmacy*   Lab Work:  Your physician recommends that you return for a FASTING lipid profile on Friday, February 7. You can come in on the day of your appointment anytime between 7:30 am -4:30 pm fasting from midnight the night before. Closed from 12:30-1:30 for lunch.  Paper work given to patient today.   If you have labs (blood work) drawn today and your tests are completely normal, you will receive your results only by: MyChart Message (if you have MyChart) OR A paper copy in the mail If you have any lab test that is abnormal or we need to change your treatment, we will call you to review the results.   Testing/Procedures:  None ordered.   Follow-Up: At [facility], you and your health needs are our priority.  As part of our continuing mission to provide you with exceptional heart care, we have created designated Provider Care Teams.  These Care Teams include your primary Cardiologist (physician) and Advanced Practice Providers (APPs -  Physician Assistants and Nurse Practitioners) who all work together to provide you with the care you need, when you need it.  We recommend signing up for the patient portal called "MyChart".  Sign up information is provided on this After Visit Summary.  MyChart is used to connect with patients for Virtual Visits (Telemedicine).  Patients are able to view lab/test results, encounter notes, upcoming appointments, etc.  Non-urgent messages can be sent to your provider as well.   To learn more about what you can do with MyChart, go to [email or web address].    Your next appointment:   3 month(s)  Provider:   Armanda Magic, MD     Other Instructions  HOW TO TAKE  YOUR BLOOD PRESSURE  Rest 5 minutes before taking your blood pressure. Don't  smoke or drink caffeinated beverages for at least 30 minutes before. Take your blood pressure before (not after) you eat. Sit comfortably with your back supported and both feet on the floor ( don't cross your legs). Elevate your arm to heart level on a table or a desk. Use the proper sized cuff.  It should fit smoothly and snugly around your bare upper arm.  There should be  Enough room to slip a fingertip under the cuff.  The bottom edge of the cuff should be 1 inch above the crease Of the elbow. Please monitor your blood pressure once daily 2 hours after your am medication. If you blood pressure Consistently remains above 130 (systolic) top number or over 80 ( diastolic) bottom number X 3 days  Consecutively.  Please call our office at [phone] or send Mychart message.     ----Avoid cold medicines with D or DM at the end of them----        Mediterranean Diet  Why follow it? Research shows. Those who follow the Mediterranean diet have a reduced risk of heart disease  The diet is associated with a reduced incidence of Parkinson's and Alzheimer's diseases People following the diet may have longer life expectancies and lower rates of chronic diseases  The Dietary Guidelines for Americans recommends the Mediterranean diet as an eating plan to promote health  and prevent disease  What Is the Mediterranean Diet?  Healthy eating plan based on typical foods and recipes of Mediterranean-style cooking The diet is primarily a plant based diet; these foods should make up a majority of meals   Starches - Plant based foods should make up a majority of meals - They are an important sources of vitamins, minerals, energy, antioxidants, and fiber - Choose whole grains, foods high in fiber and minimally processed items  - Typical grain sources include wheat, oats, barley, corn, brown rice, bulgar, farro, millet, polenta,  couscous  - Various types of beans include chickpeas, lentils, fava beans, black beans, white beans   Fruits  Veggies - Large quantities of antioxidant rich fruits & veggies; 6 or more servings  - Vegetables can be eaten raw or lightly drizzled with oil and cooked  - Vegetables common to the traditional Mediterranean Diet include: artichokes, arugula, beets, broccoli, brussel sprouts, cabbage, carrots, celery, collard greens, cucumbers, eggplant, kale, leeks, lemons, lettuce, mushrooms, okra, onions, peas, peppers, potatoes, pumpkin, radishes, rutabaga, shallots, spinach, sweet potatoes, turnips, zucchini - Fruits common to the Mediterranean Diet include: apples, apricots, avocados, cherries, clementines, dates, figs, grapefruits, grapes, melons, nectarines, oranges, peaches, pears, pomegranates, strawberries, tangerines  Fats - Replace butter and margarine with healthy oils, such as olive oil, canola oil, and tahini  - Limit nuts to no more than a handful a day  - Nuts include walnuts, almonds, pecans, pistachios, pine nuts  - Limit or avoid candied, honey roasted or heavily salted nuts - Olives are central to the [employer] - can be eaten whole or used in a variety of dishes   Meats Protein - Limiting red meat: no more than a few times a month - When eating red meat: choose lean cuts and keep the portion to the size of deck of cards - Eggs: approx. 0 to 4 times a week  - Fish and lean poultry: at least 2 a week  - Healthy protein sources include, chicken, [place], lean beef, lamb - Increase intake of seafood such as tuna, salmon, trout, mackerel, shrimp, scallops - Avoid or limit high fat processed meats such as sausage and bacon  Dairy - Include moderate amounts of low fat dairy products  - Focus on healthy dairy such as fat free yogurt, skim milk, low or reduced fat cheese - Limit dairy products higher in fat such as whole or 2% milk, cheese, ice cream  Alcohol - Moderate amounts of  red wine is ok  - No more than 5 oz daily for women (all ages) and men older than age 66  - No more than 10 oz of wine daily for men younger than 25  Other - Limit sweets and other desserts  - Use herbs and spices instead of salt to flavor foods  - Herbs and spices common to the traditional Mediterranean Diet include: basil, bay leaves, chives, cloves, cumin, fennel, garlic, lavender, marjoram, mint, oregano, parsley, pepper, rosemary, sage, savory, sumac, tarragon, thyme   It's not just a diet, it's a lifestyle:  The Mediterranean diet includes lifestyle factors typical of those in the region  Foods, drinks and meals are best eaten with others and savored Daily physical activity is important for overall good health This could be strenuous exercise like running and aerobics This could also be more leisurely activities such as walking, housework, yard-work, or taking the stairs Moderation is the key; a balanced and healthy diet accommodates most foods and drinks Consider portion  sizes and frequency of consumption of certain foods   Meal Ideas & Options:  Breakfast:  Whole wheat toast or whole wheat English muffins with peanut butter & hard boiled egg Steel cut oats topped with apples & cinnamon and skim milk  Fresh fruit: banana, strawberries, melon, berries, peaches  Smoothies: strawberries, bananas, greek yogurt, peanut butter Low fat greek yogurt with blueberries and granola  Egg white omelet with spinach and mushrooms Breakfast couscous: whole wheat couscous, apricots, skim milk, cranberries  Sandwiches:  Hummus and grilled vegetables (peppers, zucchini, squash) on whole wheat bread   Grilled chicken on whole wheat pita with lettuce, tomatoes, cucumbers or tzatziki  [place] salad on whole wheat bread: tuna salad made with greek yogurt, olives, red peppers, capers, green onions Garlic rosemary lamb pita: lamb sauted with garlic, rosemary, salt & pepper; add lettuce, cucumber, greek  yogurt to pita - flavor with lemon juice and black pepper  Seafood:  Mediterranean grilled salmon, seasoned with garlic, basil, parsley, lemon juice and black pepper Shrimp, lemon, and spinach whole-grain pasta salad made with low fat greek yogurt  Seared scallops with lemon orzo  Seared tuna steaks seasoned salt, pepper, coriander topped with tomato mixture of olives, tomatoes, olive oil, minced garlic, parsley, green onions and cappers  Meats:  Herbed greek chicken salad with kalamata olives, cucumber, feta  Red bell peppers stuffed with spinach, bulgur, lean ground beef (or lentils) & topped with feta   Kebabs: skewers of chicken, tomatoes, onions, zucchini, squash  [place] burgers: made with red onions, mint, dill, lemon juice, feta cheese topped with roasted red peppers Vegetarian Cucumber salad: cucumbers, artichoke hearts, celery, red onion, feta cheese, tossed in olive oil & lemon juice  Hummus and whole grain pita points with a greek salad (lettuce, tomato, feta, olives, cucumbers, red onion) Lentil soup with celery, carrots made with vegetable broth, garlic, salt and pepper  Tabouli salad: parsley, bulgur, mint, scallions, cucumbers, tomato, radishes, lemon juice, olive oil, salt and pepper.   Adopting a Healthy Lifestyle.   Weight: Know what a healthy weight is for you (roughly BMI <25) and aim to maintain this. You can calculate your body mass index on your smart phone. Unfortunately, this is not the most accurate measure of healthy weight, but it is the simplest measurement to use. A more accurate measurement involves body scanning which measures lean muscle, fat tissue and bony density. We do not have this equipment at [facility].    Diet: Aim for 7+ servings of fruits and vegetables daily Limit animal fats in diet for cholesterol and heart health - choose grass fed whenever available Avoid highly processed foods (fast food burgers, tacos, fried chicken, pizza, hot dogs, french  fries)  Saturated fat comes in the form of butter, lard, coconut oil, margarine, partially hydrogenated oils, and fat in meat. These increase your risk of cardiovascular disease.  Use healthy plant oils, such as olive, canola, soy, corn, sunflower and peanut.  Whole foods such as fruits, vegetables and whole grains have fiber  Men need > 38 grams of fiber per day Women need > 25 grams of fiber per day  Load up on vegetables and fruits - one-half of your plate: Aim for color and variety, and remember that potatoes dont count. Go for whole grains - one-quarter of your plate: Whole wheat, barley, wheat berries, quinoa, oats, brown rice, and foods made with them. If you want pasta, go with whole wheat pasta. Protein power - one-quarter of your plate: Fish,  chicken, beans, and nuts are all healthy, versatile protein sources. Limit red meat. You need carbohydrates for energy! The type of carbohydrate is more important than the amount. Choose carbohydrates such as vegetables, fruits, whole grains, beans, and nuts in the place of white rice, white pasta, potatoes (baked or fried), macaroni and cheese, cakes, cookies, and donuts.  If youre thirsty, drink water. Coffee and tea are good in moderation, but skip sugary drinks and limit milk and dairy products to one or two daily servings. Keep sugar intake at 6 teaspoons or 24 grams or LESS       Exercise: Aim for 150 min of moderate intensity exercise weekly for heart health, and weights twice weekly for bone health Stay active - any steps are better than no steps! Aim for 7-9 hours of sleep daily

## 2023-11-14 ENCOUNTER — Ambulatory Visit: Payer: Medicare HMO | Attending: Cardiology | Admitting: Cardiology

## 2023-11-14 ENCOUNTER — Encounter: Payer: Self-pay | Admitting: Cardiology

## 2023-11-14 VITALS — BP 142/82 | HR 71 | Ht 70.0 in | Wt 185.8 lb

## 2023-11-14 DIAGNOSIS — I351 Nonrheumatic aortic (valve) insufficiency: Secondary | ICD-10-CM

## 2023-11-14 DIAGNOSIS — I10 Essential (primary) hypertension: Secondary | ICD-10-CM | POA: Diagnosis not present

## 2023-11-14 DIAGNOSIS — E785 Hyperlipidemia, unspecified: Secondary | ICD-10-CM | POA: Diagnosis not present

## 2023-11-14 DIAGNOSIS — I340 Nonrheumatic mitral (valve) insufficiency: Secondary | ICD-10-CM

## 2023-11-14 DIAGNOSIS — R0602 Shortness of breath: Secondary | ICD-10-CM

## 2023-11-14 DIAGNOSIS — I25118 Atherosclerotic heart disease of native coronary artery with other forms of angina pectoris: Secondary | ICD-10-CM | POA: Diagnosis not present

## 2023-11-14 DIAGNOSIS — I471 Supraventricular tachycardia, unspecified: Secondary | ICD-10-CM | POA: Diagnosis not present

## 2023-11-14 DIAGNOSIS — Z79899 Other long term (current) drug therapy: Secondary | ICD-10-CM

## 2023-11-14 MED ORDER — VALSARTAN 160 MG PO TABS: 160.0000 mg | ORAL_TABLET | Freq: Every day | ORAL | 3 refills | Status: DC

## 2023-11-14 NOTE — Patient Instructions (Addendum)
Medication Instructions:  Stop Lisinopril Start Valsartan 160 mg once daily *If you need a refill on your cardiac medications before your next appointment, please call your pharmacy*   Lab Work: BMET in one week If you have labs (blood work) drawn today and your tests are completely normal, you will receive your results only by: MyChart Message (if you have MyChart) OR A paper copy in the mail If you have any lab test that is abnormal or we need to change your treatment, we will call you to review the results.   Testing/Procedures: None   Follow-Up: At [facility], you and your health needs are our priority.  As part of our continuing mission to provide you with exceptional heart care, we have created designated Provider Care Teams.  These Care Teams include your primary Cardiologist (physician) and Advanced Practice Providers (APPs -  Physician Assistants and Nurse Practitioners) who all work together to provide you with the care you need, when you need it.  We recommend signing up for the patient portal called "MyChart".  Sign up information is provided on this After Visit Summary.  MyChart is used to connect with patients for Virtual Visits (Telemedicine).  Patients are able to view lab/test results, encounter notes, upcoming appointments, etc.  Non-urgent messages can be sent to your provider as well.   To learn more about what you can do with MyChart, go to [email or web address].    Your next appointment:   1 year(s)  Provider:   Armanda Magic, MD     Other Instructions Blood pressure checks twice daily for 1 week. Drop off at front desk or call our operators.

## 2023-11-14 NOTE — Progress Notes (Signed)
Date:  11/14/2023   ID:  [name], DOB [birth year], MRN [id number]   PCP:  Roger Kill, PA-C  Cardiologist:  Armanda Magic, MD Electrophysiologist:  None   Chief Complaint:  CAD, HTN, HLD  History of Present Illness:    Erik Crosby is a 68 y.o. male with a history of hypertension, GERD with Barrett's esophagus and dysplasia and fm hx of premature CAD.  He was referred to me for evaluation of DOE and chest pain.   He has no history of tobacco use but does have a family history of CAD although at a later age in life.  His father died of an MI at 7 years of age.  He saw me back in March for evaluation of CP which he felt was similar to the CP he had with his non-Hodgkin's lymphoma in the past but CT a year ago showed no reoccurrence of dz but did show coronary artery calcifications.  His CP was exertional and relieved with rest. He was supposed to get an ETT done but due to COVID this was cancelled. He ultimately had a coronary CTA 08/2019 that showed  ASCAD with coronary Ca score of 120 and mild CAD of the LAD with 25-49% proximal and mid stenosis and coronary FFR with no hemodynamically flow limiting lesions.   He also has a history of SVT in 2021 in the setting of chest pain was found to be SVT 890 bpm which terminated with adenosine.  TSH was normal potassium was low.  He has not had any further episodes of SVT on follow-up office visit with Eligha Bridegroom, NP.  In May 2024 he saw Eligha Bridegroom, NP and reported shortness of breath and chest pain.  He was started on GDMT with Imdur 30 mg daily and Lopressor 25 mg twice daily with as needed sublingual nitroglycerin.  At follow-up office visit he was doing much better and chest pain had significantly reduced.  He did note that if his heart rate went fast he would have chest pain.  He subsequently increased his Lopressor on his own.  His shortness of breath did improve.  Recommendations were for further ischemic workup  if symptoms worsened.  He is here today for followup and is doing well.  He denies any anginal sx unless he forgets to take his heart meds.  He also has been having a dry cough with the lisinopril.  He denies any SOB, DOE, PND, orthopnea, LE edema, dizziness, palpitations or syncope. He is compliant with his meds and is tolerating meds with no SE.    Prior CV studies:   The following studies were reviewed today:  Coronary Ca score Coronary CTA 08/2020 Aorta:  Normal size.  Aortic atherosclerosis.  No dissection.   Aortic Valve:  Trileaflet.  No calcifications.   Coronary Arteries:  Normal coronary origin.  Right dominance.   RCA is a large dominant artery that gives rise to PDA and PLVB. There is minimal calcified plaque in the proximal to mid RCA with associated stenosis of 0-24%.   Left main is a large artery that gives rise to LAD and LCX arteries. There is no plaque.   LAD is a large vessel that gives rise to a large 1st diagonal and a second large branding 2nd diagonal. There is mild calcified plaque in the proximal LAD at the takeoff of D1 with associated stenosis of 25-49%. There is mild calcified plaque in the mid LAD with associated stenosis of 25-49%.  The distal LAD is poorly visualized.   LCX is a non-dominant artery that gives rise to one large OM1 branch. There is no plaque.   Other findings:   Normal pulmonary vein drainage into the left atrium.   Normal left atrial appendage without a thrombus.   Normal size of the pulmonary artery.   IMPRESSION: 1. Coronary calcium score of 120. This was 66th percentile for age and sex matched control.   2. Normal coronary origin with right dominance.   3. There is mild atherosclerosis of the proximal and mid LAD. CAD-RADS 2.   4. Consider non cardiac causes of chest pain.   5. This study has been sent for [facility] analysis due to inability to adequately visualize the distal LAD.    Past Medical History:  Diagnosis  Date   AK (actinic keratosis)    Barrett's esophagus without dysplasia    Bone pain 06/23/2014   CAD (coronary artery disease), native coronary artery    coronary CTA 2021 with 25-49% proximal and mid LAD stenosis and coronary Ca score of 120   Cancer (HCC)    Constipation 07/11/2014   Follicular lymphoma grade 3a (HCC) 06/12/2014   GERD (gastroesophageal reflux disease)    occ-takes vinagar   Headache 07/11/2014   Hypertension    Insomnia 06/18/2014   Other fatigue 03/04/2015   Spinal headache 07/11/2014   SVT (supraventricular tachycardia) (HCC) 09/2020   Wears glasses    Past Surgical History:  Procedure Laterality Date   APPENDECTOMY     COLONOSCOPY     LYMPH NODE BIOPSY Right 06/03/2014   Procedure: RIGHT SUPRACLAVICULAR LYMPH NODE BIOPSY;  Surgeon: Adolph Pollack, MD;  Location: [facility];  Service: General;  Laterality: Right;   PORTACATH PLACEMENT N/A 06/17/2014   Procedure: ULTRASOUND GUIDED PORT-A-CATH INSERTION;  Surgeon: Avel Peace, MD;  Location: [facility] OR;  Service: General;  Laterality: N/A;     Current Meds  Medication Sig   albuterol (VENTOLIN HFA) 108 (90 Base) MCG/ACT inhaler Inhale 2 puffs into the lungs every 6 (six) hours as needed for wheezing or shortness of breath.   atorvastatin (LIPITOR) 80 MG tablet TAKE 1 TABLET BY MOUTH EVERY DAY   cholecalciferol (VITAMIN D) 1000 UNITS tablet Take 2,000 Units by mouth daily.    ezetimibe (ZETIA) 10 MG tablet TAKE 1 TABLET BY MOUTH EVERY DAY   ibuprofen (ADVIL) 200 MG tablet Take 1 tablet (200 mg total) by mouth every 6 (six) hours as needed.   isosorbide mononitrate (IMDUR) 60 MG 24 hr tablet Take 1 tablet (60 mg total) by mouth daily.   lisinopril (ZESTRIL) 20 MG tablet TAKE 1 TABLET BY MOUTH EVERY DAY   melatonin 5 MG TABS Take 2 tablets (10 mg total) by mouth at bedtime as needed.   metoprolol tartrate (LOPRESSOR) 25 MG tablet Take one (1) tablet by mouth ( 25 mg) at 1:00 and take one (1) tablet by  mouth ( 25 mg ) at bedtime.   metoprolol tartrate (LOPRESSOR) 50 MG tablet Take one (1) tablet by mouth ( 50 mg) in the am.   nitroGLYCERIN (NITROSTAT) 0.4 MG SL tablet Place 1 tablet (0.4 mg total) under the tongue every 5 (five) minutes as needed for chest pain (for maximum of 3 pills for one episode).   omeprazole (PRILOSEC) 20 MG capsule Take 2 capsules (40 mg total) by mouth 2 (two) times daily before a meal. (Patient taking differently: Take 40 mg by mouth daily.)  Allergies:   Patient has no known allergies.   Social History   Tobacco Use   Smoking status: Never   Smokeless tobacco: Never  Vaping Use   Vaping status: Never Used  Substance Use Topics   Alcohol use: Yes    Alcohol/week: 21.0 standard drinks of alcohol    Types: 21 Shots of liquor per week   Drug use: Yes    Types: Marijuana    Comment: Pt states that he eats marijuana cookies      Family Hx: The patient's family history includes Cancer in his mother; Colon cancer in an other family member; Diabetes in an other family member; Heart disease in his father; Hypertension in an other family member.  ROS:   Please see the history of present illness.     All other systems reviewed and are negative.   Labs/Other Tests and Data Reviewed:    Recent Labs: 02/09/2023: Hemoglobin 10.6; Platelets 333 04/19/2023: ALT 20; BUN 13; Creatinine, Ser 0.92; Potassium 3.9; Sodium 137   Recent Lipid Panel Lab Results  Component Value Date/Time   CHOL 125 04/19/2023 09:03 AM   TRIG 93 04/19/2023 09:03 AM   HDL 53 04/19/2023 09:03 AM   CHOLHDL 2.4 04/19/2023 09:03 AM   CHOLHDL 6.2 09/01/2020 01:45 PM   LDLCALC 54 04/19/2023 09:03 AM   LDLDIRECT 181.3 (H) 09/01/2020 01:45 PM    Wt Readings from Last 3 Encounters:  11/14/23 185 lb 12.8 oz (84.3 kg)  08/15/23 188 lb (85.3 kg)  07/12/23 192 lb 9.6 oz (87.4 kg)     Objective:    Vital Signs:  BP (!) 142/82   Pulse 71   Ht 5\' 10"  (1.778 m)   Wt 185 lb 12.8 oz (84.3  kg)   SpO2 96%   BMI 26.66 kg/m    GEN: Well nourished, well developed in no acute distress HEENT: Normal NECK: No JVD; No carotid bruits LYMPHATICS: No lymphadenopathy CARDIAC:RRR, no murmurs, rubs, gallops RESPIRATORY:  Clear to auscultation without rales, wheezing or rhonchi  ABDOMEN: Soft, non-tender, non-distended MUSCULOSKELETAL:  No edema; No deformity  SKIN: Warm and dry NEUROLOGIC:  Alert and oriented x 3 PSYCHIATRIC:  Normal affect  ASSESSMENT & PLAN:    1.  ASCAD with stable chronic angina -coronary Ca score elevated at 120 with 25-49% proximal and mid LAD stenosis -he had an episode of CP in Dec 2021 in setting of SVT with HR 190's that resolved with termination of SVT and no further episodes since then. hsTrop was elevated likely due to demand ischemia -Lexiscan Myoview 2022 showed no ischemia -His angina has been very stable and has not required any sublingual nitroglycerin -Continue prescription drug management with atorvastatin 80 mg daily, Imdur 60 mg daily, Lopressor 50 mg every morning and 25 mg at 1 AM and 25 mg at bedtime with as needed refills   2.  HTN -BP Borderline  controlled on exam -Continue prescription drug management with Lopressor 50 mg every morning, 25 mg at 1 AM and 25 mg every afternoon with as needed refills -He has a cough with Lisinopril so will change to Valsartan 160mg  daily  -I have asked him to check his BP twice daily for a week and call with results -check BMET in 1 week -I have personally reviewed and interpreted outside labs performed by patient's PCP which showed serum creatinine 0.2 and potassium 3.9 on 04/19/2023  3.  SOB -suspect that SOB is related to deconditioning in the setting of COVID  as well as underlying CAD and poorly controlled BP.  His  DOE has actually improved with addition of beta-blocker and long-acting nitrates -he saw pulmonary and felt not to be a lung problem -coronary CTA 08/2019 that showed  ASCAD with coronary  Ca score of 120 and mild CAD of the LAD with 25-49% proximal and mid stenosis and coronary FFR with no hemodynamically flow limiting lesions.  -2D echo 07/23/2023 showed EF 55 to 60% with normal diastolic function normal RV function, mild MR and AR and mildly dilated aorta at 40 mm  4.  HLD -his LDL goal is < 70  -I have personally reviewed and interpreted outside labs performed by patient's PCP which showed LDL 54, HDL 53, triglycerides 93 and ALT 20 on 04/19/2023 -Continue prescription drug management with atorvastatin 80 mg daily and Zetia 10 mg daily with as needed refills  5.  SVT -He has had no further episodes -Continue BB  6.  Mitral regurgitation/aortic regurgitation -Mild by 2D echo 08/02/2023 -Repeat echo in 2 years    Medication Adjustments/Labs and Tests Ordered: Current medicines are reviewed at length with the patient today.  Concerns regarding medicines are outlined above.  Tests Ordered: No orders of the defined types were placed in this encounter.  Medication Changes: No orders of the defined types were placed in this encounter.   Disposition:  Follow up with me in 1 year  Signed, Armanda Magic, MD  11/14/2023 8:19 AM    [facility]

## 2023-11-14 NOTE — Addendum Note (Signed)
Addended by: Erick Alley on: 11/14/2023 08:32 AM   Modules accepted: Orders

## 2024-01-26 ENCOUNTER — Other Ambulatory Visit: Payer: Self-pay | Admitting: Nurse Practitioner

## 2024-04-22 ENCOUNTER — Other Ambulatory Visit: Payer: Self-pay | Admitting: Nurse Practitioner

## 2024-04-28 ENCOUNTER — Other Ambulatory Visit: Payer: Self-pay | Admitting: Cardiology

## 2024-04-28 ENCOUNTER — Other Ambulatory Visit: Payer: Self-pay | Admitting: Nurse Practitioner

## 2024-08-14 ENCOUNTER — Other Ambulatory Visit: Payer: Self-pay | Admitting: Nurse Practitioner

## 2024-09-27 ENCOUNTER — Other Ambulatory Visit: Payer: Self-pay | Admitting: Nurse Practitioner

## 2024-10-20 ENCOUNTER — Other Ambulatory Visit: Payer: Self-pay | Admitting: Cardiology

## 2024-10-29 NOTE — Progress Notes (Signed)
 Erik Crosby                                          MRN: [id number]   10/29/2024   The [facility] Quality Team Specialist reviewed this patient medical record for the purposes of chart review for care gap closure. The following were reviewed: chart review for care gap closure-controlling blood pressure.    [facility] Quality Team
